# Patient Record
Sex: Male | Born: 1955
Health system: Southern US, Community
[De-identification: ages and names within clinical notes are randomized; demographics above are authoritative.]

## PROBLEM LIST (undated history)

## (undated) DIAGNOSIS — E079 Disorder of thyroid, unspecified: Secondary | ICD-10-CM

## (undated) DIAGNOSIS — I10 Essential (primary) hypertension: Secondary | ICD-10-CM

## (undated) DIAGNOSIS — E119 Type 2 diabetes mellitus without complications: Secondary | ICD-10-CM

## (undated) HISTORY — DX: Type 2 diabetes mellitus without complications: E11.9

## (undated) HISTORY — PX: HERNIA REPAIR: SHX51

## (undated) HISTORY — PX: BACK SURGERY: SHX140

## (undated) HISTORY — DX: Disorder of thyroid, unspecified: E07.9

---

## 2002-09-22 ENCOUNTER — Emergency Department (HOSPITAL_COMMUNITY): Admission: EM | Admit: 2002-09-22 | Discharge: 2002-09-22 | Payer: Self-pay | Admitting: Emergency Medicine

## 2002-09-22 ENCOUNTER — Encounter: Payer: Self-pay | Admitting: Emergency Medicine

## 2005-05-05 ENCOUNTER — Emergency Department (HOSPITAL_COMMUNITY): Admission: EM | Admit: 2005-05-05 | Discharge: 2005-05-05 | Payer: Self-pay | Admitting: Emergency Medicine

## 2005-07-04 ENCOUNTER — Emergency Department (HOSPITAL_COMMUNITY): Admission: EM | Admit: 2005-07-04 | Discharge: 2005-07-04 | Payer: Self-pay | Admitting: Emergency Medicine

## 2005-12-27 ENCOUNTER — Ambulatory Visit (HOSPITAL_COMMUNITY): Admission: RE | Admit: 2005-12-27 | Discharge: 2005-12-27 | Payer: Self-pay | Admitting: General Surgery

## 2005-12-27 ENCOUNTER — Encounter (INDEPENDENT_AMBULATORY_CARE_PROVIDER_SITE_OTHER): Payer: Self-pay | Admitting: Specialist

## 2006-04-02 ENCOUNTER — Emergency Department (HOSPITAL_COMMUNITY): Admission: EM | Admit: 2006-04-02 | Discharge: 2006-04-02 | Payer: Self-pay | Admitting: Emergency Medicine

## 2007-09-20 ENCOUNTER — Emergency Department (HOSPITAL_COMMUNITY): Admission: EM | Admit: 2007-09-20 | Discharge: 2007-09-20 | Payer: Self-pay | Admitting: Emergency Medicine

## 2008-01-11 ENCOUNTER — Emergency Department (HOSPITAL_COMMUNITY): Admission: EM | Admit: 2008-01-11 | Discharge: 2008-01-12 | Payer: Self-pay | Admitting: Emergency Medicine

## 2008-07-20 ENCOUNTER — Emergency Department (HOSPITAL_COMMUNITY): Admission: EM | Admit: 2008-07-20 | Discharge: 2008-07-20 | Payer: Self-pay | Admitting: Emergency Medicine

## 2009-09-24 ENCOUNTER — Emergency Department (HOSPITAL_COMMUNITY): Admission: EM | Admit: 2009-09-24 | Discharge: 2009-09-24 | Payer: Self-pay | Admitting: Emergency Medicine

## 2010-12-06 ENCOUNTER — Emergency Department (HOSPITAL_COMMUNITY)
Admission: EM | Admit: 2010-12-06 | Discharge: 2010-12-06 | Disposition: A | Discharge status: Home / Self Care | Payer: Self-pay | Attending: Emergency Medicine | Admitting: Emergency Medicine

## 2010-12-06 ENCOUNTER — Emergency Department (HOSPITAL_COMMUNITY): Payer: Self-pay

## 2010-12-06 DIAGNOSIS — I10 Essential (primary) hypertension: Secondary | ICD-10-CM | POA: Insufficient documentation

## 2010-12-06 DIAGNOSIS — X503XXA Overexertion from repetitive movements, initial encounter: Secondary | ICD-10-CM | POA: Insufficient documentation

## 2010-12-06 DIAGNOSIS — IMO0002 Reserved for concepts with insufficient information to code with codable children: Secondary | ICD-10-CM | POA: Insufficient documentation

## 2010-12-07 NOTE — H&P (Signed)
[name], Evans                 ACCOUNT NO.:  [id number]   MEDICAL RECORD NO.:  [id number]          PATIENT TYPE:  AMB   LOCATION:  DAY                           FACILITY:  [facility]   PHYSICIAN:  Charles Evans, M.D.   DATE OF BIRTH:  [birth year]   DATE OF ADMISSION:  DATE OF DISCHARGE:  LH                                HISTORY & PHYSICAL   HISTORY OF PRESENT ILLNESS:  A 55 year old male with history of right  inguinal swelling for at least two months.  The swelling started while the  patient was doing some lifting on his job.  He was seen by the nurse and was  told that he had a groin strain.  He has had continued swelling.  He was  seen by Dr. Ouida Sills on Dec 17, 2005 and it was noted that he had a right  inguinal hernia.  He is referred for hernia repair.   PAST MEDICAL HISTORY:  1.  History of hypertension.  2.  Osteoarthritis.   MEDICATIONS:  Hydrochlorothiazide 12.5 mg daily, but the patient does not  take it regularly.   PAST SURGICAL HISTORY:  None.   ALLERGIES:  PENICILLIN.   SOCIAL HISTORY:  The patient is married.  He is employed.  He smokes a pack  of cigarettes per day.  He denies drug and alcohol use.   FAMILY HISTORY:  Positive for hypertension and cirrhosis of the liver.   PHYSICAL EXAMINATION:  VITAL SIGNS:  Blood pressure 136/90, pulse 66,  respirations 20, weight 156 pounds.  HEENT:  Unremarkable.  NECK:  Supple.  No JVD, bruits, adenopathy, or thyromegaly.  CHEST:  Clear to auscultation.  HEART:  Regular rate and rhythm without murmur, gallop or rub.  ABDOMEN:  Soft, nontender.  No masses apparent.  Inguinal area reveals a  large right inguinal hernia.  EXTREMITIES:  No cyanosis, clubbing or edema.  NEUROLOGIC:  No focal motor, sensory or cerebellar deficits.   IMPRESSION:  1.  Right inguinal hernia.  2.  Hypertension.   PLAN:  Right inguinal hernia repair.      Charles Evans, M.D.  Electronically Signed     LCS/MEDQ  D:  12/27/2005  T:   12/27/2005  Job:  [id number]

## 2010-12-07 NOTE — Op Note (Signed)
NAMELADAVION, SAVITZ                 ACCOUNT NO.:  [id number]   MEDICAL RECORD NO.:  [id number]          PATIENT TYPE:  AMB   LOCATION:  DAY                           FACILITY:  [facility]   PHYSICIAN:  Jerolyn Shin C. Katrinka Blazing, M.D.   DATE OF BIRTH:  15-Aug-1955   DATE OF PROCEDURE:  12/27/2005  DATE OF DISCHARGE:                                 OPERATIVE REPORT   PREOPERATIVE DIAGNOSIS:  Right inguinal hernia .   POSTOPERATIVE DIAGNOSIS:  Right indirect inguinal hernia.   PROCEDURE:  Right inguinal hernia repair.   SURGEON:  Elpidio Anis, M.D.   DESCRIPTION:  Under general anesthesia, the right inguinal area was prepped  and draped in a sterile field.  A curvilinear incision was made and extended  down through the aponeurosis.  The cord was mobilized.  Inspection of the  cord was carried out, and a large sac, which communicated with the testicle,  was encountered.  The sac was very thin.  It took very meticulous dissection  to separate the sac from the cord.  The sac was dissected back to the  internal ring.  Part of it was excised.  The remaining part was doubly tied  with double 2-0 Vicryl and excised.  The sac remnant was invaginated, and a  medium plug was placed.  The medium plug was sutured with 4-0 sutures of  interrupted 0 Prolene.  An onlay patch was placed and was sutured  circumferentially to the inguinal floor with running 0 Prolene.  The cord  was placed in anatomic position.  Aponeurosis was closed with 3-0 Monocryl.  The subcutaneous tissue closed with 3-0 Monocryl.  The skin was closed with  subcuticular 4-0 Vicryl.  Local infiltration with 0.5% Marcaine with  epinephrine was carried out.  Dermabond was placed over the wound.  The  patient was awakened from anesthesia, transferred to a bed, and taken to the  post anesthesia care unit for monitoring.      Dirk Dress. Katrinka Blazing, M.D.  Electronically Signed     LCS/MEDQ  D:  12/27/2005  T:  12/28/2005  Job:  [id number]

## 2010-12-07 NOTE — Procedures (Signed)
NAMEDEMITRIOUS, MCCANNON                 ACCOUNT NO.:  [id number]   MEDICAL RECORD NO.:  [id number]          PATIENT TYPE:  AMB   LOCATION:  DAY                           FACILITY:  [facility]   PHYSICIAN:  Edward L. Juanetta Gosling, M.D.DATE OF BIRTH:  09/25/1955   DATE OF PROCEDURE:  12/27/2005  DATE OF DISCHARGE:                                EKG INTERPRETATION   The rhythm is sinus rhythm with a rate in the 60s.  There is significant  left ventricular hypertrophy by voltage criteria.  Anteriorly, there are ST  elevations which may be related to the LVH but could be due to injury, and  clinical correlation is suggested.  There may be left and right atrial  enlargement.  Abnormal electrocardiogram.      Oneal Deputy. Juanetta Gosling, M.D.  Electronically Signed     ELH/MEDQ  D:  12/27/2005  T:  12/28/2005  Job:  [id number]

## 2011-03-19 ENCOUNTER — Encounter: Payer: Self-pay | Admitting: Emergency Medicine

## 2011-03-19 DIAGNOSIS — I10 Essential (primary) hypertension: Secondary | ICD-10-CM | POA: Insufficient documentation

## 2011-03-19 DIAGNOSIS — R21 Rash and other nonspecific skin eruption: Secondary | ICD-10-CM | POA: Insufficient documentation

## 2011-03-19 DIAGNOSIS — D1739 Benign lipomatous neoplasm of skin and subcutaneous tissue of other sites: Secondary | ICD-10-CM | POA: Insufficient documentation

## 2011-03-19 DIAGNOSIS — T622X1A Toxic effect of other ingested (parts of) plant(s), accidental (unintentional), initial encounter: Secondary | ICD-10-CM | POA: Insufficient documentation

## 2011-03-19 DIAGNOSIS — F17200 Nicotine dependence, unspecified, uncomplicated: Secondary | ICD-10-CM | POA: Insufficient documentation

## 2011-03-19 NOTE — ED Notes (Signed)
Patient c/o swelling to the left side of his nose that started around noon today.  Patient denies pain. Patient also c/o "knot" on his back.  Soft lump noted to upper-mid back.

## 2011-03-20 ENCOUNTER — Emergency Department (HOSPITAL_COMMUNITY)
Admission: EM | Admit: 2011-03-20 | Discharge: 2011-03-20 | Disposition: A | Discharge status: Home / Self Care | Payer: Self-pay | Attending: Emergency Medicine | Admitting: Emergency Medicine

## 2011-03-20 DIAGNOSIS — L237 Allergic contact dermatitis due to plants, except food: Secondary | ICD-10-CM

## 2011-03-20 HISTORY — DX: Essential (primary) hypertension: I10

## 2011-03-20 MED ORDER — DIPHENHYDRAMINE HCL 25 MG PO CAPS
25.0000 mg | ORAL_CAPSULE | Freq: Once | ORAL | Status: AC
  Administered 2011-03-20: 25 mg via ORAL
  Filled 2011-03-20: qty 1

## 2011-03-20 MED ORDER — PREDNISONE 20 MG PO TABS
60.0000 mg | ORAL_TABLET | Freq: Once | ORAL | Status: AC
  Administered 2011-03-20: 60 mg via ORAL
  Filled 2011-03-20: qty 3

## 2011-03-20 MED ORDER — PREDNISONE 10 MG PO TABS: 10.0000 mg | ORAL_TABLET | Freq: Every day | ORAL | Status: AC

## 2011-03-20 NOTE — ED Provider Notes (Signed)
History     CSN: [id number] Arrival date & time: 03/20/2011 12:38 AM  Chief Complaint  Patient presents with  . Facial Swelling   HPI Comments: Seen 0130.  Patient is a 55 y.o. male presenting with allergic reaction. History provided by: Patient was working outdoors yesterday. Noticed tonight, swelling to left cheek with mild itching.. Did have exposure to poison ivy.  Allergic Reaction The primary symptoms are  rash. The current episode started 3 to 5 hours ago. The problem has not changed since onset. The rash is associated with itching.  The onset of the reaction was associated with exposure to plants. Significant symptoms also include itching.    Past Medical History  Diagnosis Date  . Hypertension     Past Surgical History  Procedure Date  . Hernia repair     No family history on file.  History  Substance Use Topics  . Smoking status: Current Everyday Smoker -- 1.0 packs/day    Types: Cigarettes  . Smokeless tobacco: Not on file  . Alcohol Use: No      Review of Systems  Skin: Positive for itching and rash.       Itching to left face.  All other systems reviewed and are negative.    Physical Exam  BP 152/104  Pulse 74  Temp(Src) 98.5 F (36.9 C) (Oral)  Resp 18  Ht 6\' 4"  (1.93 m)  Wt 193 lb (87.544 kg)  BMI 23.49 kg/m2  SpO2 97%  Physical Exam  Nursing note and vitals reviewed. Constitutional: He is oriented to person, place, and time. He appears well-developed and well-nourished.  HENT:  Head: Normocephalic and atraumatic.       Patchy rash to left cheek and left side of nose, raised, small papules, no weeping or exudate.  Eyes: EOM are normal. Pupils are equal, round, and reactive to light.  Neck: Normal range of motion. Neck supple.  Cardiovascular: Normal rate, normal heart sounds and intact distal pulses.   Pulmonary/Chest: Effort normal and breath sounds normal.  Musculoskeletal: Normal range of motion.  Neurological: He is alert and  oriented to person, place, and time.  Skin: Skin is warm and dry.       See rash description under HENT. Patient also has a large lipoma to his right back. No tenderness, erythema.    ED Course  Procedures  Patient with recent exposure to poison ivy now with itchy red rash to face c/w poison ivy reaction. Given prednisone and benedryl. Rx for prednisone. MDM Reviewed: nursing note and vitals        Nicoletta Dress. Colon Branch, MD 03/20/11 [phone]

## 2011-03-20 NOTE — ED Notes (Signed)
Pt self ambulated out of the er stating no needs

## 2011-04-29 ENCOUNTER — Emergency Department (HOSPITAL_COMMUNITY): Payer: Self-pay

## 2011-04-29 ENCOUNTER — Emergency Department (HOSPITAL_COMMUNITY)
Admission: EM | Admit: 2011-04-29 | Discharge: 2011-04-29 | Disposition: A | Discharge status: Home / Self Care | Payer: Self-pay | Attending: Emergency Medicine | Admitting: Emergency Medicine

## 2011-04-29 ENCOUNTER — Encounter (HOSPITAL_COMMUNITY): Payer: Self-pay | Admitting: Emergency Medicine

## 2011-04-29 DIAGNOSIS — J029 Acute pharyngitis, unspecified: Secondary | ICD-10-CM | POA: Insufficient documentation

## 2011-04-29 DIAGNOSIS — R059 Cough, unspecified: Secondary | ICD-10-CM | POA: Insufficient documentation

## 2011-04-29 DIAGNOSIS — R062 Wheezing: Secondary | ICD-10-CM | POA: Insufficient documentation

## 2011-04-29 DIAGNOSIS — R599 Enlarged lymph nodes, unspecified: Secondary | ICD-10-CM | POA: Insufficient documentation

## 2011-04-29 DIAGNOSIS — R05 Cough: Secondary | ICD-10-CM | POA: Insufficient documentation

## 2011-04-29 DIAGNOSIS — F17200 Nicotine dependence, unspecified, uncomplicated: Secondary | ICD-10-CM | POA: Insufficient documentation

## 2011-04-29 DIAGNOSIS — J40 Bronchitis, not specified as acute or chronic: Secondary | ICD-10-CM | POA: Insufficient documentation

## 2011-04-29 LAB — RAPID STREP SCREEN (MED CTR MEBANE ONLY): Streptococcus, Group A Screen (Direct): NEGATIVE

## 2011-04-29 MED ORDER — TRAMADOL HCL 50 MG PO TABS: 50.0000 mg | ORAL_TABLET | Freq: Four times a day (QID) | ORAL | Status: AC | PRN

## 2011-04-29 MED ORDER — DOXYCYCLINE HYCLATE 100 MG PO CAPS: 100.0000 mg | ORAL_CAPSULE | Freq: Two times a day (BID) | ORAL | Status: AC

## 2011-04-29 NOTE — ED Notes (Signed)
Patient c/o sore throat and cough since Saturday.  Patient states only brings up a little when he coughs.

## 2011-04-29 NOTE — ED Provider Notes (Addendum)
Scribed for Benny Lennert, MD, the patient was seen in room [facility] . This chart was scribed by Ellie Lunch. This patient's care was started at 10:41 PM.   CSN: [id number] Arrival date & time: 04/29/2011  9:42 PM  Chief Complaint  Patient presents with  . Sore Throat  . Cough    (Consider location/radiation/quality/duration/timing/severity/associated sxs/prior treatment) HPI Charles Evans is a 55 y.o. male who presents to the Emergency Department complaining of sore throat with assocaited cough starting 2 days ago. Pt reports cough is slightly productive. Also c/o pain to the left side of his neck described as a soreness.    Past Medical History  Diagnosis Date  . Hypertension     Past Surgical History  Procedure Date  . Hernia repair     No family history on file.  History  Substance Use Topics  . Smoking status: Current Everyday Smoker -- 1.0 packs/day    Types: Cigarettes  . Smokeless tobacco: Not on file  . Alcohol Use: No    Review of Systems  Constitutional: Negative for fatigue.  HENT: Positive for sore throat. Negative for congestion, sinus pressure and ear discharge.   Eyes: Negative for discharge.  Respiratory: Positive for cough.   Cardiovascular: Negative for chest pain.  Gastrointestinal: Negative for abdominal pain and diarrhea.  Genitourinary: Negative for frequency and hematuria.  Musculoskeletal: Negative for back pain.  Skin: Negative for rash.  Neurological: Negative for seizures and headaches.  Hematological: Negative.   Psychiatric/Behavioral: Negative for hallucinations.    Allergies  Penicillins  Home Medications   Current Outpatient Rx  Name Route Sig Dispense Refill  . ASPIRIN 81 MG PO TABS Oral Take 81 mg by mouth daily.      Marland Kitchen LISINOPRIL PO Oral Take 1 tablet by mouth daily.      Marland Kitchen PSEUDOEPHEDRINE-ACETAMINOPHEN 30-500 MG PO TABS Oral Take 1 tablet by mouth every 4 (four) hours as needed.        BP 155/95  Pulse 87   Temp(Src) 100.3 F (37.9 C) (Oral)  Resp 16  Ht 6\' 4"  (1.93 m)  Wt 195 lb (88.451 kg)  BMI 23.74 kg/m2  SpO2 99%  Physical Exam  Constitutional: He is oriented to person, place, and time. He appears well-developed.  HENT:  Head: Normocephalic and atraumatic.       Pharynx inflamed, no exudate   Eyes: Conjunctivae and EOM are normal. No scleral icterus.  Neck: Neck supple. No thyromegaly present.       Tender lymphnode left lateral neck.   Cardiovascular: Normal rate and regular rhythm.  Exam reveals no gallop and no friction rub.   No murmur heard. Pulmonary/Chest: No stridor. He has wheezes (bilateral wheezes). He has no rales. He exhibits no tenderness.  Abdominal: He exhibits no distension. There is no tenderness. There is no rebound.  Musculoskeletal: Normal range of motion. He exhibits no edema.  Lymphadenopathy:    He has cervical adenopathy.  Neurological: He is oriented to person, place, and time. Coordination normal.  Skin: No rash noted. No erythema.  Psychiatric: He has a normal mood and affect. His behavior is normal.   Pharynx inflamed no exudate tender lymphnode left lateral neck. Wheezing in lungs.  Procedures (including critical care time)  OTHER DATA REVIEWED: Nursing notes, vital signs, and past medical records reviewed.  DIAGNOSTIC STUDIES: Oxygen Saturation is 99% on room air, normal by my interpretation.    LABS / RADIOLOGY:  Results for orders placed during the  hospital encounter of 04/29/11  RAPID STREP SCREEN      Component Value Range   Streptococcus, Group A Screen (Direct) NEGATIVE  NEGATIVE    ED COURSE / COORDINATION OF CARE: 23:27 Chest xray reviewed. Pt recheck. Plan to discharge.  ZOX:[id number] and pharyngitis  DISCHARGE MEDICATIONS: New Prescriptions   DOXYCYCLINE (VIBRAMYCIN) 100 MG CAPSULE    Take 1 capsule (100 mg total) by mouth 2 (two) times daily.   TRAMADOL (ULTRAM) 50 MG TABLET    Take 1 tablet (50 mg total) by mouth every 6  (six) hours as needed for pain.   Results for orders placed during the hospital encounter of 04/29/11  RAPID STREP SCREEN      Component Value Range   Streptococcus, Group A Screen (Direct) NEGATIVE  NEGATIVE    Dg Chest 2 View  04/29/2011  *RADIOLOGY REPORT*  Clinical Data: Cough, chills.  CHEST - 2 VIEW  Comparison: None.  Findings: Mild linear left lung base opacity is favored to represent scarring or atelectasis.  No other focal areas of consolidation.  Mild hyperinflation.  Cardiomediastinal contours are within normal limits.  No pleural effusion or pneumothorax.  No acute osseous abnormality.  IMPRESSION: Mild linear left lung base opacity is favored to represent scarring or atelectasis.  No other focal areas of consolidation.  Original Report Authenticated By: Waneta Martins, M.D.      SCRIBE ATTESTATION: The chart was scribed for me under my direct supervision.  I personally performed the history, physical, and medical decision making and all procedures in the evaluation of this patient.Benny Lennert, MD 04/29/11 1191  Benny Lennert, MD 04/29/11 [phone]

## 2011-04-29 NOTE — ED Notes (Signed)
Pt reports sore throat began on Sat.  Also reports some pain in left ear.  No distress noted.

## 2011-05-05 ENCOUNTER — Emergency Department (HOSPITAL_COMMUNITY)
Admission: EM | Admit: 2011-05-05 | Discharge: 2011-05-05 | Disposition: A | Discharge status: Home / Self Care | Payer: Self-pay | Attending: Emergency Medicine | Admitting: Emergency Medicine

## 2011-05-05 ENCOUNTER — Encounter (HOSPITAL_COMMUNITY): Payer: Self-pay | Admitting: *Deleted

## 2011-05-05 DIAGNOSIS — R51 Headache: Secondary | ICD-10-CM | POA: Insufficient documentation

## 2011-05-05 DIAGNOSIS — F17200 Nicotine dependence, unspecified, uncomplicated: Secondary | ICD-10-CM | POA: Insufficient documentation

## 2011-05-05 DIAGNOSIS — I10 Essential (primary) hypertension: Secondary | ICD-10-CM | POA: Insufficient documentation

## 2011-05-05 MED ORDER — HYDROMORPHONE HCL 1 MG/ML IJ SOLN
1.0000 mg | Freq: Once | INTRAMUSCULAR | Status: AC
  Administered 2011-05-05: 1 mg via INTRAMUSCULAR
  Filled 2011-05-05: qty 1

## 2011-05-05 MED ORDER — ONDANSETRON 4 MG PO TBDP
4.0000 mg | ORAL_TABLET | Freq: Once | ORAL | Status: AC
  Administered 2011-05-05: 4 mg via ORAL
  Filled 2011-05-05: qty 1

## 2011-05-05 MED ORDER — DIPHENHYDRAMINE HCL 50 MG/ML IJ SOLN
50.0000 mg | Freq: Once | INTRAMUSCULAR | Status: AC
  Administered 2011-05-05: 50 mg via INTRAMUSCULAR
  Filled 2011-05-05: qty 1

## 2011-05-05 MED ORDER — METHYLPREDNISOLONE SODIUM SUCC 125 MG IJ SOLR
125.0000 mg | Freq: Once | INTRAMUSCULAR | Status: AC
  Administered 2011-05-05: 125 mg via INTRAMUSCULAR
  Filled 2011-05-05: qty 2

## 2011-05-05 NOTE — ED Provider Notes (Signed)
History     CSN: [id number] Arrival date & time: 05/05/2011 11:41 AM  Chief Complaint  Patient presents with  . Headache    (Consider location/radiation/quality/duration/timing/severity/associated sxs/prior treatment) HPI Comments: States the headache began 5 days ago; one day after the onset of sore throat.Marland Kitchen  He is currently on abx for sore throat which has improved.  Denies h/o headache.    Patient is a 55 y.o. male presenting with headaches. The history is provided by the patient. No language interpreter was used.  Headache  This is a new problem. The problem occurs constantly. The problem has not changed since onset.The pain is located in the right unilateral and temporal region. The quality of the pain is described as dull. The pain is at a severity of 7/10. The pain does not radiate. Pertinent negatives include no fever, no nausea and no vomiting. Treatments tried: taking tramadol with no relief.    Past Medical History  Diagnosis Date  . Hypertension     Past Surgical History  Procedure Date  . Hernia repair     No family history on file.  History  Substance Use Topics  . Smoking status: Current Everyday Smoker -- 1.0 packs/day    Types: Cigarettes  . Smokeless tobacco: Not on file  . Alcohol Use: No      Review of Systems  Constitutional: Negative for fever and chills.  Gastrointestinal: Negative for nausea and vomiting.  Neurological: Positive for headaches.  All other systems reviewed and are negative.    Allergies  Penicillins  Home Medications   Current Outpatient Rx  Name Route Sig Dispense Refill  . ASPIRIN 81 MG PO TABS Oral Take 81 mg by mouth daily.      Marland Kitchen DOXYCYCLINE HYCLATE 100 MG PO CAPS Oral Take 1 capsule (100 mg total) by mouth 2 (two) times daily. 20 capsule 0  . LISINOPRIL 10 MG PO TABS Oral Take 10 mg by mouth daily.      Marland Kitchen PSEUDOEPHEDRINE-ACETAMINOPHEN 30-500 MG PO TABS Oral Take 1 tablet by mouth every 4 (four) hours as needed.  For congestion    . TRAMADOL HCL 50 MG PO TABS Oral Take 1 tablet (50 mg total) by mouth every 6 (six) hours as needed for pain. 15 tablet 0  . LISINOPRIL PO Oral Take 1 tablet by mouth daily.        BP 141/85  Pulse 70  Temp(Src) 98.1 F (36.7 C) (Oral)  Resp 16  Ht 6\' 4"  (1.93 m)  Wt 195 lb (88.451 kg)  BMI 23.74 kg/m2  SpO2 99%  Physical Exam  Nursing note and vitals reviewed. Constitutional: He is oriented to person, place, and time. He appears well-developed and well-nourished.  HENT:  Head: Normocephalic and atraumatic.  Eyes: EOM are normal.  Neck: Normal range of motion.  Cardiovascular: Normal rate, regular rhythm, normal heart sounds and intact distal pulses.   Pulmonary/Chest: Effort normal and breath sounds normal. No respiratory distress.  Abdominal: Soft. He exhibits no distension. There is no tenderness.  Musculoskeletal: Normal range of motion.  Neurological: He is alert and oriented to person, place, and time. He displays normal reflexes. No cranial nerve deficit or sensory deficit. Coordination normal. GCS eye subscore is 4. GCS verbal subscore is 5. GCS motor subscore is 6.  Reflex Scores:      Tricep reflexes are 2+ on the right side and 2+ on the left side.      Bicep reflexes are 2+ on the right  side and 2+ on the left side.      Brachioradialis reflexes are 2+ on the right side and 2+ on the left side.      Patellar reflexes are 2+ on the right side and 2+ on the left side.      Achilles reflexes are 2+ on the right side and 2+ on the left side. Skin: Skin is warm and dry.  Psychiatric: He has a normal mood and affect. Judgment normal.    ED Course  Procedures (including critical care time)  Labs Reviewed - No data to display No results found.   No diagnosis found.    MDM  Pt prefers not having head CT today.  Will return if sxs worsen.        Worthy Rancher, PA 05/05/11 1310

## 2011-05-05 NOTE — ED Notes (Signed)
Pt states he was seen for same a week ago. Pt states he was dx with a virus but continues to have headaches, nausea, and neck pain. Denies hx of migraines. Rx of Tramadol not relieving the pain, per pt. Headache ongoing x 1 week.

## 2011-05-05 NOTE — ED Provider Notes (Signed)
Medical screening examination/treatment/procedure(s) were performed by non-physician practitioner and as supervising physician I was immediately available for consultation/collaboration.  Analaya Hoey, MD 05/05/11 1829 

## 2011-05-05 NOTE — ED Notes (Signed)
Patient with HA x 1 week, denies anything that relieves his HA including the pain med given last visit for same complaint, grips equal, denies any vision changes, does admit to some dizziness and not able to think clearly

## 2012-05-28 ENCOUNTER — Encounter (HOSPITAL_COMMUNITY): Payer: Self-pay | Admitting: *Deleted

## 2012-05-28 ENCOUNTER — Observation Stay (HOSPITAL_COMMUNITY)
Admission: EM | Admit: 2012-05-28 | Discharge: 2012-05-29 | Discharge status: Against Medical Advice | Payer: Self-pay | Attending: Emergency Medicine | Admitting: Emergency Medicine

## 2012-05-28 ENCOUNTER — Emergency Department (HOSPITAL_COMMUNITY): Payer: Self-pay

## 2012-05-28 DIAGNOSIS — I10 Essential (primary) hypertension: Secondary | ICD-10-CM | POA: Diagnosis present

## 2012-05-28 DIAGNOSIS — E876 Hypokalemia: Secondary | ICD-10-CM | POA: Diagnosis present

## 2012-05-28 DIAGNOSIS — R739 Hyperglycemia, unspecified: Secondary | ICD-10-CM | POA: Diagnosis present

## 2012-05-28 DIAGNOSIS — R7309 Other abnormal glucose: Secondary | ICD-10-CM | POA: Insufficient documentation

## 2012-05-28 DIAGNOSIS — I4891 Unspecified atrial fibrillation: Secondary | ICD-10-CM | POA: Diagnosis present

## 2012-05-28 DIAGNOSIS — R079 Chest pain, unspecified: Principal | ICD-10-CM | POA: Diagnosis present

## 2012-05-28 DIAGNOSIS — F17200 Nicotine dependence, unspecified, uncomplicated: Secondary | ICD-10-CM | POA: Insufficient documentation

## 2012-05-28 NOTE — ED Notes (Signed)
Pt reports sudden on set of chest pain that went across his chest. States pain has eased up some now.

## 2012-05-29 ENCOUNTER — Encounter (HOSPITAL_COMMUNITY): Payer: Self-pay | Admitting: Internal Medicine

## 2012-05-29 DIAGNOSIS — R079 Chest pain, unspecified: Secondary | ICD-10-CM | POA: Diagnosis present

## 2012-05-29 DIAGNOSIS — R739 Hyperglycemia, unspecified: Secondary | ICD-10-CM | POA: Diagnosis present

## 2012-05-29 DIAGNOSIS — I10 Essential (primary) hypertension: Secondary | ICD-10-CM | POA: Diagnosis present

## 2012-05-29 DIAGNOSIS — R7309 Other abnormal glucose: Secondary | ICD-10-CM

## 2012-05-29 DIAGNOSIS — E876 Hypokalemia: Secondary | ICD-10-CM | POA: Diagnosis present

## 2012-05-29 DIAGNOSIS — I4891 Unspecified atrial fibrillation: Secondary | ICD-10-CM | POA: Diagnosis present

## 2012-05-29 LAB — BASIC METABOLIC PANEL
BUN: 19 mg/dL (ref 6–23)
Calcium: 9.8 mg/dL (ref 8.4–10.5)
GFR calc non Af Amer: 71 mL/min — ABNORMAL LOW (ref >90)
Glucose, Bld: 194 mg/dL — ABNORMAL HIGH (ref 70–99)
Sodium: 140 mEq/L (ref 135–145)

## 2012-05-29 LAB — CBC WITH DIFFERENTIAL/PLATELET
Eosinophils Absolute: 0 10*3/uL (ref 0.0–0.7)
Eosinophils Relative: 0 % (ref 0–5)
Lymphs Abs: 1.7 10*3/uL (ref 0.7–4.0)
MCH: 30.4 pg (ref 26.0–34.0)
MCV: 88.4 fL (ref 78.0–100.0)
Platelets: 203 10*3/uL (ref 150–400)
RBC: 5.42 MIL/uL (ref 4.22–5.81)

## 2012-05-29 LAB — POCT I-STAT TROPONIN I

## 2012-05-29 LAB — MAGNESIUM: Magnesium: 1.7 mg/dL (ref 1.5–2.5)

## 2012-05-29 MED ORDER — SODIUM CHLORIDE 0.9 % IV BOLUS (SEPSIS)
500.0000 mL | Freq: Once | INTRAVENOUS | Status: AC
  Administered 2012-05-29: 500 mL via INTRAVENOUS

## 2012-05-29 MED ORDER — DILTIAZEM HCL 100 MG IV SOLR
5.0000 mg/h | Freq: Once | INTRAVENOUS | Status: AC
  Administered 2012-05-29: 5 mg/h via INTRAVENOUS
  Filled 2012-05-29: qty 100

## 2012-05-29 MED ORDER — DILTIAZEM HCL 50 MG/10ML IV SOLN
10.0000 mg | Freq: Once | INTRAVENOUS | Status: AC
  Administered 2012-05-29: 10 mg via INTRAVENOUS
  Filled 2012-05-29: qty 2

## 2012-05-29 MED ORDER — DILTIAZEM HCL 25 MG/5ML IV SOLN
INTRAVENOUS | Status: AC
  Administered 2012-05-29: 10 mg via INTRAVENOUS
  Filled 2012-05-29: qty 5

## 2012-05-29 MED ORDER — POTASSIUM CHLORIDE CRYS ER 20 MEQ PO TBCR: 40.0000 meq | EXTENDED_RELEASE_TABLET | Freq: Once | ORAL | Status: DC

## 2012-05-29 NOTE — ED Notes (Signed)
Advised by Dr Rito Ehrlich by was not staying & signing out AMA.

## 2012-05-29 NOTE — ED Notes (Signed)
Pt left AMA. Pt & family informed not to hesitate to return if he began feeling worse or started having any other symptoms.

## 2012-05-29 NOTE — ED Notes (Signed)
Spoke w/ pt about admission. Pt states that he was not going to stay. This nurse spoke w/ admitting Dr about pt not want to stay. Spoke to pt about the risk in leaving & pt insist he is going. Dr Rito Ehrlich again advised & stated he would speak to the pt again.

## 2012-05-29 NOTE — H&P (Addendum)
[facility] History and Physical  Charles Evans ZOX:[id number] DOB: 15-Mar-1956 DOA: 05/28/2012   PCP: Health Department Specialists: None  Chief Complaint: Chest pain  HPI: Charles Evans is a 56 y.o. male with a past medical history of hypertension, who was in his usual state of health about 2 hours prior to arrival to the ED, when he started having pain in the retrosternal area. The pain spread to both sides of his chest. He felt like his heart was racing. The pain was 9/10 in intensity and was described as a pressure-like sensation. He was at his home and this happened. He had just finished his dinner and was walking inside his house. The pain was associated with shortness of breath and dizziness, but he did not have any syncopal episodes. He denied nausea or vomiting. He's never had these symptoms before. The chest pain lasted about 25 minutes and currently he is without any chest pain. Denies any recent illness, although he did go to the health department on Monday for shoulder pain and was prescribed prednisone. He denies any known history of heart disease. He's never had any cardiac testing in the past. There was no radiation of the pain. No precipitating, aggravating or relieving factors are identified. He does admit to some leg swelling once in a while.  Home Medications: Prior to Admission medications   Medication Sig Start Date End Date Taking? Authorizing Provider  aspirin 81 MG tablet Take 81 mg by mouth daily.     Yes Historical Provider, MD  lisinopril (PRINIVIL,ZESTRIL) 10 MG tablet Take 10 mg by mouth daily.     Yes Historical Provider, MD  pseudoephedrine-acetaminophen (TYLENOL SINUS) 30-500 MG TABS Take 1 tablet by mouth every 4 (four) hours as needed. For congestion   Yes Historical Provider, MD  UNABLE TO FIND Med Name: Taking Prednisone at this time unknown dose.   Yes Historical Provider, MD  LISINOPRIL PO Take 1 tablet by mouth daily.      Historical Provider, MD     Allergies:  Allergies  Allergen Reactions  . Penicillins Swelling and Rash    Past Medical History: Past Medical History  Diagnosis Date  . Hypertension     Past Surgical History  Procedure Date  . Hernia repair     Social History:  reports that he has been smoking Cigarettes.  He has been smoking about 1 pack per day. He does not have any smokeless tobacco history on file. He reports that he does not drink alcohol or use illicit drugs.  Living Situation: Lives in [place] with his daughter Activity Level: Independent with his daily activities   Family History:  Family History  Problem Relation Age of Onset  . Heart disease Father      Review of Systems - History obtained from the patient General ROS: negative Psychological ROS: negative Ophthalmic ROS: negative ENT ROS: negative Allergy and Immunology ROS: negative Hematological and Lymphatic ROS: negative Endocrine ROS: negative Respiratory ROS: as in hpi Cardiovascular ROS: as in hpi Gastrointestinal ROS: no abdominal pain, change in bowel habits, or black or bloody stools Genito-Urinary ROS: no dysuria, trouble voiding, or hematuria Musculoskeletal ROS: negative Neurological ROS: no TIA or stroke symptoms Dermatological ROS: negative  Physical Examination  Filed Vitals:   05/29/12 0020 05/29/12 0030 05/29/12 0100 05/29/12 0200  BP: 134/79 128/87 130/77 124/83  Pulse:   95 86  Temp:      TempSrc:      Resp: 16  Height:      Weight:      SpO2: 95% 94% 93% 94%    General appearance: alert, cooperative, appears stated age and no distress Head: Normocephalic, without obvious abnormality, atraumatic Eyes: conjunctivae/corneas clear. PERRL, EOM's intact.  Throat: lips, mucosa, and tongue normal; teeth and gums normal Neck: no adenopathy, no carotid bruit, no JVD, supple, symmetrical, trachea midline and thyroid not enlarged, symmetric, no tenderness/mass/nodules Resp: clear to auscultation  bilaterally Cardio: irregularly irregular. no murmur, click, rub or gallop GI: soft, non-tender; bowel sounds normal; no masses,  no organomegaly Extremities: extremities normal, atraumatic, no cyanosis or edema Pulses: 2+ and symmetric Skin: Skin color, texture, turgor normal. No rashes or lesions Lymph nodes: Cervical, supraclavicular, and axillary nodes normal. Neurologic: He is alert and oriented x3. No focal neurological deficits are present  Laboratory Data: Results for orders placed during the hospital encounter of 05/28/12 (from the past 48 hour(s))  CBC WITH DIFFERENTIAL     Status: Abnormal   Collection Time   05/28/12 11:45 PM      Component Value Range Comment   WBC 12.6 (*) 4.0 - 10.5 K/uL    RBC 5.42  4.22 - 5.81 MIL/uL    Hemoglobin 16.5  13.0 - 17.0 g/dL    HCT 40.9  81.1 - 91.4 %    MCV 88.4  78.0 - 100.0 fL    MCH 30.4  26.0 - 34.0 pg    MCHC 34.4  30.0 - 36.0 g/dL    RDW 78.2  95.6 - 21.3 %    Platelets 203  150 - 400 K/uL    Neutrophils Relative 82 (*) 43 - 77 %    Neutro Abs 10.4 (*) 1.7 - 7.7 K/uL    Lymphocytes Relative 14  12 - 46 %    Lymphs Abs 1.7  0.7 - 4.0 K/uL    Monocytes Relative 4  3 - 12 %    Monocytes Absolute 0.4  0.1 - 1.0 K/uL    Eosinophils Relative 0  0 - 5 %    Eosinophils Absolute 0.0  0.0 - 0.7 K/uL    Basophils Relative 0  0 - 1 %    Basophils Absolute 0.0  0.0 - 0.1 K/uL   BASIC METABOLIC PANEL     Status: Abnormal   Collection Time   05/28/12 11:45 PM      Component Value Range Comment   Sodium 140  135 - 145 mEq/L    Potassium 3.4 (*) 3.5 - 5.1 mEq/L    Chloride 101  96 - 112 mEq/L    CO2 27  19 - 32 mEq/L    Glucose, Bld 194 (*) 70 - 99 mg/dL    BUN 19  6 - 23 mg/dL    Creatinine, Ser 0.86  0.50 - 1.35 mg/dL    Calcium 9.8  8.4 - 57.8 mg/dL    GFR calc non Af Amer 71 (*) >90 mL/min    GFR calc Af Amer 83 (*) >90 mL/min   POCT I-STAT TROPONIN I     Status: Normal   Collection Time   05/28/12 11:52 PM      Component Value  Range Comment   Troponin i, poc 0.01  0.00 - 0.08 ng/mL    Comment 3              Radiology Reports: Dg Chest Portable 1 View  05/29/2012  *RADIOLOGY REPORT*  Clinical Data: Smoker with chest pain and shortness  of breath.  PORTABLE CHEST - 1 VIEW 05/28/2012 2355 hours:  Comparison: Two-view chest x-ray 04/24/2011, 09/24/2009.  Findings: Cardiac silhouette upper normal in size for the AP portable technique, unchanged.  Lungs clear.  Bronchovascular markings normal.  Pulmonary vascularity normal.  No pneumothorax. No pleural effusions.  IMPRESSION: No acute cardiopulmonary disease.   Original Report Authenticated By: Hulan Saas, M.D.     Electrocardiogram: Independently reviewed: Initial EKG showed atrial fibrillation at a rate of 115 beats per minute. Nonspecific T-wave changes are present. Subsequent EKG shows again, A. Fib, this time at 77 beats per minute. T inversions have resolved. No acute ischemic changes are noted.  Assessment/Plan  Principal Problem:  *Atrial fibrillation Active Problems:  Chest pain  HTN (hypertension)  Hyperglycemia  Hypokalemia   #1 new onset atrial fibrillation: Etiology remains unclear. Chads2 score is 1 or 2 depending on whether he has diabetes or not. Echocardiogram will be ordered. TSH level will be checked. We will go ahead and consult cardiology. He'll be put on aspirin for now. Depending on further evaluation Chads2 score  will be updated and the patient may need to be on anticoagulation. He's currently on a Cardizem infusion, which will be continued. I suspect he will be able to be transitioned to oral medications soon.  #2 chest pain: Most likely secondary to the above. Troponins will be cycled. He will require some form of ischemic workup in the future.Lipid panel will be checked in the morning.  #3 hyperglycemia: HbA1c will be checked. A fasting glucose level will be checked.   #4 history of hypertension: Hold his ACE inhibitor for now. While  he is on the Cardizem infusion monitor blood pressures closely.  #5 hypokalemia: This will be repleted.  DVT, prophylaxis with enoxaparin  Code Status: Full code Family Communication: His daughter is at the bedside  Disposition Plan: We'll likely go home when he stable for discharge    [facility]  [facility] Pager [phone]  If 7PM-7AM, please contact night-coverage [email or web address] Password [facility]  05/29/2012, 2:25 AM   Patient informed me that he didn't want to get admitted. I advised him that it was unwise to leave without further workup and treatment as outlined. He was adamant about not wanting to stay. He wouldn't allow further discussions. Risks of leaving against medical advise were conveyed to patient including worsening of his condition and death. He understands. His daughter was at bedside ans she was unable to convince him either. He will leave against medical advise.  Adryana Mogensen 2:41 AM

## 2012-05-29 NOTE — ED Provider Notes (Signed)
History     CSN: [id number]  Arrival date & time 05/28/12  2331   First MD Initiated Contact with Patient 05/28/12 2342      Chief Complaint  Patient presents with  . Chest Pain    (Consider location/radiation/quality/duration/timing/severity/associated sxs/prior treatment) HPI....chest pain, shortness of breath since approximately 6:00 this evening. Smokes one half pack per day. Has history of hypertension. Works as a Curator. Nothing makes symptoms better or worse. Severity is moderate. No associated symptoms no radiation of chest pain  Past Medical History  Diagnosis Date  . Hypertension     Past Surgical History  Procedure Date  . Hernia repair     No family history on file.  History  Substance Use Topics  . Smoking status: Current Every Day Smoker -- 1.0 packs/day    Types: Cigarettes  . Smokeless tobacco: Not on file  . Alcohol Use: No      Review of Systems  All other systems reviewed and are negative.    Allergies  Penicillins  Home Medications   Current Outpatient Rx  Name  Route  Sig  Dispense  Refill  . ASPIRIN 81 MG PO TABS   Oral   Take 81 mg by mouth daily.           Marland Kitchen LISINOPRIL 10 MG PO TABS   Oral   Take 10 mg by mouth daily.           Marland Kitchen PSEUDOEPHEDRINE-ACETAMINOPHEN 30-500 MG PO TABS   Oral   Take 1 tablet by mouth every 4 (four) hours as needed. For congestion         . UNABLE TO FIND      Med Name: Taking Prednisone at this time unknown dose.         Marland Kitchen LISINOPRIL PO   Oral   Take 1 tablet by mouth daily.             BP 130/77  Pulse 95  Temp 97.8 F (36.6 C) (Oral)  Resp 16  Ht 6\' 4"  (1.93 m)  Wt 204 lb (92.534 kg)  BMI 24.83 kg/m2  SpO2 93%  Physical Exam  Nursing note and vitals reviewed. Constitutional: He is oriented to person, place, and time. He appears well-developed and well-nourished.  HENT:  Head: Normocephalic and atraumatic.  Eyes: Conjunctivae normal and EOM are normal. Pupils are  equal, round, and reactive to light.  Neck: Normal range of motion. Neck supple.  Cardiovascular: Normal heart sounds.        Tachycardic, irregularly irregular  Pulmonary/Chest: Effort normal and breath sounds normal.  Abdominal: Soft. Bowel sounds are normal.  Musculoskeletal: Normal range of motion.  Neurological: He is alert and oriented to person, place, and time.  Skin: Skin is warm and dry.  Psychiatric: He has a normal mood and affect.    ED Course  Procedures (including critical care time)  Labs Reviewed  CBC WITH DIFFERENTIAL - Abnormal; Notable for the following:    WBC 12.6 (*)     Neutrophils Relative 82 (*)     Neutro Abs 10.4 (*)     All other components within normal limits  BASIC METABOLIC PANEL - Abnormal; Notable for the following:    Potassium 3.4 (*)     Glucose, Bld 194 (*)     GFR calc non Af Amer 71 (*)     GFR calc Af Amer 83 (*)     All other components within normal limits  POCT  I-STAT TROPONIN I   Dg Chest Portable 1 View  05/29/2012  *RADIOLOGY REPORT*  Clinical Data: Smoker with chest pain and shortness of breath.  PORTABLE CHEST - 1 VIEW 05/28/2012 2355 hours:  Comparison: Two-view chest x-ray 04/24/2011, 09/24/2009.  Findings: Cardiac silhouette upper normal in size for the AP portable technique, unchanged.  Lungs clear.  Bronchovascular markings normal.  Pulmonary vascularity normal.  No pneumothorax. No pleural effusions.  IMPRESSION: No acute cardiopulmonary disease.   Original Report Authenticated By: Hulan Saas, M.D.      No diagnosis found.   Date: 05/29/2012  Rate: 115  Rhythm: atrial fibrillation  QRS Axis: normal  Intervals: normal  ST/T Wave abnormalities: inverted T waves inferiorly and laterally  Conduction Disutrbances:none  Narrative Interpretation:   Old EKG Reviewed: none available   MDM  Patient is in new-onset atrial fibrillation. He is hemodynamically stable. Rx IV Cardizem 10 mg bolus and 5 mg per hour drip.  Troponin negative. Admit to hospitalist        Donnetta Hutching, MD 05/29/12 [phone]

## 2012-12-05 ENCOUNTER — Emergency Department (HOSPITAL_COMMUNITY): Payer: Self-pay

## 2012-12-05 ENCOUNTER — Encounter (HOSPITAL_COMMUNITY): Payer: Self-pay

## 2012-12-05 ENCOUNTER — Emergency Department (HOSPITAL_COMMUNITY)
Admission: EM | Admit: 2012-12-05 | Discharge: 2012-12-05 | Disposition: A | Discharge status: Home / Self Care | Payer: Self-pay | Attending: Emergency Medicine | Admitting: Emergency Medicine

## 2012-12-05 DIAGNOSIS — I10 Essential (primary) hypertension: Secondary | ICD-10-CM | POA: Insufficient documentation

## 2012-12-05 DIAGNOSIS — R52 Pain, unspecified: Secondary | ICD-10-CM | POA: Insufficient documentation

## 2012-12-05 DIAGNOSIS — F17200 Nicotine dependence, unspecified, uncomplicated: Secondary | ICD-10-CM | POA: Insufficient documentation

## 2012-12-05 DIAGNOSIS — Z79899 Other long term (current) drug therapy: Secondary | ICD-10-CM | POA: Insufficient documentation

## 2012-12-05 DIAGNOSIS — Z880 Allergy status to penicillin: Secondary | ICD-10-CM | POA: Insufficient documentation

## 2012-12-05 DIAGNOSIS — M542 Cervicalgia: Secondary | ICD-10-CM | POA: Insufficient documentation

## 2012-12-05 DIAGNOSIS — M25511 Pain in right shoulder: Secondary | ICD-10-CM

## 2012-12-05 DIAGNOSIS — Z7982 Long term (current) use of aspirin: Secondary | ICD-10-CM | POA: Insufficient documentation

## 2012-12-05 DIAGNOSIS — M25519 Pain in unspecified shoulder: Secondary | ICD-10-CM | POA: Insufficient documentation

## 2012-12-05 MED ORDER — CYCLOBENZAPRINE HCL 10 MG PO TABS: 10.0000 mg | ORAL_TABLET | Freq: Two times a day (BID) | ORAL | Status: DC | PRN

## 2012-12-05 MED ORDER — KETOROLAC TROMETHAMINE 60 MG/2ML IM SOLN
60.0000 mg | Freq: Once | INTRAMUSCULAR | Status: AC
  Administered 2012-12-05: 60 mg via INTRAMUSCULAR
  Filled 2012-12-05: qty 2

## 2012-12-05 MED ORDER — HYDROCODONE-ACETAMINOPHEN 5-325 MG PO TABS: 1.0000 | ORAL_TABLET | ORAL | Status: DC | PRN

## 2012-12-05 MED ORDER — PREDNISONE 10 MG PO TABS: 20.0000 mg | ORAL_TABLET | Freq: Every day | ORAL | Status: DC
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
## 2012-12-05 NOTE — ED Provider Notes (Signed)
Medical screening examination/treatment/procedure(s) were performed by non-physician practitioner and as supervising physician I was immediately available for consultation/collaboration.  Geoffery Lyons, MD 12/05/12 [phone]

## 2012-12-05 NOTE — ED Notes (Signed)
Pt c/o right shoulder pain x3-4 weeks. 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

## 2012-12-05 NOTE — ED Notes (Signed)
Pt reports right shoulder pain x 4 weeks, thinks he may have torn his rotator cuff.

## 2013-06-24 ENCOUNTER — Encounter (HOSPITAL_COMMUNITY): Payer: Self-pay | Admitting: Emergency Medicine

## 2013-06-24 ENCOUNTER — Emergency Department (HOSPITAL_COMMUNITY)
Admission: EM | Admit: 2013-06-24 | Discharge: 2013-06-25 | Disposition: A | Discharge status: Home / Self Care | Payer: Self-pay | Attending: Emergency Medicine | Admitting: Emergency Medicine

## 2013-06-24 DIAGNOSIS — F17200 Nicotine dependence, unspecified, uncomplicated: Secondary | ICD-10-CM | POA: Insufficient documentation

## 2013-06-24 DIAGNOSIS — Z880 Allergy status to penicillin: Secondary | ICD-10-CM | POA: Insufficient documentation

## 2013-06-24 DIAGNOSIS — I10 Essential (primary) hypertension: Secondary | ICD-10-CM | POA: Insufficient documentation

## 2013-06-24 DIAGNOSIS — Z79899 Other long term (current) drug therapy: Secondary | ICD-10-CM | POA: Insufficient documentation

## 2013-06-24 DIAGNOSIS — Z7982 Long term (current) use of aspirin: Secondary | ICD-10-CM | POA: Insufficient documentation

## 2013-06-24 DIAGNOSIS — IMO0002 Reserved for concepts with insufficient information to code with codable children: Secondary | ICD-10-CM | POA: Insufficient documentation

## 2013-06-24 DIAGNOSIS — J029 Acute pharyngitis, unspecified: Secondary | ICD-10-CM | POA: Insufficient documentation

## 2013-06-24 NOTE — ED Notes (Signed)
Sore throat, began "spitting up blood".  Fever.

## 2013-06-25 ENCOUNTER — Emergency Department (HOSPITAL_COMMUNITY): Payer: Self-pay

## 2013-06-25 MED ORDER — MAGIC MOUTHWASH W/LIDOCAINE: 10.0000 mL | Freq: Four times a day (QID) | ORAL | Status: DC | PRN

## 2013-06-25 NOTE — ED Provider Notes (Signed)
Patient seen/examined in the Emergency Department in conjunction with Midlevel Provider Idol Patient reports sore throat Exam : awake/alert, no stridor, no drooling, neck supple, no distress Plan: advised to quit smoking, close f/u with PCP and likely ENT as may need further evaluation including laryngoscopy.  Advised of possibility of cancer.     Joya Gaskins, MD 06/25/13 0130

## 2013-06-25 NOTE — ED Provider Notes (Signed)
CSN: [id number]     Arrival date & time 06/24/13  2230 History   First MD Initiated Contact with Patient 06/24/13 2353     Chief Complaint  Patient presents with  . Sore Throat   (Consider location/radiation/quality/duration/timing/severity/associated sxs/prior Treatment) HPI Comments: Charles Evans is a 57 y.o. Male presenting with a 1 week history of sore throat which started as a mild irritation but has progresses to right sided pain and foreign body sensation although denies swallowing any known objects, no recent consumption of meats with bones, and no recent fish ingestion.  He was at home this evening when he cleared his throat and brought up thick bloody sputum,  Several episodes over the course of 1-2 minutes and none since.  He denies nose bleeds, hematemesis, dizziness, headache, nasal congestion or discharge.  He has also had no coughing, shortness of breath, nausea, chest pain or fever. He has found no alleviators for his symptoms.  He is not on blood thinners, other than baby aspirin.  He is a 1 ppd smoker.   The history is provided by the patient.    Past Medical History  Diagnosis Date  . Hypertension    Past Surgical History  Procedure Laterality Date  . Hernia repair     Family History  Problem Relation Age of Onset  . Heart disease Father    History  Substance Use Topics  . Smoking status: Current Every Day Smoker -- 1.00 packs/day    Types: Cigarettes  . Smokeless tobacco: Not on file  . Alcohol Use: No    Review of Systems  Constitutional: Negative for fever and chills.  HENT: Positive for sore throat. Negative for congestion, mouth sores, nosebleeds, postnasal drip, rhinorrhea, sinus pressure, trouble swallowing and voice change.   Eyes: Negative.   Respiratory: Negative for chest tightness and shortness of breath.   Cardiovascular: Negative for chest pain.  Gastrointestinal: Negative for nausea and abdominal pain.  Genitourinary: Negative.    Musculoskeletal: Negative for arthralgias, joint swelling and neck pain.  Skin: Negative.  Negative for rash and wound.  Neurological: Negative for dizziness, weakness, light-headedness, numbness and headaches.  Hematological: Does not bruise/bleed easily.  Psychiatric/Behavioral: Negative.     Allergies  Penicillins  Home Medications   Current Outpatient Rx  Name  Route  Sig  Dispense  Refill  . Alum & Mag Hydroxide-Simeth (MAGIC MOUTHWASH W/LIDOCAINE) SOLN   Oral   Take 10 mLs by mouth 4 (four) times daily as needed (throat pain).   120 mL   0     Equal parts lidocaine and other ingredients.   Marland Kitchen aspirin 81 MG tablet   Oral   Take 81 mg by mouth daily.           . cyclobenzaprine (FLEXERIL) 10 MG tablet   Oral   Take 1 tablet (10 mg total) by mouth 2 (two) times daily as needed for muscle spasms.   20 tablet   0   . HYDROcodone-acetaminophen (NORCO/VICODIN) 5-325 MG per tablet   Oral   Take 1 tablet by mouth every 4 (four) hours as needed.   15 tablet   0   . lisinopril (PRINIVIL,ZESTRIL) 10 MG tablet   Oral   Take 10 mg by mouth daily.           . predniSONE (DELTASONE) 10 MG tablet   Oral   Take 2 tablets (20 mg total) by mouth daily.   20 tablet   0  BP 134/84  Pulse 74  Temp(Src) 98.8 F (37.1 C) (Oral)  Resp 20  Ht 6\' 4"  (1.93 m)  Wt 198 lb (89.812 kg)  BMI 24.11 kg/m2  SpO2 100% Physical Exam  Nursing note and vitals reviewed. Constitutional: He appears well-developed and well-nourished.  HENT:  Head: Normocephalic and atraumatic.  Right Ear: Tympanic membrane and external ear normal.  Left Ear: Tympanic membrane and external ear normal.  Nose: Mucosal edema present. No rhinorrhea or nasal septal hematoma. No epistaxis.  Mouth/Throat: Uvula is midline, oropharynx is clear and moist and mucous membranes are normal. No trismus in the jaw. No oropharyngeal exudate.  Tonsillar hypertrophy, symmetric,  Uvula midline.  No post nasal drip, no  blood seen.  Eyes: Conjunctivae are normal.  Neck: Normal range of motion. No muscular tenderness present. No rigidity. No edema and normal range of motion present. No mass present.  Cardiovascular: Normal rate, regular rhythm, normal heart sounds and intact distal pulses.   Pulmonary/Chest: Effort normal and breath sounds normal. No stridor. No respiratory distress. He has no wheezes. He has no rhonchi. He has no rales.  Abdominal: Soft. Bowel sounds are normal. There is no tenderness.  Musculoskeletal: Normal range of motion.  Neurological: He is alert.  Skin: Skin is warm and dry.  Psychiatric: He has a normal mood and affect.    ED Course  Procedures (including critical care time) Labs Review Labs Reviewed - No data to display Imaging Review Dg Neck Soft Tissue  06/25/2013   CLINICAL DATA:  Sore throat, cough  EXAM: NECK SOFT TISSUES - 1+ VIEW  COMPARISON:  Prior radiograph from 09/20/2007.  FINDINGS: There is no evidence of retropharyngeal soft tissue swelling or epiglottic enlargement. The cervical airway is unremarkable and no radio-opaque foreign body identified. Mild multilevel degenerative disc disease is noted within the visualized cervical spine, most evident at C5-6, unchanged.  IMPRESSION: No radiographic evidence of acute abnormality within the neck.   Electronically Signed   By: Rise Mu M.D.   On: 06/25/2013 01:21    EKG Interpretation   None       MDM   1. Sore throat    Pt discussed with Dr Bebe Shaggy who also saw patient.   Patients labs and/or radiological studies were viewed and considered during the medical decision making and disposition process.  Pt was encouraged in smoking cessation,  Advised need close f/u with pcp, also referred to ENT Dr Suszanne Conners for further evaluation, given smoking hx, blood of unclear original, need for laryngoscopy discussed.  In the interim,  Prescribed magic mouthwash,  enocouraged warm salt gargles, throat lozenges.   Return here for worsened sx.  The patient appears reasonably screened and/or stabilized for discharge and I doubt any other medical condition or other [facility] requiring further screening, evaluation, or treatment in the ED at this time prior to discharge.     Burgess Amor, PA-C 06/25/13 [phone]

## 2013-06-25 NOTE — ED Provider Notes (Signed)
Medical screening examination/treatment/procedure(s) were conducted as a shared visit with non-physician practitioner(s) and myself.  I personally evaluated the patient during the encounter.  EKG Interpretation   None         Joya Gaskins, MD 06/25/13 [phone]

## 2015-07-09 ENCOUNTER — Encounter (HOSPITAL_COMMUNITY): Payer: Self-pay | Admitting: Emergency Medicine

## 2015-07-09 ENCOUNTER — Emergency Department (HOSPITAL_COMMUNITY)
Admission: EM | Admit: 2015-07-09 | Discharge: 2015-07-09 | Disposition: A | Discharge status: Home / Self Care | Payer: BLUE CROSS/BLUE SHIELD | Attending: Emergency Medicine | Admitting: Emergency Medicine

## 2015-07-09 ENCOUNTER — Emergency Department (HOSPITAL_COMMUNITY): Payer: BLUE CROSS/BLUE SHIELD

## 2015-07-09 DIAGNOSIS — S20211A Contusion of right front wall of thorax, initial encounter: Secondary | ICD-10-CM | POA: Diagnosis not present

## 2015-07-09 DIAGNOSIS — Z880 Allergy status to penicillin: Secondary | ICD-10-CM | POA: Diagnosis not present

## 2015-07-09 DIAGNOSIS — Y9389 Activity, other specified: Secondary | ICD-10-CM | POA: Diagnosis not present

## 2015-07-09 DIAGNOSIS — Z7982 Long term (current) use of aspirin: Secondary | ICD-10-CM | POA: Insufficient documentation

## 2015-07-09 DIAGNOSIS — Y92410 Unspecified street and highway as the place of occurrence of the external cause: Secondary | ICD-10-CM | POA: Insufficient documentation

## 2015-07-09 DIAGNOSIS — F17210 Nicotine dependence, cigarettes, uncomplicated: Secondary | ICD-10-CM | POA: Insufficient documentation

## 2015-07-09 DIAGNOSIS — S134XXA Sprain of ligaments of cervical spine, initial encounter: Secondary | ICD-10-CM | POA: Insufficient documentation

## 2015-07-09 DIAGNOSIS — S338XXA Sprain of other parts of lumbar spine and pelvis, initial encounter: Secondary | ICD-10-CM | POA: Diagnosis not present

## 2015-07-09 DIAGNOSIS — Z79899 Other long term (current) drug therapy: Secondary | ICD-10-CM | POA: Insufficient documentation

## 2015-07-09 DIAGNOSIS — I10 Essential (primary) hypertension: Secondary | ICD-10-CM | POA: Diagnosis not present

## 2015-07-09 DIAGNOSIS — Y998 Other external cause status: Secondary | ICD-10-CM | POA: Diagnosis not present

## 2015-07-09 DIAGNOSIS — S3992XA Unspecified injury of lower back, initial encounter: Secondary | ICD-10-CM | POA: Diagnosis present

## 2015-07-09 DIAGNOSIS — Z7952 Long term (current) use of systemic steroids: Secondary | ICD-10-CM | POA: Insufficient documentation

## 2015-07-09 DIAGNOSIS — S335XXA Sprain of ligaments of lumbar spine, initial encounter: Secondary | ICD-10-CM

## 2015-07-09 DIAGNOSIS — S139XXA Sprain of joints and ligaments of unspecified parts of neck, initial encounter: Secondary | ICD-10-CM

## 2015-07-09 MED ORDER — HYDROCODONE-ACETAMINOPHEN 5-325 MG PO TABS
ORAL_TABLET | ORAL | Status: DC
Start: 2015-07-09 — End: 2015-07-27

## 2015-07-09 MED ORDER — CYCLOBENZAPRINE HCL 10 MG PO TABS: 10.0000 mg | ORAL_TABLET | Freq: Three times a day (TID) | ORAL | Status: DC | PRN

## 2015-07-09 MED ORDER — HYDROCODONE-ACETAMINOPHEN 5-325 MG PO TABS: ORAL_TABLET | ORAL | Status: DC

## 2015-07-09 NOTE — ED Notes (Signed)
Pt verbalized understanding of no driving and to use caution within 4 hours of taking pain meds due to meds cause drowsiness 
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
## 2015-07-11 MED FILL — Hydrocodone-Acetaminophen Tab 5-325 MG: ORAL | Qty: 6 | Status: AC

## 2015-07-27 ENCOUNTER — Emergency Department (HOSPITAL_COMMUNITY)
Admission: EM | Admit: 2015-07-27 | Discharge: 2015-07-27 | Disposition: A | Discharge status: Home / Self Care | Payer: BLUE CROSS/BLUE SHIELD | Attending: Emergency Medicine | Admitting: Emergency Medicine

## 2015-07-27 ENCOUNTER — Encounter (HOSPITAL_COMMUNITY): Payer: Self-pay

## 2015-07-27 DIAGNOSIS — M5430 Sciatica, unspecified side: Secondary | ICD-10-CM | POA: Insufficient documentation

## 2015-07-27 DIAGNOSIS — M545 Low back pain: Secondary | ICD-10-CM | POA: Diagnosis present

## 2015-07-27 DIAGNOSIS — Z79899 Other long term (current) drug therapy: Secondary | ICD-10-CM | POA: Insufficient documentation

## 2015-07-27 DIAGNOSIS — F17210 Nicotine dependence, cigarettes, uncomplicated: Secondary | ICD-10-CM | POA: Insufficient documentation

## 2015-07-27 DIAGNOSIS — Z7982 Long term (current) use of aspirin: Secondary | ICD-10-CM | POA: Insufficient documentation

## 2015-07-27 DIAGNOSIS — I10 Essential (primary) hypertension: Secondary | ICD-10-CM | POA: Insufficient documentation

## 2015-07-27 DIAGNOSIS — Z880 Allergy status to penicillin: Secondary | ICD-10-CM | POA: Insufficient documentation

## 2015-07-27 DIAGNOSIS — Z7952 Long term (current) use of systemic steroids: Secondary | ICD-10-CM | POA: Diagnosis not present

## 2015-07-27 MED ORDER — CYCLOBENZAPRINE HCL 10 MG PO TABS: 10.0000 mg | ORAL_TABLET | Freq: Three times a day (TID) | ORAL | Status: DC

## 2015-07-27 MED ORDER — IBUPROFEN 600 MG PO TABS: 600.0000 mg | ORAL_TABLET | Freq: Four times a day (QID) | ORAL | Status: DC

## 2015-07-27 MED ORDER — KETOROLAC TROMETHAMINE 60 MG/2ML IM SOLN
60.0000 mg | Freq: Once | INTRAMUSCULAR | Status: AC
  Administered 2015-07-27: 60 mg via INTRAMUSCULAR
  Filled 2015-07-27: qty 2

## 2015-07-27 MED ORDER — DEXAMETHASONE SODIUM PHOSPHATE 4 MG/ML IJ SOLN
8.0000 mg | Freq: Once | INTRAMUSCULAR | Status: AC
  Administered 2015-07-27: 8 mg via INTRAMUSCULAR
  Filled 2015-07-27: qty 2

## 2015-07-27 MED ORDER — HYDROCODONE-ACETAMINOPHEN 5-325 MG PO TABS: 1.0000 | ORAL_TABLET | ORAL | Status: DC | PRN

## 2015-07-27 MED ORDER — DEXAMETHASONE 4 MG PO TABS: 4.0000 mg | ORAL_TABLET | Freq: Two times a day (BID) | ORAL | Status: DC
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
## 2015-07-27 NOTE — Discharge Instructions (Signed)
Please see Dr Lorin Mercy, or a member of his team as soon as possible. Flexeril and norco may cause drowsiness, use with caution. Sciatica Sciatica is pain, weakness, numbness, or tingling along your sciatic nerve. The nerve starts in the lower back and runs down the back of each leg. Nerve damage or certain conditions pinch or put pressure on the sciatic nerve. This causes the pain, weakness, and other discomforts of sciatica. HOME CARE   Only take medicine as told by your doctor.  Apply ice to the affected area for 20 minutes. Do this 3-4 times a day for the first 48-72 hours. Then try heat in the same way.  Exercise, stretch, or do your usual activities if these do not make your pain worse.  Go to physical therapy as told by your doctor.  Keep all doctor visits as told.  Do not wear high heels or shoes that are not supportive.  Get a firm mattress if your mattress is too soft to lessen pain and discomfort. GET HELP RIGHT AWAY IF:   You cannot control when you poop (bowel movement) or pee (urinate).  You have more weakness in your lower back, lower belly (pelvis), butt (buttocks), or legs.  You have redness or puffiness (swelling) of your back.  You have a burning feeling when you pee.  You have pain that gets worse when you lie down.  You have pain that wakes you from your sleep.  Your pain is worse than past pain.  Your pain lasts longer than 4 weeks.  You are suddenly losing weight without reason. MAKE SURE YOU:   Understand these instructions.  Will watch this condition.  Will get help right away if you are not doing well or get worse.   This information is not intended to replace advice given to you by your health care provider. Make sure you discuss any questions you have with your health care provider.   Document Released: 04/16/2008 Document Revised: 03/29/2015 Document Reviewed: 11/17/2011 Elsevier Interactive Patient Education [employer].
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
## 2015-08-02 ENCOUNTER — Encounter (HOSPITAL_COMMUNITY): Payer: Self-pay

## 2015-08-02 ENCOUNTER — Emergency Department (HOSPITAL_COMMUNITY)
Admission: EM | Admit: 2015-08-02 | Discharge: 2015-08-02 | Disposition: A | Discharge status: Home / Self Care | Payer: BLUE CROSS/BLUE SHIELD | Attending: Emergency Medicine | Admitting: Emergency Medicine

## 2015-08-02 DIAGNOSIS — I10 Essential (primary) hypertension: Secondary | ICD-10-CM | POA: Diagnosis not present

## 2015-08-02 DIAGNOSIS — Z79899 Other long term (current) drug therapy: Secondary | ICD-10-CM | POA: Insufficient documentation

## 2015-08-02 DIAGNOSIS — Z880 Allergy status to penicillin: Secondary | ICD-10-CM | POA: Diagnosis not present

## 2015-08-02 DIAGNOSIS — Z7982 Long term (current) use of aspirin: Secondary | ICD-10-CM | POA: Diagnosis not present

## 2015-08-02 DIAGNOSIS — R1013 Epigastric pain: Secondary | ICD-10-CM | POA: Insufficient documentation

## 2015-08-02 DIAGNOSIS — R110 Nausea: Secondary | ICD-10-CM | POA: Insufficient documentation

## 2015-08-02 DIAGNOSIS — R066 Hiccough: Secondary | ICD-10-CM | POA: Diagnosis present

## 2015-08-02 DIAGNOSIS — F17210 Nicotine dependence, cigarettes, uncomplicated: Secondary | ICD-10-CM | POA: Insufficient documentation

## 2015-08-02 LAB — CBC WITH DIFFERENTIAL/PLATELET
BASOS ABS: 0 10*3/uL (ref 0.0–0.1)
BASOS PCT: 0 %
Eosinophils Absolute: 0.3 10*3/uL (ref 0.0–0.7)
Eosinophils Relative: 3 %
HCT: 45.8 % (ref 39.0–52.0)
Hemoglobin: 15.8 g/dL (ref 13.0–17.0)
LYMPHS PCT: 42 %
Lymphs Abs: 4.5 10*3/uL — ABNORMAL HIGH (ref 0.7–4.0)
MCH: 30.2 pg (ref 26.0–34.0)
MCHC: 34.5 g/dL (ref 30.0–36.0)
MCV: 87.6 fL (ref 78.0–100.0)
MONO ABS: 0.8 10*3/uL (ref 0.1–1.0)
MONOS PCT: 7 %
NEUTROS ABS: 5.3 10*3/uL (ref 1.7–7.7)
NEUTROS PCT: 48 %
Platelets: 170 10*3/uL (ref 150–400)
RBC: 5.23 MIL/uL (ref 4.22–5.81)
RDW: 15 % (ref 11.5–15.5)
WBC: 10.9 10*3/uL — ABNORMAL HIGH (ref 4.0–10.5)

## 2015-08-02 LAB — COMPREHENSIVE METABOLIC PANEL
ALBUMIN: 3.3 g/dL — AB (ref 3.5–5.0)
ALT: 24 U/L (ref 17–63)
ANION GAP: 8 (ref 5–15)
AST: 18 U/L (ref 15–41)
Alkaline Phosphatase: 58 U/L (ref 38–126)
BUN: 17 mg/dL (ref 6–20)
CHLORIDE: 103 mmol/L (ref 101–111)
CO2: 30 mmol/L (ref 22–32)
Calcium: 9 mg/dL (ref 8.9–10.3)
Creatinine, Ser: 1.06 mg/dL (ref 0.61–1.24)
GFR calc non Af Amer: >60 mL/min (ref >60)
GLUCOSE: 92 mg/dL (ref 65–99)
POTASSIUM: 3.5 mmol/L (ref 3.5–5.1)
SODIUM: 141 mmol/L (ref 135–145)
Total Bilirubin: 0.7 mg/dL (ref 0.3–1.2)
Total Protein: 6.4 g/dL — ABNORMAL LOW (ref 6.5–8.1)

## 2015-08-02 LAB — LIPASE, BLOOD: LIPASE: 35 U/L (ref 11–51)

## 2015-08-02 MED ORDER — GI COCKTAIL ~~LOC~~
30.0000 mL | Freq: Once | ORAL | Status: AC
  Administered 2015-08-02: 30 mL via ORAL
  Filled 2015-08-02: qty 30

## 2015-08-02 MED ORDER — PROMETHAZINE HCL 25 MG/ML IJ SOLN
25.0000 mg | Freq: Once | INTRAMUSCULAR | Status: AC
  Administered 2015-08-02: 25 mg via INTRAMUSCULAR
  Filled 2015-08-02: qty 1

## 2015-08-02 MED ORDER — CHLORPROMAZINE HCL 25 MG PO TABS: 25.0000 mg | ORAL_TABLET | Freq: Three times a day (TID) | ORAL | Status: DC

## 2015-08-02 MED ORDER — LORAZEPAM 2 MG/ML IJ SOLN
1.0000 mg | Freq: Once | INTRAMUSCULAR | Status: AC
  Administered 2015-08-02: 1 mg via INTRAVENOUS
  Filled 2015-08-02: qty 1

## 2015-08-02 NOTE — Discharge Instructions (Signed)
You have been prescribed a new medication for your.  Please be sure to take this 3 times daily.  After one day without symptoms, you may stop taking this medication.  Return here for concerning changes in your condition.    Hiccups A hiccup is the result of a sudden shortening of the muscle below your lungs (diaphragm). This movement of your diaphragm causes a sudden inhalation followed by the closing of your vocal cords, which causes the hiccup sound. Most people get the hiccups. Typically, hiccups last only a short amount of time.  There are three types of hiccups:   Benign. These hiccups last less than 48 hours.   Persistent. These hiccups last more than 48 hours, but less than 1 month.   Intractable. These hiccups last more than 1 month.  A hiccup is a reflex. You cannot control reflexes.  HOME CARE INSTRUCTIONS  Watch your hiccups for any changes. The following actions may help to lessen any discomfort that you are feeling:  Eat small meals.   Limit alcohol intake to no more than 1 drink per day for nonpregnant women and 2 drinks per day for men. One drink equals 12 oz of beer, 5 oz of wine, or 1 oz of hard liquor.  Limit drinking carbonated or fizzy drinks, such as soda.  Eat and chew your food slowly.   Avoid eating or drinking hot or spicy foods and drinks.  Take medicines only as directed by your health care provider.  SEEK MEDICAL CARE IF:   Your hiccups last for more than 48 hours.   Your hiccups do not improve with treatment.  You cannot sleep or eat due to the hiccups.   You have unexpected weight loss due to the hiccups.   You have a fever.   You have trouble breathing or swallowing.   You develop severe pain in your abdomen.  You develop numbness, tingling, or weakness.   This information is not intended to replace advice given to you by your health care provider. Make sure you discuss any questions you have with your health care  provider.   Document Released: 09/16/2001 Document Revised: 11/22/2014 Document Reviewed: 07/04/2014 Elsevier Interactive Patient Education [employer].
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
## 2015-08-04 ENCOUNTER — Emergency Department (HOSPITAL_COMMUNITY)
Admission: EM | Admit: 2015-08-04 | Discharge: 2015-08-04 | Disposition: A | Discharge status: Home / Self Care | Payer: BLUE CROSS/BLUE SHIELD | Attending: Emergency Medicine | Admitting: Emergency Medicine

## 2015-08-04 ENCOUNTER — Encounter (HOSPITAL_COMMUNITY): Payer: Self-pay | Admitting: *Deleted

## 2015-08-04 DIAGNOSIS — F17210 Nicotine dependence, cigarettes, uncomplicated: Secondary | ICD-10-CM | POA: Diagnosis not present

## 2015-08-04 DIAGNOSIS — Z880 Allergy status to penicillin: Secondary | ICD-10-CM | POA: Diagnosis not present

## 2015-08-04 DIAGNOSIS — Z7982 Long term (current) use of aspirin: Secondary | ICD-10-CM | POA: Insufficient documentation

## 2015-08-04 DIAGNOSIS — Z9889 Other specified postprocedural states: Secondary | ICD-10-CM | POA: Diagnosis not present

## 2015-08-04 DIAGNOSIS — Z79899 Other long term (current) drug therapy: Secondary | ICD-10-CM | POA: Diagnosis not present

## 2015-08-04 DIAGNOSIS — R066 Hiccough: Secondary | ICD-10-CM | POA: Diagnosis present

## 2015-08-04 DIAGNOSIS — I10 Essential (primary) hypertension: Secondary | ICD-10-CM | POA: Insufficient documentation

## 2015-08-04 MED ORDER — CHLORPROMAZINE HCL 50 MG/2ML IJ SOLN
INTRAMUSCULAR | Status: AC
  Filled 2015-08-04: qty 2

## 2015-08-04 MED ORDER — CHLORPROMAZINE HCL 25 MG/ML IJ SOLN
25.0000 mg | Freq: Once | INTRAMUSCULAR | Status: DC
  Filled 2015-08-04: qty 1

## 2015-08-04 MED ORDER — CHLORPROMAZINE HCL 25 MG/ML IJ SOLN
25.0000 mg | Freq: Once | INTRAMUSCULAR | Status: AC
  Administered 2015-08-04: 25 mg via INTRAVENOUS
  Filled 2015-08-04: qty 1

## 2015-08-04 MED ORDER — SODIUM CHLORIDE 0.9 % IV BOLUS (SEPSIS): 1000.0000 mL | Freq: Once | INTRAVENOUS | Status: DC

## 2015-08-04 NOTE — ED Notes (Signed)
MD at bedside. 

## 2015-08-04 NOTE — ED Provider Notes (Signed)
CSN: [id number]     Arrival date & time 08/04/15  1616 History   First MD Initiated Contact with Patient 08/04/15 1633     Chief Complaint  Patient presents with  . Hiccups     (Consider location/radiation/quality/duration/timing/severity/associated sxs/prior Treatment) HPI Patient presents for second time in 3 days. Patient is concern for persistent hiccups. Symptoms have been present for about 1 week, and there was transient improvement while he was here 2 days ago. Since discharge no other new complaints, including no fever, chills, dyspnea, cough, syncope. Patient was prescribed medication, but has been unable to obtain secondary to cost.  Past Medical History  Diagnosis Date  . Hypertension    Past Surgical History  Procedure Laterality Date  . Hernia repair     Family History  Problem Relation Age of Onset  . Heart disease Father    Social History  Substance Use Topics  . Smoking status: Current Every Day Smoker -- 1.00 packs/day for 20 years    Types: Cigarettes  . Smokeless tobacco: Never Used  . Alcohol Use: No    Review of Systems  Constitutional:       Per HPI, otherwise negative  HENT:       Per HPI, otherwise negative  Respiratory:       Per HPI, otherwise negative  Cardiovascular:       Per HPI, otherwise negative  Gastrointestinal: Negative for vomiting.  Endocrine:       Negative aside from HPI  Genitourinary:       Neg aside from HPI   Musculoskeletal:       Per HPI, otherwise negative  Skin: Negative.   Neurological: Negative for syncope.      Allergies  Penicillins  Home Medications   Prior to Admission medications   Medication Sig Start Date End Date Taking? Authorizing Provider  aspirin EC 81 MG tablet Take 81 mg by mouth daily.    Historical Provider, MD  chlorproMAZINE (THORAZINE) 25 MG tablet Take 1 tablet (25 mg total) by mouth 3 (three) times daily. 08/02/15   Carmin Muskrat, MD  HYDROcodone-acetaminophen (NORCO/VICODIN)  5-325 MG tablet Take 1 tablet by mouth every 4 (four) hours as needed. Patient taking differently: Take 1 tablet by mouth every 4 (four) hours as needed for moderate pain.  07/27/15   Lily Kocher, PA-C  lisinopril (PRINIVIL,ZESTRIL) 10 MG tablet Take 10 mg by mouth daily.      Historical Provider, MD   BP 150/98 mmHg  Pulse 81  Temp(Src) 98.1 F (36.7 C) (Oral)  Resp 18  Ht 6\' 2"  (1.88 m)  Wt 202 lb (91.627 kg)  BMI 25.92 kg/m2  SpO2 99% Physical Exam  Constitutional: He is oriented to person, place, and time. He appears well-developed. No distress.  HENT:  Head: Normocephalic and atraumatic.  Eyes: Conjunctivae and EOM are normal.  Cardiovascular: Normal rate and regular rhythm.   Pulmonary/Chest: Effort normal. No stridor. No respiratory distress.  Abdominal: He exhibits no distension.  Active hiccups  Musculoskeletal: He exhibits no edema.  Neurological: He is alert and oriented to person, place, and time.  Skin: Skin is warm and dry.  Psychiatric: He has a normal mood and affect.  Nursing note and vitals reviewed.   ED Course  Procedures (including critical care time) 99%ra, nml   8:39 PM Patient has received almost the entirety of his Thorazine dose. I Have resolved. Patient states that this is the best he has felt in 9 days.  We discussed the need to obtain his prescription, if his hiccups return. MDM  Patient resents with intractable hiccups. Notably, the patient was here several days ago, had brief resolution, but was unable to obtain his medication on discharge. Patient received IV therapy, for this episode, had complete resolution of symptoms, no new complaints, felt substantially better, was discharged in stable condition.  Carmin Muskrat, MD 08/04/15 2040

## 2015-08-04 NOTE — ED Notes (Signed)
Pt was here on the 11th for the same. Pt states he continues to have hiccups; 9 days total. Pt states hiccups started after taking steroids for sciatica. Has stopped the steroids and hiccups continue.

## 2015-08-04 NOTE — Discharge Instructions (Signed)
As discussed, if you're hiccups return, you need to obtain your prescribed medication from your last visit.  Otherwise, please be sure to follow-up with your primary care physician, and do not hesitate to return here if you develop new, or concerning changes in your condition.   Hiccups A hiccup is the result of a sudden shortening of the muscle below your lungs (diaphragm). This movement of your diaphragm causes a sudden inhalation followed by the closing of your vocal cords, which causes the hiccup sound. Most people get the hiccups. Typically, hiccups last only a short amount of time.  There are three types of hiccups:   Benign. These hiccups last less than 48 hours.   Persistent. These hiccups last more than 48 hours, but less than 1 month.   Intractable. These hiccups last more than 1 month.  A hiccup is a reflex. You cannot control reflexes.  HOME CARE INSTRUCTIONS  Watch your hiccups for any changes. The following actions may help to lessen any discomfort that you are feeling:  Eat small meals.   Limit alcohol intake to no more than 1 drink per day for nonpregnant women and 2 drinks per day for men. One drink equals 12 oz of beer, 5 oz of wine, or 1 oz of hard liquor.  Limit drinking carbonated or fizzy drinks, such as soda.  Eat and chew your food slowly.   Avoid eating or drinking hot or spicy foods and drinks.  Take medicines only as directed by your health care provider.  SEEK MEDICAL CARE IF:   Your hiccups last for more than 48 hours.   Your hiccups do not improve with treatment.  You cannot sleep or eat due to the hiccups.   You have unexpected weight loss due to the hiccups.   You have a fever.   You have trouble breathing or swallowing.   You develop severe pain in your abdomen.  You develop numbness, tingling, or weakness.   This information is not intended to replace advice given to you by your health care provider. Make sure you discuss  any questions you have with your health care provider.   Document Released: 09/16/2001 Document Revised: 11/22/2014 Document Reviewed: 07/04/2014 Elsevier Interactive Patient Education [employer].

## 2015-08-04 NOTE — ED Notes (Signed)
Pt reports continued hiccups x 9 days. Here 2 days ago for same symptoms. Pt was given prescription for medication upon discharge on 08/02/15 but was unable to get it filled due the cost, $87. Pt's family member at bedside reports pt was having "difficulty breathing" this morning because the hiccups were so intense.

## 2015-08-07 ENCOUNTER — Telehealth: Payer: Self-pay | Admitting: Gastroenterology

## 2015-08-07 NOTE — Telephone Encounter (Signed)
Pt's wife called to make OV with [place]. Pt was seen in the ED on Friday for hiccups and was advised to follow up with [place]. [phone]

## 2015-08-08 NOTE — Telephone Encounter (Signed)
Reviewed ER records. Nonurgent OV ok.

## 2015-08-08 NOTE — Telephone Encounter (Signed)
Wife is aware of OV

## 2015-08-08 NOTE — Telephone Encounter (Signed)
Pt's wife called this morning to follow up on ED referral. I told her provider was aware and seeing patients this morning and I would let her know something as soon as I hear back from provider.

## 2015-08-15 ENCOUNTER — Ambulatory Visit (INDEPENDENT_AMBULATORY_CARE_PROVIDER_SITE_OTHER): Payer: BLUE CROSS/BLUE SHIELD | Admitting: Gastroenterology

## 2015-08-15 ENCOUNTER — Other Ambulatory Visit: Payer: Self-pay

## 2015-08-15 ENCOUNTER — Encounter: Payer: Self-pay | Admitting: Gastroenterology

## 2015-08-15 VITALS — BP 162/101 | HR 83 | Temp 98.3°F | Ht 76.0 in | Wt 199.2 lb

## 2015-08-15 DIAGNOSIS — R1013 Epigastric pain: Secondary | ICD-10-CM | POA: Diagnosis not present

## 2015-08-15 DIAGNOSIS — Z1211 Encounter for screening for malignant neoplasm of colon: Secondary | ICD-10-CM | POA: Insufficient documentation

## 2015-08-15 DIAGNOSIS — R059 Cough, unspecified: Secondary | ICD-10-CM | POA: Insufficient documentation

## 2015-08-15 DIAGNOSIS — R066 Hiccough: Secondary | ICD-10-CM

## 2015-08-15 DIAGNOSIS — R05 Cough: Secondary | ICD-10-CM | POA: Diagnosis not present

## 2015-08-15 MED ORDER — PEG 3350-KCL-NA BICARB-NACL 420 G PO SOLR: 4000.0000 mL | Freq: Once | ORAL | Status: DC

## 2015-08-15 NOTE — Assessment & Plan Note (Signed)
60 year old gentleman but no prior colonoscopy presents for colon cancer screening. Cousin had colon cancer prior to age of 30.  I have discussed the risks, alternatives, benefits with regards to but not limited to the risk of reaction to medication, bleeding, infection, perforation and the patient is agreeable to proceed. Written consent to be obtained.

## 2015-08-15 NOTE — Patient Instructions (Signed)
1. Please have your chest x-ray done. 2. Colonoscopy and upper endoscopy with Dr. Oneida Alar. See separate instructions. 3. Out a work note provided for you to cover through your procedures. Release to work on 08/22/2015.
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
## 2015-08-15 NOTE — Progress Notes (Signed)
cc'ed to pcp °

## 2015-08-21 ENCOUNTER — Ambulatory Visit (HOSPITAL_COMMUNITY)
Admission: RE | Admit: 2015-08-21 | Discharge: 2015-08-21 | Disposition: A | Discharge status: Home / Self Care | Payer: BLUE CROSS/BLUE SHIELD | Source: Ambulatory Visit | Attending: Gastroenterology | Admitting: Gastroenterology

## 2015-08-21 ENCOUNTER — Encounter (HOSPITAL_COMMUNITY)
Admission: RE | Disposition: A | Discharge status: Home / Self Care | Payer: Self-pay | Source: Ambulatory Visit | Attending: Gastroenterology

## 2015-08-21 ENCOUNTER — Encounter (HOSPITAL_COMMUNITY): Payer: Self-pay | Admitting: *Deleted

## 2015-08-21 DIAGNOSIS — K2970 Gastritis, unspecified, without bleeding: Secondary | ICD-10-CM | POA: Diagnosis not present

## 2015-08-21 DIAGNOSIS — I10 Essential (primary) hypertension: Secondary | ICD-10-CM | POA: Insufficient documentation

## 2015-08-21 DIAGNOSIS — R066 Hiccough: Secondary | ICD-10-CM

## 2015-08-21 DIAGNOSIS — K648 Other hemorrhoids: Secondary | ICD-10-CM | POA: Diagnosis not present

## 2015-08-21 DIAGNOSIS — K2950 Unspecified chronic gastritis without bleeding: Secondary | ICD-10-CM | POA: Insufficient documentation

## 2015-08-21 DIAGNOSIS — R1013 Epigastric pain: Secondary | ICD-10-CM | POA: Insufficient documentation

## 2015-08-21 DIAGNOSIS — R059 Cough, unspecified: Secondary | ICD-10-CM

## 2015-08-21 DIAGNOSIS — Z79899 Other long term (current) drug therapy: Secondary | ICD-10-CM | POA: Insufficient documentation

## 2015-08-21 DIAGNOSIS — F17210 Nicotine dependence, cigarettes, uncomplicated: Secondary | ICD-10-CM | POA: Insufficient documentation

## 2015-08-21 DIAGNOSIS — Z1211 Encounter for screening for malignant neoplasm of colon: Secondary | ICD-10-CM | POA: Diagnosis present

## 2015-08-21 DIAGNOSIS — Z7982 Long term (current) use of aspirin: Secondary | ICD-10-CM | POA: Insufficient documentation

## 2015-08-21 DIAGNOSIS — R05 Cough: Secondary | ICD-10-CM

## 2015-08-21 HISTORY — PX: COLONOSCOPY: SHX5424

## 2015-08-21 HISTORY — PX: ESOPHAGOGASTRODUODENOSCOPY: SHX5428

## 2015-08-21 SURGERY — COLONOSCOPY
Anesthesia: Moderate Sedation

## 2015-08-21 MED ORDER — SODIUM CHLORIDE 0.9 % IV SOLN
INTRAVENOUS | Status: DC
  Administered 2015-08-21: 1000 mL via INTRAVENOUS

## 2015-08-21 MED ORDER — STERILE WATER FOR IRRIGATION IR SOLN
Status: DC | PRN
  Administered 2015-08-21: 15:00:00

## 2015-08-21 MED ORDER — MIDAZOLAM HCL 5 MG/5ML IJ SOLN
INTRAMUSCULAR | Status: AC
  Filled 2015-08-21: qty 10

## 2015-08-21 MED ORDER — LIDOCAINE VISCOUS 2 % MT SOLN
OROMUCOSAL | Status: DC | PRN
  Administered 2015-08-21: 1 via OROMUCOSAL

## 2015-08-21 MED ORDER — MIDAZOLAM HCL 5 MG/5ML IJ SOLN
INTRAMUSCULAR | Status: DC | PRN
  Administered 2015-08-21 (×2): 1 mg via INTRAVENOUS
  Administered 2015-08-21 (×2): 2 mg via INTRAVENOUS

## 2015-08-21 MED ORDER — LIDOCAINE VISCOUS 2 % MT SOLN
OROMUCOSAL | Status: AC
  Filled 2015-08-21: qty 15

## 2015-08-21 MED ORDER — MEPERIDINE HCL 100 MG/ML IJ SOLN
INTRAMUSCULAR | Status: AC
  Filled 2015-08-21: qty 2

## 2015-08-21 MED ORDER — MEPERIDINE HCL 100 MG/ML IJ SOLN
INTRAMUSCULAR | Status: DC | PRN
  Administered 2015-08-21: 25 mg via INTRAVENOUS
  Administered 2015-08-21: 50 mg via INTRAVENOUS
  Administered 2015-08-21: 25 mg via INTRAVENOUS

## 2015-08-21 NOTE — Progress Notes (Signed)
Patient discharged at 1615. VSS. Patient alert and talking. No complications noted.

## 2015-08-21 NOTE — Discharge Instructions (Signed)
Your have internal hemorrhoids. YOU DID NOT HAVE POLYPS. You have gastritis MOST LIKELY DUE TO steroids.  I biopsied your stomach.   Ok to return to work without RESTRICTION Aug 23, 2015.  FOLLOW A HIGH FIBER/LOW FAT DIET. AVOID ITEMS THAT CAUSE BLOATING. SEE INFO BELOW.  AVOID ITEMS THAT TRIGGER GASTRITIS. SEE INFO BELOW.  YOUR BIOPSY RESULTS WILL BE AVAILABLE IN MY CHART FEB 2 AND MY OFFICE WILL CONTACT YOU IN 10-14 DAYS WITH YOUR RESULTS.   Next colonoscopy in 10 years.   ENDOSCOPY Care After Read the instructions outlined below and refer to this sheet in the next week. These discharge instructions provide you with general information on caring for yourself after you leave the hospital. While your treatment has been planned according to the most current medical practices available, unavoidable complications occasionally occur. If you have any problems or questions after discharge, call DR. Meghna Hagmann, [phone].  ACTIVITY  You may resume your regular activity, but move at a slower pace for the next 24 hours.   Take frequent rest periods for the next 24 hours.   Walking will help get rid of the air and reduce the bloated feeling in your belly (abdomen).   No driving for 24 hours (because of the medicine (anesthesia) used during the test).   You may shower.   Do not sign any important legal documents or operate any machinery for 24 hours (because of the anesthesia used during the test).    NUTRITION  Drink plenty of fluids.   You may resume your normal diet as instructed by your doctor.   Begin with a light meal and progress to your normal diet. Heavy or fried foods are harder to digest and may make you feel sick to your stomach (nauseated).   Avoid alcoholic beverages for 24 hours or as instructed.    MEDICATIONS  You may resume your normal medications.   WHAT YOU CAN EXPECT TODAY  Some feelings of bloating in the abdomen.   Passage of more gas than usual.    Spotting of blood in your stool or on the toilet paper  .  IF YOU HAD POLYPS REMOVED DURING THE ENDOSCOPY:  Eat a soft diet IF YOU HAVE NAUSEA, BLOATING, ABDOMINAL PAIN, OR VOMITING.    FINDING OUT THE RESULTS OF YOUR TEST Not all test results are available during your visit. DR. Oneida Alar WILL CALL YOU WITHIN 14 DAYS OF YOUR PROCEDUE WITH YOUR RESULTS. Do not assume everything is normal if you have not heard from DR. Gable Odonohue, CALL HER OFFICE AT [phone].  SEEK IMMEDIATE MEDICAL ATTENTION AND CALL THE OFFICE: [phone] IF:  You have more than a spotting of blood in your stool.   Your belly is swollen (abdominal distention).   You are nauseated or vomiting.   You have a temperature over 101F.   You have abdominal pain or discomfort that is severe or gets worse throughout the day.   Gastritis  Gastritis is an inflammation (the body's way of reacting to injury and/or infection) of the stomach. It is often caused by viral or bacterial (germ) infections. It can also be caused BY ALCOHOL, ASPIRIN, BC/GOODY POWDER'S, (IBUPROFEN) MOTRIN, OR ALEVE (NAPROXEN), chemicals (including alcohol), SPICY FOODS, and medications. This illness may be associated with generalized malaise (feeling tired, not well), UPPER ABDOMINAL STOMACH cramps, and fever. One common bacterial cause of gastritis is an organism known as H. Pylori. This can be treated with antibiotics.    High-Fiber Diet A high-fiber diet  changes your normal diet to include more whole grains, legumes, fruits, and vegetables. Changes in the diet involve replacing refined carbohydrates with unrefined foods. The calorie level of the diet is essentially unchanged. The Dietary Reference Intake (recommended amount) for adult males is 38 grams per day. For adult females, it is 25 grams per day. Pregnant and lactating women should consume 28 grams of fiber per day. Fiber is the intact part of a plant that is not broken down during digestion.  Functional fiber is fiber that has been isolated from the plant to provide a beneficial effect in the body. PURPOSE  Increase stool bulk.   Ease and regulate bowel movements.   Lower cholesterol.  REDUCE RISK OF COLON CANCER  INDICATIONS THAT YOU NEED MORE FIBER  Constipation and hemorrhoids.   Uncomplicated diverticulosis (intestine condition) and irritable bowel syndrome.   Weight management.   As a protective measure against hardening of the arteries (atherosclerosis), diabetes, and cancer.   GUIDELINES FOR INCREASING FIBER IN THE DIET  Start adding fiber to the diet slowly. A gradual increase of about 5 more grams (2 slices of whole-wheat bread, 2 servings of most fruits or vegetables, or 1 bowl of high-fiber cereal) per day is best. Too rapid an increase in fiber may result in constipation, flatulence, and bloating.   Drink enough water and fluids to keep your urine clear or pale yellow. Water, juice, or caffeine-free drinks are recommended. Not drinking enough fluid may cause constipation.   Eat a variety of high-fiber foods rather than one type of fiber.   Try to increase your intake of fiber through using high-fiber foods rather than fiber pills or supplements that contain small amounts of fiber.   The goal is to change the types of food eaten. Do not supplement your present diet with high-fiber foods, but replace foods in your present diet.   INCLUDE A VARIETY OF FIBER SOURCES  Replace refined and processed grains with whole grains, canned fruits with fresh fruits, and incorporate other fiber sources. White rice, white breads, and most bakery goods contain little or no fiber.   [place] whole-grain rice, buckwheat oats, and many fruits and vegetables are all good sources of fiber. These include: broccoli, Brussels sprouts, cabbage, cauliflower, beets, sweet potatoes, white potatoes (skin on), carrots, tomatoes, eggplant, squash, berries, fresh fruits, and dried fruits.    Cereals appear to be the richest source of fiber. Cereal fiber is found in whole grains and bran. Bran is the fiber-rich outer coat of cereal grain, which is largely removed in refining. In whole-grain cereals, the bran remains. In breakfast cereals, the largest amount of fiber is found in those with "bran" in their names. The fiber content is sometimes indicated on the label.   You may need to include additional fruits and vegetables each day.   In baking, for 1 cup white flour, you may use the following substitutions:   1 cup whole-wheat flour minus 2 tablespoons.   1/2 cup white flour plus 1/2 cup whole-wheat flour.   Hemorrhoids Hemorrhoids are dilated (enlarged) veins around the rectum. Sometimes clots will form in the veins. This makes them swollen and painful. These are called thrombosed hemorrhoids. Causes of hemorrhoids include:  Constipation.   Straining to have a bowel movement.   HEAVY LIFTING  HOME CARE INSTRUCTIONS  Eat a well balanced diet and drink 6 to 8 glasses of water every day to avoid constipation. You may also use a bulk laxative.   Avoid straining  to have bowel movements.   Keep anal area dry and clean.   Do not use a donut shaped pillow or sit on the toilet for long periods. This increases blood pooling and pain.   Move your bowels when your body has the urge; this will require less straining and will decrease pain and pressure.

## 2015-08-21 NOTE — Op Note (Signed)
[facility] [address] [place], [address]   ENDOSCOPY PROCEDURE REPORT  PATIENT: Charles Evans, Charles Evans  MR#: [id number] BIRTHDATE: 19-May-1956 , 74  yrs. old GENDER: male  ENDOSCOPIST: Danie Binder, MD REFERRED Twanna Hy, M.D.  PROCEDURE DATE: Aug 24, 2015 PROCEDURE:   EGD w/ biopsy  INDICATIONS:dyspepsia. MEDICATIONS: TCS+ Versed 1 mg IV and Demerol 25 mg IV  MD INITIATED SEDATION: 1455 PROCEDURE COMPLETE: 1541 TOPICAL ANESTHETIC:   Viscous Xylocaine ASA CLASS:  DESCRIPTION OF PROCEDURE:     Physical exam was performed.  Informed consent was obtained from the patient after explaining the benefits, risks, and alternatives to the procedure.  The patient was connected to the monitor and placed in the left lateral position.  Continuous oxygen was provided by nasal cannula and IV medicine administered through an indwelling cannula.  After administration of sedation, the patients esophagus was intubated and the [id number])  endoscope was advanced under direct visualization to the second portion of the duodenum.  The scope was removed slowly by carefully examining the color, texture, anatomy, and integrity of the mucosa on the way out.  The patient was recovered in endoscopy and discharged home in satisfactory condition.  Estimated blood loss is zero unless otherwise noted in this procedure report.    ESOPHAGUS: The mucosa of the esophagus appeared normal.   STOMACH: Moderate non-erosive gastritis (inflammation) was found in the entire examined stomach.  Multiple biopsies were performed using cold forceps.   DUODENUM: The duodenal mucosa showed no abnormalities in the bulb and 2nd part of the duodenum. COMPLICATIONS: There were no immediate complications.  ENDOSCOPIC IMPRESSION: 1.   DYSPEPSIA DUE TO STEROIDS 2.   MODERATE Non-erosive gastritis  RECOMMENDATIONS: FOLLOW A HIGH FIBER DIET. AVOID ITEMS THAT TRIGGER GASTRITIS. AWAIT BIOPSY  RESULTS. Next colonoscopy in 10 years.  REPEAT EXAM:  eSigned:  Danie Binder, MD 24-Aug-2015 3:58 PM   CPT CODES: ICD CODES:  The ICD and CPT codes recommended by this software are interpretations from the data that the clinical staff has captured with the software.  The verification of the translation of this report to the ICD and CPT codes and modifiers is the sole responsibility of the health care institution and practicing physician where this report was generated.  [employer]. will not be held responsible for the validity of the ICD and CPT codes included on this report.  AMA assumes no liability for data contained or not contained herein. CPT is a Designer, television/film set of the [employer].

## 2015-08-21 NOTE — H&P (Signed)
  Primary Care Physician:  Asencion Noble, MD Primary Gastroenterologist:  Dr. Oneida Alar  Pre-Procedure History & Physical: HPI:  Charles Evans is a 60 y.o. male here for dyspepsia & screening.  Past Medical History  Diagnosis Date  . Hypertension     Past Surgical History  Procedure Laterality Date  . Hernia repair      left groin    Prior to Admission medications   Medication Sig Start Date End Date Taking? Authorizing Provider  aspirin EC 81 MG tablet Take 81 mg by mouth daily.   Yes Historical Provider, MD  chlorproMAZINE (THORAZINE) 25 MG tablet Take 1 tablet (25 mg total) by mouth 3 (three) times daily. 08/02/15  Yes Carmin Muskrat, MD  lisinopril (PRINIVIL,ZESTRIL) 10 MG tablet Take 10 mg by mouth daily.     Yes Historical Provider, MD  polyethylene glycol-electrolytes (NULYTELY/GOLYTELY) 420 g solution Take 4,000 mLs by mouth once. 08/15/15  Yes Mahala Menghini, PA-C    Allergies as of 08/15/2015 - Review Complete 08/15/2015  Allergen Reaction Noted  . Penicillins Swelling and Rash 03/19/2011    Family History  Problem Relation Age of Onset  . Heart disease Father   . Colon cancer Cousin     less than 60    Social History   Social History  . Marital Status: Married    Spouse Name: N/A  . Number of Children: 3  . Years of Education: N/A   Occupational History  . Fixer    Social History Main Topics  . Smoking status: Current Every Day Smoker -- 1.00 packs/day for 20 years    Types: Cigarettes  . Smokeless tobacco: Never Used  . Alcohol Use: No  . Drug Use: No  . Sexual Activity: Yes    Birth Control/ Protection: None   Other Topics Concern  . Not on file   Social History Narrative    Review of Systems: See HPI, otherwise negative ROS   Physical Exam: BP 135/90 mmHg  Pulse 85  Temp(Src) 98.8 F (37.1 C) (Oral)  SpO2 98% General:   Alert,  pleasant and cooperative in NAD Head:  Normocephalic and atraumatic. Neck:  Supple; Lungs:  Clear throughout  to auscultation.    Heart:  Regular rate and rhythm. Abdomen:  Soft, nontender and nondistended. Normal bowel sounds, without guarding, and without rebound.   Neurologic:  Alert and  oriented x4;  grossly normal neurologically.  Impression/Plan:    SCREENING/dyspepsia  Plan:  1. TCS/EGD TODAY

## 2015-08-21 NOTE — Op Note (Signed)
[facility] [address] [place], [address]   COLONOSCOPY PROCEDURE REPORT  PATIENT: Charles Evans, Charles Evans  MR#: [id number] BIRTHDATE: [birth year] , 36  yrs. old GENDER: male ENDOSCOPIST: Danie Binder, MD REFERRED Twanna Hy, M.D. PROCEDURE DATE:  Aug 24, 2015 PROCEDURE:   Colonoscopy, screening INDICATIONS:average risk patient for colon cancer. MEDICATIONS: Demerol 75 mg IV and Versed 5 mg IV  MD INITATED SEDATION: 1455 PROCEDURE COMPLETE 1550  DESCRIPTION OF PROCEDURE:    Physical exam was performed.  Informed consent was obtained from the patient after explaining the benefits, risks, and alternatives to procedure.  The patient was connected to monitor and placed in left lateral position. Continuous oxygen was provided by nasal cannula and IV medicine administered through an indwelling cannula.  After administration of sedation and rectal exam, the patients rectum was intubated and the [id number])  colonoscope was advanced under direct visualization to the cecum.  The scope was removed slowly by carefully examining the color, texture, anatomy, and integrity mucosa on the way out.  The patient was recovered in endoscopy and discharged home in satisfactory condition. Estimated blood loss is zero unless otherwise noted in this procedure report.    COLON FINDINGS: The colonic mucosa appeared normal throughout the entire examined colon and Large internal hemorrhoids were found.   PREP QUALITY: good.  CECAL W/D TIME: 10       minutes  COMPLICATIONS: None  ENDOSCOPIC IMPRESSION: 1.   The colonic mucosa appeared normal 2.   Large internal hemorrhoids  RECOMMENDATIONS: FOLLOW A HIGH FIBER/LOW FAT DIET. AVOID ITEMS THAT TRIGGER GASTRITIS. AWAIT BIOPSY RESULTS. Next colonoscopy in 10 years.      _______________________________ eSignedDanie Binder, MD 08/24/15 3:53 PM    CPT CODES: ICD CODES:  The ICD and CPT codes recommended by this  software are interpretations from the data that the clinical staff has captured with the software.  The verification of the translation of this report to the ICD and CPT codes and modifiers is the sole responsibility of the health care institution and practicing physician where this report was generated.  [employer] [facility] will not be held responsible for the validity of the ICD and CPT codes included on this report.  AMA assumes no liability for data contained or not contained herein. CPT is a Designer, television/film set of the [employer].

## 2015-08-22 NOTE — Progress Notes (Signed)
Quick Note:  Please let patient know his cxr was ok.  I would recommend completed current RX of thorazine and then discontinue.  Call if recurrent hiccups. ______

## 2015-08-23 NOTE — Progress Notes (Signed)
Quick Note:  LMOM to call. ______ 

## 2015-08-24 NOTE — Progress Notes (Signed)
Quick Note:  LMOM to call. ______ 

## 2015-08-25 ENCOUNTER — Encounter (HOSPITAL_COMMUNITY): Payer: Self-pay | Admitting: Gastroenterology

## 2015-08-25 NOTE — Progress Notes (Signed)
Quick Note:  Pt is aware of results. ______ 

## 2015-09-07 ENCOUNTER — Telehealth: Payer: Self-pay | Admitting: Gastroenterology

## 2015-09-07 NOTE — Telephone Encounter (Signed)
Reminder in epic °

## 2015-09-07 NOTE — Telephone Encounter (Signed)
Pt is aware of results. 

## 2015-09-07 NOTE — Telephone Encounter (Signed)
Please call pt. His stomach Bx shows gastritis DUE TO STEROID USE.    FOLLOW A HIGH FIBER/LOW FAT DIET. AVOID ITEMS THAT CAUSE BLOATING.   AVOID ITEMS THAT TRIGGER GASTRITIS.   Next colonoscopy in 10 years.

## 2017-01-05 ENCOUNTER — Encounter (HOSPITAL_COMMUNITY): Payer: Self-pay | Admitting: Emergency Medicine

## 2017-01-05 ENCOUNTER — Emergency Department (HOSPITAL_COMMUNITY)
Admission: EM | Admit: 2017-01-05 | Discharge: 2017-01-05 | Disposition: A | Discharge status: Home / Self Care | Payer: BLUE CROSS/BLUE SHIELD | Attending: Emergency Medicine | Admitting: Emergency Medicine

## 2017-01-05 DIAGNOSIS — Y999 Unspecified external cause status: Secondary | ICD-10-CM | POA: Insufficient documentation

## 2017-01-05 DIAGNOSIS — Y9389 Activity, other specified: Secondary | ICD-10-CM | POA: Insufficient documentation

## 2017-01-05 DIAGNOSIS — Z7982 Long term (current) use of aspirin: Secondary | ICD-10-CM | POA: Insufficient documentation

## 2017-01-05 DIAGNOSIS — F17210 Nicotine dependence, cigarettes, uncomplicated: Secondary | ICD-10-CM | POA: Insufficient documentation

## 2017-01-05 DIAGNOSIS — Y929 Unspecified place or not applicable: Secondary | ICD-10-CM | POA: Insufficient documentation

## 2017-01-05 DIAGNOSIS — I10 Essential (primary) hypertension: Secondary | ICD-10-CM | POA: Insufficient documentation

## 2017-01-05 DIAGNOSIS — Z23 Encounter for immunization: Secondary | ICD-10-CM | POA: Insufficient documentation

## 2017-01-05 DIAGNOSIS — S80812A Abrasion, left lower leg, initial encounter: Secondary | ICD-10-CM

## 2017-01-05 DIAGNOSIS — W28XXXA Contact with powered lawn mower, initial encounter: Secondary | ICD-10-CM | POA: Insufficient documentation

## 2017-01-05 DIAGNOSIS — Z79899 Other long term (current) drug therapy: Secondary | ICD-10-CM | POA: Insufficient documentation

## 2017-01-05 MED ORDER — TRAMADOL HCL 50 MG PO TABS: 50.0000 mg | ORAL_TABLET | Freq: Four times a day (QID) | ORAL | 0 refills | Status: DC | PRN

## 2017-01-05 MED ORDER — MUPIROCIN 2 % EX OINT: 1.0000 "application " | TOPICAL_OINTMENT | Freq: Two times a day (BID) | CUTANEOUS | 0 refills | Status: DC

## 2017-01-05 MED ORDER — MUPIROCIN 2 % EX OINT
TOPICAL_OINTMENT | Freq: Once | CUTANEOUS | Status: AC
  Administered 2017-01-05: 1 via TOPICAL
  Filled 2017-01-05: qty 22

## 2017-01-05 MED ORDER — TETANUS-DIPHTH-ACELL PERTUSSIS 5-2.5-18.5 LF-MCG/0.5 IM SUSP
0.5000 mL | Freq: Once | INTRAMUSCULAR | Status: AC
  Administered 2017-01-05: 0.5 mL via INTRAMUSCULAR
  Filled 2017-01-05: qty 0.5

## 2017-01-05 NOTE — Discharge Instructions (Signed)
Use mild soap and water twice daily, then pat dry and apply a thin layer of the antibiotic ointment prescribed for the next 7 days (or until a good scab has formed on the wound).  Keep clean (using "no stick telfa" pads) until scabbing has occurred, especially when working outdoors.  You may leave it open when at home.  Elevation can help with pain relief.  You may take ibuprofen for pain (motrin or advil).  I have also prescribed a pain reliever if needed for additional pain relief.  This medicine can make you drowsy - do not drive or operate dangerous equipment within 4 hours of taking this medicine.

## 2017-01-05 NOTE — ED Notes (Signed)
Pharmacy recalled for medication.

## 2017-01-05 NOTE — ED Triage Notes (Signed)
Patient states that a tree limb hit him in the left leg yesterday while mowing the lawn. States limb tore him off the mower and scraped up his left leg. Sates stiffness and and pain when putting weight on leg. Patient ambulating in triage. NAD.

## 2017-01-05 NOTE — ED Notes (Signed)
Telfa, kerlix, and ace bandage used to dress abrasion-patient tolerated well.

## 2017-01-05 NOTE — ED Notes (Signed)
Waiting med from pharmacy. Pharmacy aware.

## 2017-01-09 NOTE — ED Provider Notes (Signed)
[place] DEPT Provider Note   CSN: [id number] Arrival date & time: 01/05/17  1004     History   Chief Complaint Chief Complaint  Patient presents with  . Leg Injury    HPI Charles Evans is a 61 y.o. male presenting for evaluation of a large abrasion on his left anterior lower leg which occurred yesterday.  He was on a riding lawn mower when he scraped the leg along a broken tree branch which was hidden by foliage. He has cleaned the wound with hydrogen peroxide and has bandaged with no stick dressings.  He woke today with yellow drainage from the wound and is concerned about possible infection in the wound. Additionally he is unsure of his tetanus status.  HPI  Past Medical History:  Diagnosis Date  . Hypertension     Patient Active Problem List   Diagnosis Date Noted  . Encounter for screening colonoscopy   . Intractable hiccups 08/15/2015  . Cough 08/15/2015  . Colon cancer screening 08/15/2015  . Dyspepsia 08/15/2015  . Chest pain 05/29/2012  . Atrial fibrillation ([place]) 05/29/2012  . HTN (hypertension) 05/29/2012  . Hyperglycemia 05/29/2012  . Hypokalemia 05/29/2012    Past Surgical History:  Procedure Laterality Date  . COLONOSCOPY N/A 08/21/2015   Procedure: COLONOSCOPY;  Surgeon: Danie Binder, MD;  [facility];  Service: Endoscopy;  Laterality: N/A;  1430  . ESOPHAGOGASTRODUODENOSCOPY N/A 08/21/2015   Procedure: ESOPHAGOGASTRODUODENOSCOPY (EGD);  Surgeon: Danie Binder, MD;  Location: [facility];  Service: Endoscopy;  Laterality: N/A;  . HERNIA REPAIR     left groin       Home Medications    Prior to Admission medications   Medication Sig Start Date End Date Taking? Authorizing Provider  aspirin EC 81 MG tablet Take 81 mg by mouth daily.   Yes [provider]  lisinopril (PRINIVIL,ZESTRIL) 10 MG tablet Take 10 mg by mouth daily.     Yes [provider]  mupirocin ointment (BACTROBAN) 2 % Apply 1 application  topically 2 (two) times daily. 01/05/17   Evalee Jefferson, PA-C  traMADol (ULTRAM) 50 MG tablet Take 1 tablet (50 mg total) by mouth every 6 (six) hours as needed. 01/05/17   Evalee Jefferson, PA-C    Family History Family History  Problem Relation Age of Onset  . Heart disease Father   . Colon cancer Cousin        less than 60    Social History Social History  Substance Use Topics  . Smoking status: Current Every Day Smoker    Packs/day: 1.00    Years: 20.00    Types: Cigarettes  . Smokeless tobacco: Never Used  . Alcohol use No     Allergies   Decadron [dexamethasone] and Penicillins   Review of Systems Review of Systems  Constitutional: Negative for chills and fever.  HENT: Negative.   Respiratory: Negative.   Cardiovascular: Negative.   Gastrointestinal: Negative.   Musculoskeletal: Negative for arthralgias.  Skin: Positive for wound.  Neurological: Negative for numbness.     Physical Exam Updated Vital Signs BP 116/75 (BP Location: Left Arm)   Pulse 68   Temp 98.3 F (36.8 C) (Oral)   Resp 18   Ht 6\' 4"  (1.93 m)   Wt 86.6 kg (191 lb)   SpO2 96%   BMI 23.25 kg/m   Physical Exam  Constitutional: He is oriented to person, place, and time. He appears well-developed and well-nourished.  HENT:  Head: Normocephalic.  Cardiovascular: Normal rate.   Pulmonary/Chest: Effort normal.  Musculoskeletal: He exhibits no edema, tenderness or deformity.  Neurological: He is alert and oriented to person, place, and time. No sensory deficit.  Skin: Abrasion noted.  Long, linear superficial abrasion left anterior shin. Healthy appearing granulation tissue.  Abrasion approx 10 cm in length, 2 cm in width, clear yellow dc dried at inferior border. No surrounding erythema.     ED Treatments / Results  Labs (all labs ordered are listed, but only abnormal results are displayed) Labs Reviewed - No data to display  EKG  EKG Interpretation None       Radiology No results  found.  Procedures Procedures (including critical care time)  Medications Ordered in ED Medications  Tdap (BOOSTRIX) injection 0.5 mL (0.5 mLs Intramuscular Given 01/05/17 1115)  mupirocin ointment (BACTROBAN) 2 % (1 application Topical Given 01/05/17 1228)     Initial Impression / Assessment and Plan / ED Course  I have reviewed the triage vital signs and the nursing notes.  Pertinent labs & imaging results that were available during my care of the patient were reviewed by me and considered in my medical decision making (see chart for details).     Pt with superficial abrasion, possible early staph infection. bactroban topical bid after soap and water wash, dressings. Tetanus updated.  Recheck here or by pcp for any worsened sx.   Final Clinical Impressions(s) / ED Diagnoses   Final diagnoses:  Abrasion of left lower extremity, initial encounter    New Prescriptions Discharge Medication List as of 01/05/2017 11:07 AM    START taking these medications   Details  mupirocin ointment (BACTROBAN) 2 % Apply 1 application topically 2 (two) times daily., Starting Sun 01/05/2017, Print    traMADol (ULTRAM) 50 MG tablet Take 1 tablet (50 mg total) by mouth every 6 (six) hours as needed., Starting Sun 01/05/2017, Print         Evalee Jefferson, PA-C 01/09/17 1035    Julianne Rice, MD 01/12/17 2348

## 2017-02-26 ENCOUNTER — Emergency Department (HOSPITAL_COMMUNITY)
Admission: EM | Admit: 2017-02-26 | Discharge: 2017-02-26 | Disposition: A | Discharge status: Home / Self Care | Payer: BLUE CROSS/BLUE SHIELD | Attending: Emergency Medicine | Admitting: Emergency Medicine

## 2017-02-26 ENCOUNTER — Encounter (HOSPITAL_COMMUNITY): Payer: Self-pay | Admitting: Cardiology

## 2017-02-26 DIAGNOSIS — Z7982 Long term (current) use of aspirin: Secondary | ICD-10-CM | POA: Insufficient documentation

## 2017-02-26 DIAGNOSIS — M5442 Lumbago with sciatica, left side: Secondary | ICD-10-CM | POA: Insufficient documentation

## 2017-02-26 DIAGNOSIS — Z79899 Other long term (current) drug therapy: Secondary | ICD-10-CM | POA: Insufficient documentation

## 2017-02-26 DIAGNOSIS — M5432 Sciatica, left side: Secondary | ICD-10-CM

## 2017-02-26 DIAGNOSIS — F17210 Nicotine dependence, cigarettes, uncomplicated: Secondary | ICD-10-CM | POA: Insufficient documentation

## 2017-02-26 DIAGNOSIS — I10 Essential (primary) hypertension: Secondary | ICD-10-CM | POA: Insufficient documentation

## 2017-02-26 MED ORDER — METHOCARBAMOL 500 MG PO TABS
1000.0000 mg | ORAL_TABLET | Freq: Once | ORAL | Status: AC
  Administered 2017-02-26: 1000 mg via ORAL
  Filled 2017-02-26: qty 2

## 2017-02-26 MED ORDER — KETOROLAC TROMETHAMINE 60 MG/2ML IM SOLN
60.0000 mg | Freq: Once | INTRAMUSCULAR | Status: AC
  Administered 2017-02-26: 60 mg via INTRAMUSCULAR
  Filled 2017-02-26: qty 2

## 2017-02-26 MED ORDER — IBUPROFEN 600 MG PO TABS
600.0000 mg | ORAL_TABLET | Freq: Four times a day (QID) | ORAL | 0 refills | Status: DC | PRN
Start: 2017-02-26 — End: 2017-11-24

## 2017-02-26 MED ORDER — METHOCARBAMOL 500 MG PO TABS: 1000.0000 mg | ORAL_TABLET | Freq: Four times a day (QID) | ORAL | 0 refills | Status: DC | PRN

## 2017-02-26 NOTE — ED Triage Notes (Signed)
Lower back pain that radiates down left leg times 2 days.

## 2017-03-02 NOTE — ED Provider Notes (Signed)
[facility] DEPT Provider Note   CSN: [id number] Arrival date & time: 02/26/17  7341     History   Chief Complaint Chief Complaint  Patient presents with  . Back Pain    HPI Charles Evans is a 61 y.o. male.  HPI Patient presents with worsening low back pain radiating to the left thigh for the past few days. Denies any numbness or weakness. States he does a lot of heavy lifting at work. He's had no urinary bowel incontinence. Denies any fever or chills. Denies any IV drug use. Denies lower extremity swelling.  Past Medical History:  Diagnosis Date  . Hypertension     Patient Active Problem List   Diagnosis Date Noted  . Encounter for screening colonoscopy   . Intractable hiccups 08/15/2015  . Cough 08/15/2015  . Colon cancer screening 08/15/2015  . Dyspepsia 08/15/2015  . Chest pain 05/29/2012  . Atrial fibrillation ([place]) 05/29/2012  . HTN (hypertension) 05/29/2012  . Hyperglycemia 05/29/2012  . Hypokalemia 05/29/2012    Past Surgical History:  Procedure Laterality Date  . COLONOSCOPY N/A 08/21/2015   Procedure: COLONOSCOPY;  Surgeon: Danie Binder, MD;  [facility];  Service: Endoscopy;  Laterality: N/A;  1430  . ESOPHAGOGASTRODUODENOSCOPY N/A 08/21/2015   Procedure: ESOPHAGOGASTRODUODENOSCOPY (EGD);  Surgeon: Danie Binder, MD;  Location: [facility];  Service: Endoscopy;  Laterality: N/A;  . HERNIA REPAIR     left groin       Home Medications    Prior to Admission medications   Medication Sig Start Date End Date Taking? Authorizing Provider  amLODipine (NORVASC) 10 MG tablet Take 1 tablet by mouth daily. 01/30/17  Yes [provider]  aspirin EC 81 MG tablet Take 81 mg by mouth daily.   Yes [provider]  lisinopril-hydrochlorothiazide (PRINZIDE,ZESTORETIC) 20-25 MG tablet Take 1 tablet by mouth daily. 01/30/17  Yes [provider]  ibuprofen (ADVIL,MOTRIN) 600 MG tablet Take 1 tablet (600 mg total) by mouth every  6 (six) hours as needed. 02/26/17   Julianne Rice, MD  methocarbamol (ROBAXIN) 500 MG tablet Take 2 tablets (1,000 mg total) by mouth every 6 (six) hours as needed for muscle spasms. 02/26/17   Julianne Rice, MD  mupirocin ointment (BACTROBAN) 2 % Apply 1 application topically 2 (two) times daily. Patient not taking: Reported on 02/26/2017 01/05/17   Evalee Jefferson, PA-C  traMADol (ULTRAM) 50 MG tablet Take 1 tablet (50 mg total) by mouth every 6 (six) hours as needed. Patient not taking: Reported on 02/26/2017 01/05/17   Evalee Jefferson, PA-C    Family History Family History  Problem Relation Age of Onset  . Heart disease Father   . Colon cancer Cousin        less than 60    Social History Social History  Substance Use Topics  . Smoking status: Current Every Day Smoker    Packs/day: 1.00    Years: 20.00    Types: Cigarettes  . Smokeless tobacco: Never Used  . Alcohol use No     Allergies   Decadron [dexamethasone] and Penicillins   Review of Systems Review of Systems  Constitutional: Negative for chills and fever.  Respiratory: Negative for cough and shortness of breath.   Cardiovascular: Negative for chest pain.  Gastrointestinal: Negative for abdominal pain, constipation, nausea and vomiting.  Genitourinary: Negative for difficulty urinating.  Musculoskeletal: Positive for back pain and myalgias. Negative for joint swelling, neck pain and neck stiffness.  Skin: Negative for  rash and wound.  Neurological: Negative for weakness and numbness.  All other systems reviewed and are negative.    Physical Exam Updated Vital Signs BP (!) 142/89   Pulse 74   Temp 98 F (36.7 C) (Oral)   Resp 16   Ht 6\' 4"  (1.93 m)   Wt 88.5 kg (195 lb)   SpO2 96%   BMI 23.74 kg/m   Physical Exam  Constitutional: He is oriented to person, place, and time. He appears well-developed and well-nourished.  HENT:  Head: Normocephalic and atraumatic.  Mouth/Throat: Oropharynx is clear and moist.    Eyes: Pupils are equal, round, and reactive to light. EOM are normal.  Neck: Normal range of motion. Neck supple.  Cardiovascular: Normal rate and regular rhythm.   Pulmonary/Chest: Effort normal and breath sounds normal.  Abdominal: Soft. Bowel sounds are normal. There is no tenderness. There is no rebound and no guarding.  Musculoskeletal: Normal range of motion. He exhibits tenderness. He exhibits no edema.  Patient has mild left-sided lumbar paraspinal tenderness to palpation. There is no midline thoracic or lumbar tenderness, no step-offs or deformities. Negative straight leg raise bilaterally. Lower extremities are symmetric without calf swelling or tenderness. Distal pulses are 2+ in bilateral lower extremities. No CVA tenderness bilaterally.  Neurological: He is alert and oriented to person, place, and time.  Skin: Skin is warm and dry. Capillary refill takes less than 2 seconds. No rash noted. No erythema.  Psychiatric: He has a normal mood and affect. His behavior is normal.  Nursing note and vitals reviewed.    ED Treatments / Results  Labs (all labs ordered are listed, but only abnormal results are displayed) Labs Reviewed - No data to display  EKG  EKG Interpretation None       Radiology No results found.  Procedures Procedures (including critical care time)  Medications Ordered in ED Medications  ketorolac (TORADOL) injection 60 mg (60 mg Intramuscular Given 02/26/17 1130)  methocarbamol (ROBAXIN) tablet 1,000 mg (1,000 mg Oral Given 02/26/17 1130)     Initial Impression / Assessment and Plan / ED Course  I have reviewed the triage vital signs and the nursing notes.  Pertinent labs & imaging results that were available during my care of the patient were reviewed by me and considered in my medical decision making (see chart for details).     Patient with low back pain radiating to the left posterior thigh. No red flag signs or symptoms. Have offered MRI but  patient states he wants to wait to get MRI until he has insurance. He will follow-up as an outpatient with his primary provider. He's been given return precautions and has voiced understanding.  Final Clinical Impressions(s) / ED Diagnoses   Final diagnoses:  Sciatica of left side    New Prescriptions Discharge Medication List as of 02/26/2017 12:46 PM    START taking these medications   Details  ibuprofen (ADVIL,MOTRIN) 600 MG tablet Take 1 tablet (600 mg total) by mouth every 6 (six) hours as needed., Starting Wed 02/26/2017, Print    methocarbamol (ROBAXIN) 500 MG tablet Take 2 tablets (1,000 mg total) by mouth every 6 (six) hours as needed for muscle spasms., Starting Wed 02/26/2017, Print         Julianne Rice, MD 03/02/17 1432

## 2017-11-24 ENCOUNTER — Emergency Department (HOSPITAL_COMMUNITY)
Admission: EM | Admit: 2017-11-24 | Discharge: 2017-11-24 | Disposition: A | Discharge status: Home / Self Care | Payer: BLUE CROSS/BLUE SHIELD | Attending: Emergency Medicine | Admitting: Emergency Medicine

## 2017-11-24 ENCOUNTER — Other Ambulatory Visit: Payer: Self-pay

## 2017-11-24 ENCOUNTER — Encounter (HOSPITAL_COMMUNITY): Payer: Self-pay

## 2017-11-24 ENCOUNTER — Emergency Department (HOSPITAL_COMMUNITY): Payer: BLUE CROSS/BLUE SHIELD

## 2017-11-24 DIAGNOSIS — S3992XA Unspecified injury of lower back, initial encounter: Secondary | ICD-10-CM | POA: Diagnosis not present

## 2017-11-24 DIAGNOSIS — X500XXA Overexertion from strenuous movement or load, initial encounter: Secondary | ICD-10-CM | POA: Insufficient documentation

## 2017-11-24 DIAGNOSIS — Y990 Civilian activity done for income or pay: Secondary | ICD-10-CM | POA: Diagnosis not present

## 2017-11-24 DIAGNOSIS — I10 Essential (primary) hypertension: Secondary | ICD-10-CM | POA: Diagnosis not present

## 2017-11-24 DIAGNOSIS — Y9289 Other specified places as the place of occurrence of the external cause: Secondary | ICD-10-CM | POA: Insufficient documentation

## 2017-11-24 DIAGNOSIS — Z79899 Other long term (current) drug therapy: Secondary | ICD-10-CM | POA: Diagnosis not present

## 2017-11-24 DIAGNOSIS — F17210 Nicotine dependence, cigarettes, uncomplicated: Secondary | ICD-10-CM | POA: Insufficient documentation

## 2017-11-24 DIAGNOSIS — Z7982 Long term (current) use of aspirin: Secondary | ICD-10-CM | POA: Diagnosis not present

## 2017-11-24 DIAGNOSIS — S39012A Strain of muscle, fascia and tendon of lower back, initial encounter: Secondary | ICD-10-CM | POA: Insufficient documentation

## 2017-11-24 DIAGNOSIS — Y9389 Activity, other specified: Secondary | ICD-10-CM | POA: Diagnosis not present

## 2017-11-24 MED ORDER — METHOCARBAMOL 500 MG PO TABS: 500.0000 mg | ORAL_TABLET | Freq: Two times a day (BID) | ORAL | 0 refills | Status: DC

## 2017-11-24 MED ORDER — DICLOFENAC SODIUM 50 MG PO TBEC: 50.0000 mg | DELAYED_RELEASE_TABLET | Freq: Two times a day (BID) | ORAL | 0 refills | Status: DC

## 2017-11-24 NOTE — Discharge Instructions (Signed)
Return if any problems.  See Dr. Aline Brochure if pain

## 2017-11-24 NOTE — ED Triage Notes (Signed)
Pt is having left sided back pain that started Thursday. Pt was pulling a pale at work when this happened. Describes pain as excruciating with movement. Rates pain as a 9/10. Has taken OTC Tylenol. Took last at 0830.

## 2017-11-25 NOTE — ED Provider Notes (Signed)
[facility] EMERGENCY DEPARTMENT Provider Note   CSN: [id number] Arrival date & time: 11/24/17  1440     History   Chief Complaint Chief Complaint  Patient presents with  . Back Pain    HPI Charles Evans is a 62 y.o. male.  The history is provided by the patient. No language interpreter was used.  Back Pain   This is a new problem. The current episode started yesterday. The problem occurs constantly. The problem has been gradually worsening. The pain is present in the lumbar spine. The quality of the pain is described as aching. The pain does not radiate. The pain is at a severity of 6/10. The pain is worse during the day. Associated symptoms include abdominal swelling. He has tried nothing for the symptoms. The treatment provided no relief.  Pt reports pulling a heavy object at work.  Pt report pain in low back.   Past Medical History:  Diagnosis Date  . Hypertension     Patient Active Problem List   Diagnosis Date Noted  . Encounter for screening colonoscopy   . Intractable hiccups 08/15/2015  . Cough 08/15/2015  . Colon cancer screening 08/15/2015  . Dyspepsia 08/15/2015  . Chest pain 05/29/2012  . Atrial fibrillation ([place]) 05/29/2012  . HTN (hypertension) 05/29/2012  . Hyperglycemia 05/29/2012  . Hypokalemia 05/29/2012    Past Surgical History:  Procedure Laterality Date  . COLONOSCOPY N/A 08/21/2015   Procedure: COLONOSCOPY;  Surgeon: Danie Binder, MD;  [facility];  Service: Endoscopy;  Laterality: N/A;  1430  . ESOPHAGOGASTRODUODENOSCOPY N/A 08/21/2015   Procedure: ESOPHAGOGASTRODUODENOSCOPY (EGD);  Surgeon: Danie Binder, MD;  [facility];  Service: Endoscopy;  Laterality: N/A;  . HERNIA REPAIR     left groin        Home Medications    Prior to Admission medications   Medication Sig Start Date End Date Taking? Authorizing Provider  amLODipine (NORVASC) 10 MG tablet Take 1 tablet by mouth daily. 01/30/17  Yes [provider]  aspirin EC 81 MG tablet Take 81 mg by mouth daily.   Yes [provider]  lisinopril-hydrochlorothiazide (PRINZIDE,ZESTORETIC) 20-25 MG tablet Take 1 tablet by mouth daily. 01/30/17  Yes [provider]  diclofenac (VOLTAREN) 50 MG EC tablet Take 1 tablet (50 mg total) by mouth 2 (two) times daily. 11/24/17   Fransico Meadow, PA-C  methocarbamol (ROBAXIN) 500 MG tablet Take 1 tablet (500 mg total) by mouth 2 (two) times daily. 11/24/17   Fransico Meadow, PA-C    Family History Family History  Problem Relation Age of Onset  . Heart disease Father   . Colon cancer Cousin        less than 60    Social History Social History   Tobacco Use  . Smoking status: Current Every Day Smoker    Packs/day: 1.00    Years: 20.00    Pack years: 20.00    Types: Cigarettes  . Smokeless tobacco: Never Used  Substance Use Topics  . Alcohol use: No  . Drug use: No     Allergies   Decadron [dexamethasone] and Penicillins   Review of Systems Review of Systems  Musculoskeletal: Positive for back pain.  All other systems reviewed and are negative.    Physical Exam Updated Vital Signs BP (!) 149/107 (BP Location: Right Arm)   Pulse (!) 56   Temp 97.9 F (36.6 C) (Oral)   Resp 14   Ht  6\' 4"  (1.93 m)   Wt 92.1 kg (203 lb)   SpO2 99%   BMI 24.71 kg/m   Physical Exam  Constitutional: He appears well-developed and well-nourished.  HENT:  Head: Normocephalic and atraumatic.  Eyes: Conjunctivae are normal.  Neck: Neck supple.  Cardiovascular: Normal rate and regular rhythm.  No murmur heard. Pulmonary/Chest: Effort normal and breath sounds normal. No respiratory distress.  Abdominal: Soft. There is no tenderness.  Musculoskeletal: Normal range of motion. He exhibits tenderness. He exhibits no edema.  Diffusely tender low back and left buttock,  Pain with movement, nv and ns intact   Neurological: He is alert.  Skin: Skin is warm and dry.  Psychiatric: He  has a normal mood and affect.  Nursing note and vitals reviewed.    ED Treatments / Results  Labs (all labs ordered are listed, but only abnormal results are displayed) Labs Reviewed - No data to display  EKG None  Radiology Dg Lumbar Spine Complete  Result Date: 11/24/2017 CLINICAL DATA:  Left-sided back pain for several days EXAM: LUMBAR SPINE - COMPLETE 4+ VIEW COMPARISON:  Lumbar spine films of 07/09/2015 FINDINGS: The lumbar vertebrae remain somewhat straightened in alignment. There is mild degenerative disc disease at L2-3 and L3-4. No compression deformity is seen. On oblique views only mild degenerative change of the facet joints of L4-5 and L5-S1 is noted. The SI joints are corticated. IMPRESSION: 1. No change in straightened alignment of the lumbar vertebrae with no acute abnormality. 2. Mild degenerative disc disease at L2-3 and L3. Electronically Signed   By: Ivar Drape M.D.   On: 11/24/2017 16:17    Procedures Procedures (including critical care time)  Medications Ordered in ED Medications - No data to display   Initial Impression / Assessment and Plan / ED Course  I have reviewed the triage vital signs and the nursing notes.  Pertinent labs & imaging results that were available during my care of the patient were reviewed by me and considered in my medical decision making (see chart for details).     MDM  Pt counseled on muscle strain.  Pt advised to follow up with Dr. Aline Brochure if pain persit  Final Clinical Impressions(s) / ED Diagnoses   Final diagnoses:  Strain of lumbar region, initial encounter    ED Discharge Orders        Ordered    methocarbamol (ROBAXIN) 500 MG tablet  2 times daily     11/24/17 1648    diclofenac (VOLTAREN) 50 MG EC tablet  2 times daily     11/24/17 1648    An After Visit Summary was printed and given to the patient.    Fransico Meadow, PA-C 11/25/17 0940    Nat Christen, MD 12/02/17 [id number]

## 2018-02-22 ENCOUNTER — Other Ambulatory Visit: Payer: Self-pay

## 2018-02-22 ENCOUNTER — Encounter (HOSPITAL_COMMUNITY): Payer: Self-pay | Admitting: Emergency Medicine

## 2018-02-22 ENCOUNTER — Emergency Department (HOSPITAL_COMMUNITY)
Admission: EM | Admit: 2018-02-22 | Discharge: 2018-02-22 | Disposition: A | Discharge status: Home / Self Care | Payer: BLUE CROSS/BLUE SHIELD | Attending: Emergency Medicine | Admitting: Emergency Medicine

## 2018-02-22 DIAGNOSIS — I10 Essential (primary) hypertension: Secondary | ICD-10-CM | POA: Diagnosis not present

## 2018-02-22 DIAGNOSIS — F17210 Nicotine dependence, cigarettes, uncomplicated: Secondary | ICD-10-CM | POA: Diagnosis not present

## 2018-02-22 DIAGNOSIS — Z79899 Other long term (current) drug therapy: Secondary | ICD-10-CM | POA: Diagnosis not present

## 2018-02-22 DIAGNOSIS — R202 Paresthesia of skin: Secondary | ICD-10-CM | POA: Diagnosis not present

## 2018-02-22 DIAGNOSIS — Z7982 Long term (current) use of aspirin: Secondary | ICD-10-CM | POA: Insufficient documentation

## 2018-02-22 DIAGNOSIS — M5442 Lumbago with sciatica, left side: Secondary | ICD-10-CM | POA: Diagnosis not present

## 2018-02-22 DIAGNOSIS — M5432 Sciatica, left side: Secondary | ICD-10-CM | POA: Diagnosis not present

## 2018-02-22 DIAGNOSIS — M79605 Pain in left leg: Secondary | ICD-10-CM | POA: Diagnosis not present

## 2018-02-22 MED ORDER — CYCLOBENZAPRINE HCL 10 MG PO TABS: 10.0000 mg | ORAL_TABLET | Freq: Three times a day (TID) | ORAL | 0 refills | Status: DC | PRN

## 2018-02-22 MED ORDER — PREDNISONE 20 MG PO TABS: 60.0000 mg | ORAL_TABLET | Freq: Every day | ORAL | 0 refills | Status: DC

## 2018-02-22 MED ORDER — PREDNISONE 50 MG PO TABS
60.0000 mg | ORAL_TABLET | Freq: Once | ORAL | Status: AC
  Administered 2018-02-22: 60 mg via ORAL
  Filled 2018-02-22: qty 1

## 2018-02-22 MED ORDER — IBUPROFEN 800 MG PO TABS
800.0000 mg | ORAL_TABLET | Freq: Once | ORAL | Status: AC
  Administered 2018-02-22: 800 mg via ORAL
  Filled 2018-02-22: qty 1

## 2018-02-22 MED ORDER — CYCLOBENZAPRINE HCL 10 MG PO TABS
10.0000 mg | ORAL_TABLET | Freq: Once | ORAL | Status: AC
  Administered 2018-02-22: 10 mg via ORAL
  Filled 2018-02-22: qty 1

## 2018-02-22 NOTE — ED Provider Notes (Signed)
[facility] EMERGENCY DEPARTMENT Provider Note   CSN: [id number] Arrival date & time: 02/22/18  1941     History   Chief Complaint Chief Complaint  Patient presents with  . Leg Pain    left    HPI Charles Evans is a 63 y.o. male.  He is complaining of injury to his low back at work about 3 months ago.  Since then he has had low back pain and is using a brace.  This acutely got worse about 2 weeks ago and much worse the last 1 week.  He is having new numbness of his anterior left lower leg wrapping into his foot and increased pain in his back and a toothache sensation through his left thigh.  He is having more difficulty ambulating and doing any lifting.  There is no bowel bladder incontinence.  No abdominal pain.  No fevers no chills.  He has been unable to work for last few days.  The history is provided by the patient.  Leg Pain   This is a new problem. The current episode started more than 1 week ago. The problem occurs constantly. The problem has not changed since onset.The pain is present in the left lower leg and left upper leg. The pain is severe. Associated symptoms include numbness, limited range of motion and tingling. The symptoms are aggravated by activity. He has tried OTC pain medications and heat for the symptoms. The treatment provided no relief. There has been a history of trauma.    Past Medical History:  Diagnosis Date  . Hypertension     Patient Active Problem List   Diagnosis Date Noted  . Encounter for screening colonoscopy   . Intractable hiccups 08/15/2015  . Cough 08/15/2015  . Colon cancer screening 08/15/2015  . Dyspepsia 08/15/2015  . Chest pain 05/29/2012  . Atrial fibrillation ([place]) 05/29/2012  . HTN (hypertension) 05/29/2012  . Hyperglycemia 05/29/2012  . Hypokalemia 05/29/2012    Past Surgical History:  Procedure Laterality Date  . COLONOSCOPY N/A 08/21/2015   Procedure: COLONOSCOPY;  Surgeon: Danie Binder, MD;  [facility];   Service: Endoscopy;  Laterality: N/A;  1430  . ESOPHAGOGASTRODUODENOSCOPY N/A 08/21/2015   Procedure: ESOPHAGOGASTRODUODENOSCOPY (EGD);  Surgeon: Danie Binder, MD;  Location: [facility];  Service: Endoscopy;  Laterality: N/A;  . HERNIA REPAIR     left groin        Home Medications    Prior to Admission medications   Medication Sig Start Date End Date Taking? Authorizing Provider  amLODipine (NORVASC) 10 MG tablet Take 1 tablet by mouth daily. 01/30/17   [provider]  aspirin EC 81 MG tablet Take 81 mg by mouth daily.    [provider]  diclofenac (VOLTAREN) 50 MG EC tablet Take 1 tablet (50 mg total) by mouth 2 (two) times daily. 11/24/17   Fransico Meadow, PA-C  lisinopril-hydrochlorothiazide (PRINZIDE,ZESTORETIC) 20-25 MG tablet Take 1 tablet by mouth daily. 01/30/17   [provider]  methocarbamol (ROBAXIN) 500 MG tablet Take 1 tablet (500 mg total) by mouth 2 (two) times daily. 11/24/17   Fransico Meadow, PA-C    Family History Family History  Problem Relation Age of Onset  . Heart disease Father   . Colon cancer Cousin        less than 60    Social History Social History   Tobacco Use  . Smoking status: Current Every Day Smoker    Packs/day: 1.00  Years: 20.00    Pack years: 20.00    Types: Cigarettes  . Smokeless tobacco: Never Used  Substance Use Topics  . Alcohol use: No  . Drug use: No     Allergies   Decadron [dexamethasone] and Penicillins   Review of Systems Review of Systems  Constitutional: Negative for fever.  HENT: Negative for sore throat.   Eyes: Negative for visual disturbance.  Respiratory: Negative for shortness of breath.   Cardiovascular: Negative for chest pain.  Gastrointestinal: Negative for abdominal pain.  Genitourinary: Negative for dysuria.  Musculoskeletal: Positive for back pain. Negative for neck pain.  Skin: Negative for rash.  Neurological: Positive for tingling and numbness. Negative for  speech difficulty.     Physical Exam Updated Vital Signs BP (!) 172/88 (BP Location: Right Arm)   Pulse (!) 58   Temp 98.1 F (36.7 C) (Oral)   Ht 6\' 4"  (1.93 m)   Wt 93 kg (205 lb)   SpO2 97%   BMI 24.95 kg/m   Physical Exam  Constitutional: He appears well-developed and well-nourished.  HENT:  Head: Normocephalic and atraumatic.  Eyes: Conjunctivae are normal.  Neck: Neck supple.  Cardiovascular: Normal rate, regular rhythm, normal heart sounds and intact distal pulses.  Pulmonary/Chest: Effort normal. No stridor. He has no wheezes.  Abdominal: Soft. There is no tenderness. There is no guarding.  Musculoskeletal:  Patient has midline lumbar tenderness and paralumbar tenderness and spasm.  He is able to extend at the hip to 90 degrees and flex at the knee to just short of fully extended.  He is got subjective paresthesias in his lower leg on the left on the anterior leg and the medial foot.  Normal gait.  He has no specific joint tenderness.  No swelling.  Right lower extremity unaffected.  Neurological: He is alert. GCS eye subscore is 4. GCS verbal subscore is 5. GCS motor subscore is 6.  Skin: Skin is warm and dry.  Psychiatric: He has a normal mood and affect.  Nursing note and vitals reviewed.    ED Treatments / Results  Labs (all labs ordered are listed, but only abnormal results are displayed) Labs Reviewed - No data to display  EKG None  Radiology No results found.  Procedures Procedures (including critical care time)  Medications Ordered in ED Medications  predniSONE (DELTASONE) tablet 60 mg (has no administration in time range)  ibuprofen (ADVIL,MOTRIN) tablet 800 mg (has no administration in time range)  cyclobenzaprine (FLEXERIL) tablet 10 mg (has no administration in time range)     Initial Impression / Assessment and Plan / ED Course  I have reviewed the triage vital signs and the nursing notes.  Pertinent labs & imaging results that were  available during my care of the patient were reviewed by me and considered in my medical decision making (see chart for details).  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  He does not have any true motor weakness currently.  Recommended and he is agreeable to starting on some steroids along with continuing his anti-inflammatories and he will contact work regarding his follow-up options.  Also given the number for orthopedics in town if that is agreeable with his work as he is going to be under [employer].   [MB]    Clinical Course User Index [MB] Hayden Rasmussen, MD  Final Clinical Impressions(s) / ED Diagnoses   Final diagnoses:  Sciatica of left side    ED Discharge Orders        Ordered    cyclobenzaprine (FLEXERIL) 10 MG tablet  3 times daily PRN     02/22/18 2041    predniSONE (DELTASONE) 20 MG tablet  Daily     02/22/18 2041       Hayden Rasmussen, MD 02/23/18 1057

## 2018-02-22 NOTE — Discharge Instructions (Addendum)
Your evaluated in the emergency room for left-sided back pain radiating down your left leg to your foot.  This is likely sciatica.  We are prescribing you some medications to see if it will relieve the inflammation on the nerve.  He will be important for you to follow-up with your doctor as you may need further testing or physical therapy.

## 2018-02-22 NOTE — ED Triage Notes (Signed)
Pt states he was hurt at work in May that started with back pain and now is c/o left leg and numbness from his knee to ankle only

## 2018-03-19 DIAGNOSIS — Z6824 Body mass index (BMI) 24.0-24.9, adult: Secondary | ICD-10-CM | POA: Diagnosis not present

## 2018-03-19 DIAGNOSIS — M5442 Lumbago with sciatica, left side: Secondary | ICD-10-CM | POA: Diagnosis not present

## 2018-03-19 DIAGNOSIS — I10 Essential (primary) hypertension: Secondary | ICD-10-CM | POA: Diagnosis not present

## 2018-03-31 DIAGNOSIS — M5442 Lumbago with sciatica, left side: Secondary | ICD-10-CM | POA: Diagnosis not present

## 2018-03-31 DIAGNOSIS — I10 Essential (primary) hypertension: Secondary | ICD-10-CM | POA: Diagnosis not present

## 2018-03-31 DIAGNOSIS — Z6824 Body mass index (BMI) 24.0-24.9, adult: Secondary | ICD-10-CM | POA: Diagnosis not present

## 2018-04-03 ENCOUNTER — Encounter (HOSPITAL_COMMUNITY): Payer: Self-pay | Admitting: Emergency Medicine

## 2018-04-03 ENCOUNTER — Other Ambulatory Visit: Payer: Self-pay

## 2018-04-03 ENCOUNTER — Emergency Department (HOSPITAL_COMMUNITY)
Admission: EM | Admit: 2018-04-03 | Discharge: 2018-04-03 | Disposition: A | Discharge status: Home / Self Care | Payer: BLUE CROSS/BLUE SHIELD | Attending: Emergency Medicine | Admitting: Emergency Medicine

## 2018-04-03 DIAGNOSIS — F17210 Nicotine dependence, cigarettes, uncomplicated: Secondary | ICD-10-CM | POA: Insufficient documentation

## 2018-04-03 DIAGNOSIS — Z79899 Other long term (current) drug therapy: Secondary | ICD-10-CM | POA: Diagnosis not present

## 2018-04-03 DIAGNOSIS — Z7982 Long term (current) use of aspirin: Secondary | ICD-10-CM | POA: Insufficient documentation

## 2018-04-03 DIAGNOSIS — K0889 Other specified disorders of teeth and supporting structures: Secondary | ICD-10-CM | POA: Diagnosis not present

## 2018-04-03 DIAGNOSIS — I10 Essential (primary) hypertension: Secondary | ICD-10-CM | POA: Diagnosis not present

## 2018-04-03 LAB — BASIC METABOLIC PANEL
Anion gap: 9 (ref 5–15)
BUN: 14 mg/dL (ref 6–20)
CO2: 25 mmol/L (ref 22–32)
Calcium: 9 mg/dL (ref 8.9–10.3)
Chloride: 107 mmol/L (ref 98–111)
Creatinine, Ser: 1.25 mg/dL — ABNORMAL HIGH (ref 0.61–1.24)
Glucose, Bld: 148 mg/dL — ABNORMAL HIGH (ref 70–99)
POTASSIUM: 3.1 mmol/L — AB (ref 3.5–5.1)
Sodium: 141 mmol/L (ref 135–145)

## 2018-04-03 LAB — I-STAT CHEM 8, ED
BUN: 18 mg/dL (ref 6–20)
Calcium, Ion: 0.96 mmol/L — ABNORMAL LOW (ref 1.15–1.40)
Chloride: 111 mmol/L (ref 98–111)
Creatinine, Ser: 1.3 mg/dL — ABNORMAL HIGH (ref 0.61–1.24)
Glucose, Bld: 161 mg/dL — ABNORMAL HIGH (ref 70–99)
HCT: 46 % (ref 39.0–52.0)
HEMOGLOBIN: 15.6 g/dL (ref 13.0–17.0)
Potassium: 5.6 mmol/L — ABNORMAL HIGH (ref 3.5–5.1)
Sodium: 139 mmol/L (ref 135–145)
TCO2: 27 mmol/L (ref 22–32)

## 2018-04-03 MED ORDER — HYDROCODONE-ACETAMINOPHEN 5-325 MG PO TABS
1.0000 | ORAL_TABLET | Freq: Once | ORAL | Status: AC
  Administered 2018-04-03: 1 via ORAL
  Filled 2018-04-03: qty 1

## 2018-04-03 MED ORDER — CLINDAMYCIN HCL 150 MG PO CAPS: 450.0000 mg | ORAL_CAPSULE | Freq: Three times a day (TID) | ORAL | 0 refills | Status: DC

## 2018-04-03 MED ORDER — HYDROCODONE-ACETAMINOPHEN 5-325 MG PO TABS: 1.0000 | ORAL_TABLET | ORAL | 0 refills | Status: DC | PRN

## 2018-04-03 NOTE — ED Notes (Addendum)
Patient verbalized understanding of pain medication side effects. Stated he is not driving home, and that his son is waiting in the car to drive patient home. Pain medication given after patient verbalized understanding.

## 2018-04-03 NOTE — ED Notes (Addendum)
Patient states he's had headache since yesterday morning. States taking 25 of 200 mg Advil tablets yesterday with no relief. Educated patient on possible organ damage related to Advil overdose, and to seek medical attention sooner to avoid taking too many Advil.

## 2018-04-03 NOTE — ED Triage Notes (Addendum)
Patient complaining of sharp pain to right side of head since yesterday. Patient states he also has pain to lower right tooth x 1 week and thinks headache is coming from tooth.

## 2018-04-03 NOTE — Discharge Instructions (Addendum)
Your labs are ok today as discussed.  I recommend avoiding advil (ibuprofen) as this is not a good medicine to take as it can elevate your blood pressure and affect your kidney function. You may take the hydrocodone prescribed for pain relief.  This will make you drowsy - do not drive within 4 hours of taking this medication. Take the antibiotic for your dental pain and infection.

## 2018-04-04 NOTE — ED Provider Notes (Signed)
[facility] EMERGENCY DEPARTMENT Provider Note   CSN: [id number] Arrival date & time: 04/03/18  1445     History   Chief Complaint Chief Complaint  Patient presents with  . Headache    HPI Charles Evans is a 62 y.o. male presenting with right lower dental pain which radiates into his right head and present since yesterday. He describes lancing pain in his right jaw.  He has known dental decay and was supposed to have dental extractions but this procedure was delayed until next week secondary to insurance reasons.  He is using dental putty, stating he reapplies when it fall away which does help with cold sensitivity but developed deep throbbing pain yesterday which has not responded to ibuprofen.  He endorses taking 800 mg strength tablets as frequently as every 2 hours, stating he took 24 tablets since yesterday morning without improvement in pain.  He denies fevers, chills, reflux, n/v, abdominal pain, vision changes or facial swelling.  The history is provided by the patient.    Past Medical History:  Diagnosis Date  . Hypertension     Patient Active Problem List   Diagnosis Date Noted  . Encounter for screening colonoscopy   . Intractable hiccups 08/15/2015  . Cough 08/15/2015  . Colon cancer screening 08/15/2015  . Dyspepsia 08/15/2015  . Chest pain 05/29/2012  . Atrial fibrillation ([place]) 05/29/2012  . HTN (hypertension) 05/29/2012  . Hyperglycemia 05/29/2012  . Hypokalemia 05/29/2012    Past Surgical History:  Procedure Laterality Date  . COLONOSCOPY N/A 08/21/2015   Procedure: COLONOSCOPY;  Surgeon: Danie Binder, MD;  [facility];  Service: Endoscopy;  Laterality: N/A;  1430  . ESOPHAGOGASTRODUODENOSCOPY N/A 08/21/2015   Procedure: ESOPHAGOGASTRODUODENOSCOPY (EGD);  Surgeon: Danie Binder, MD;  [facility];  Service: Endoscopy;  Laterality: N/A;  . HERNIA REPAIR     left groin        Home Medications    Prior to Admission  medications   Medication Sig Start Date End Date Taking? Authorizing Provider  amLODipine (NORVASC) 10 MG tablet Take 1 tablet by mouth daily. 01/30/17   [provider]  aspirin EC 81 MG tablet Take 81 mg by mouth daily.    [provider]  clindamycin (CLEOCIN) 150 MG capsule Take 3 capsules (450 mg total) by mouth 3 (three) times daily. 04/03/18   Evalee Jefferson, PA-C  cyclobenzaprine (FLEXERIL) 10 MG tablet Take 1 tablet (10 mg total) by mouth 3 (three) times daily as needed for muscle spasms. 02/22/18   Hayden Rasmussen, MD  diclofenac (VOLTAREN) 50 MG EC tablet Take 1 tablet (50 mg total) by mouth 2 (two) times daily. 11/24/17   Fransico Meadow, PA-C  HYDROcodone-acetaminophen (NORCO/VICODIN) 5-325 MG tablet Take 1 tablet by mouth every 4 (four) hours as needed. 04/03/18   Evalee Jefferson, PA-C  lisinopril-hydrochlorothiazide (PRINZIDE,ZESTORETIC) 20-25 MG tablet Take 1 tablet by mouth daily. 01/30/17   [provider]  methocarbamol (ROBAXIN) 500 MG tablet Take 1 tablet (500 mg total) by mouth 2 (two) times daily. 11/24/17   Fransico Meadow, PA-C  predniSONE (DELTASONE) 20 MG tablet Take 3 tablets (60 mg total) by mouth daily. 02/22/18   Hayden Rasmussen, MD    Family History Family History  Problem Relation Age of Onset  . Heart disease Father   . Colon cancer Cousin        less than 60    Social History Social History  Tobacco Use  . Smoking status: Current Every Day Smoker    Packs/day: 1.00    Years: 20.00    Pack years: 20.00    Types: Cigarettes  . Smokeless tobacco: Never Used  Substance Use Topics  . Alcohol use: No  . Drug use: No     Allergies   Decadron [dexamethasone] and Penicillins   Review of Systems Review of Systems  Constitutional: Negative for chills and fever.  HENT: Positive for dental problem. Negative for facial swelling and sore throat.   Respiratory: Negative for shortness of breath.   Gastrointestinal: Negative for abdominal  pain, nausea and vomiting.  Musculoskeletal: Negative.   Neurological: Positive for headaches. Negative for speech difficulty, weakness and numbness.     Physical Exam Updated Vital Signs BP (!) 145/99   Pulse 61   Temp 98.9 F (37.2 C) (Oral)   Resp 14   Ht 6\' 4"  (1.93 m)   Wt 93 kg   SpO2 98%   BMI 24.95 kg/m   Physical Exam  Constitutional: He is oriented to person, place, and time. He appears well-developed and well-nourished. No distress.  HENT:  Head: Normocephalic and atraumatic.  Right Ear: Tympanic membrane and external ear normal.  Left Ear: Tympanic membrane and external ear normal.  Mouth/Throat: Oropharynx is clear and moist and mucous membranes are normal. No oral lesions. No trismus in the jaw. Abnormal dentition. Dental caries present. No dental abscesses.    Dental putty molded around right lower 1st molar and premolar teeth. ttp at this site without gingival edema.  Sublingual space soft.   Eyes: Pupils are equal, round, and reactive to light. Conjunctivae and EOM are normal.  Neck: Normal range of motion. Neck supple.  Cardiovascular: Normal rate, regular rhythm, normal heart sounds and intact distal pulses.  Pulmonary/Chest: Effort normal and breath sounds normal. He has no wheezes.  Abdominal: Soft. Bowel sounds are normal. He exhibits no distension. There is no tenderness.  Musculoskeletal: Normal range of motion.  Lymphadenopathy:       Head (right side): Submandibular adenopathy present.    He has no cervical adenopathy.  Neurological: He is alert and oriented to person, place, and time. No cranial nerve deficit or sensory deficit.  No reproducible pain with palpation over the temporal arteries. Ambulatory, no neuro deficits. Equal grip strength, negative pronator drift.  Skin: Skin is warm and dry. No erythema.  Psychiatric: He has a normal mood and affect.  Nursing note and vitals reviewed.    ED Treatments / Results  Labs (all labs ordered  are listed, but only abnormal results are displayed) Labs Reviewed  BASIC METABOLIC PANEL - Abnormal; Notable for the following components:      Result Value   Potassium 3.1 (*)    Glucose, Bld 148 (*)    Creatinine, Ser 1.25 (*)    All other components within normal limits  I-STAT CHEM 8, ED - Abnormal; Notable for the following components:   Potassium 5.6 (*)    Creatinine, Ser 1.30 (*)    Glucose, Bld 161 (*)    Calcium, Ion 0.96 (*)    All other components within normal limits    EKG None  Radiology No results found.  Procedures Procedures (including critical care time)  Medications Ordered in ED Medications  HYDROcodone-acetaminophen (NORCO/VICODIN) 5-325 MG per tablet 1 tablet (1 tablet Oral Given 04/03/18 1530)     Initial Impression / Assessment and Plan / ED Course  I have reviewed the  triage vital signs and the nursing notes.  Pertinent labs & imaging results that were available during my care of the patient were reviewed by me and considered in my medical decision making (see chart for details).     Pt with dental infection/decay with radiating pain.  No neuro exam deficits, nontender right temple, doubt temporal arteritis.  Given high usage of nsaids,  istat run to assess renal function, elevated, but stable, K+ elevated,suspect hemolyzed sample,  repeated with bmet and normal K+.  Discussed with pt he should avoid all nsaids to protect his kidneys and his bp.  Pt understands and agrees.  He was placed on hydrocodone for pain relief, [place] database reviewed.  Clindamycin prescribed as with increased suspect apical infection, possible early abscess.  Advised recheck of bp within 1 week.  Pt agrees with and understands plan.  Final Clinical Impressions(s) / ED Diagnoses   Final diagnoses:  Pain, dental  Essential hypertension    ED Discharge Orders         Ordered    HYDROcodone-acetaminophen (NORCO/VICODIN) 5-325 MG tablet  Every 4 hours PRN     04/03/18 1640      clindamycin (CLEOCIN) 150 MG capsule  3 times daily     04/03/18 1640           Evalee Jefferson, PA-C 04/04/18 9379    Virgel Manifold, MD 04/04/18 [phone]

## 2018-04-06 DIAGNOSIS — I10 Essential (primary) hypertension: Secondary | ICD-10-CM | POA: Diagnosis not present

## 2018-04-06 DIAGNOSIS — R7301 Impaired fasting glucose: Secondary | ICD-10-CM | POA: Diagnosis not present

## 2018-04-06 DIAGNOSIS — Z0001 Encounter for general adult medical examination with abnormal findings: Secondary | ICD-10-CM | POA: Diagnosis not present

## 2018-04-06 DIAGNOSIS — M5430 Sciatica, unspecified side: Secondary | ICD-10-CM | POA: Diagnosis not present

## 2018-04-06 DIAGNOSIS — Z6824 Body mass index (BMI) 24.0-24.9, adult: Secondary | ICD-10-CM | POA: Diagnosis not present

## 2018-04-06 DIAGNOSIS — M545 Low back pain: Secondary | ICD-10-CM | POA: Diagnosis not present

## 2018-04-21 DIAGNOSIS — M545 Low back pain: Secondary | ICD-10-CM | POA: Diagnosis not present

## 2018-04-21 DIAGNOSIS — R531 Weakness: Secondary | ICD-10-CM | POA: Diagnosis not present

## 2018-04-21 DIAGNOSIS — Z6823 Body mass index (BMI) 23.0-23.9, adult: Secondary | ICD-10-CM | POA: Diagnosis not present

## 2018-05-09 DIAGNOSIS — Z6823 Body mass index (BMI) 23.0-23.9, adult: Secondary | ICD-10-CM | POA: Diagnosis not present

## 2018-05-09 DIAGNOSIS — R531 Weakness: Secondary | ICD-10-CM | POA: Diagnosis not present

## 2018-05-09 DIAGNOSIS — M25512 Pain in left shoulder: Secondary | ICD-10-CM | POA: Diagnosis not present

## 2018-05-09 DIAGNOSIS — M545 Low back pain: Secondary | ICD-10-CM | POA: Diagnosis not present

## 2018-05-11 ENCOUNTER — Other Ambulatory Visit (HOSPITAL_COMMUNITY): Payer: Self-pay | Admitting: Adult Health Nurse Practitioner

## 2018-05-11 ENCOUNTER — Ambulatory Visit (HOSPITAL_COMMUNITY)
Admission: RE | Admit: 2018-05-11 | Discharge: 2018-05-11 | Disposition: A | Discharge status: Home / Self Care | Payer: BLUE CROSS/BLUE SHIELD | Source: Ambulatory Visit | Attending: Adult Health Nurse Practitioner | Admitting: Adult Health Nurse Practitioner

## 2018-05-11 DIAGNOSIS — M19012 Primary osteoarthritis, left shoulder: Secondary | ICD-10-CM | POA: Insufficient documentation

## 2018-05-11 DIAGNOSIS — M545 Low back pain, unspecified: Secondary | ICD-10-CM

## 2018-05-11 DIAGNOSIS — M25512 Pain in left shoulder: Secondary | ICD-10-CM | POA: Diagnosis not present

## 2018-05-18 ENCOUNTER — Ambulatory Visit (HOSPITAL_COMMUNITY)
Admission: RE | Admit: 2018-05-18 | Discharge: 2018-05-18 | Disposition: A | Discharge status: Home / Self Care | Payer: BLUE CROSS/BLUE SHIELD | Source: Ambulatory Visit | Attending: Adult Health Nurse Practitioner | Admitting: Adult Health Nurse Practitioner

## 2018-05-18 DIAGNOSIS — M5136 Other intervertebral disc degeneration, lumbar region: Secondary | ICD-10-CM | POA: Insufficient documentation

## 2018-05-18 DIAGNOSIS — M545 Low back pain, unspecified: Secondary | ICD-10-CM

## 2018-05-18 DIAGNOSIS — M48061 Spinal stenosis, lumbar region without neurogenic claudication: Secondary | ICD-10-CM | POA: Diagnosis not present

## 2018-05-28 DIAGNOSIS — M5126 Other intervertebral disc displacement, lumbar region: Secondary | ICD-10-CM | POA: Diagnosis not present

## 2018-06-15 ENCOUNTER — Emergency Department (HOSPITAL_COMMUNITY)
Admission: EM | Admit: 2018-06-15 | Discharge: 2018-06-15 | Discharge status: Absent without leave | Payer: BLUE CROSS/BLUE SHIELD

## 2018-06-15 ENCOUNTER — Encounter (HOSPITAL_COMMUNITY): Payer: Self-pay | Admitting: Emergency Medicine

## 2018-06-15 ENCOUNTER — Emergency Department (HOSPITAL_COMMUNITY)
Admission: EM | Admit: 2018-06-15 | Discharge: 2018-06-15 | Disposition: A | Discharge status: Home / Self Care | Payer: BLUE CROSS/BLUE SHIELD | Attending: Emergency Medicine | Admitting: Emergency Medicine

## 2018-06-15 ENCOUNTER — Other Ambulatory Visit: Payer: Self-pay

## 2018-06-15 DIAGNOSIS — M48061 Spinal stenosis, lumbar region without neurogenic claudication: Secondary | ICD-10-CM | POA: Diagnosis not present

## 2018-06-15 DIAGNOSIS — Z79899 Other long term (current) drug therapy: Secondary | ICD-10-CM | POA: Diagnosis not present

## 2018-06-15 DIAGNOSIS — T83098A Other mechanical complication of other indwelling urethral catheter, initial encounter: Secondary | ICD-10-CM | POA: Diagnosis not present

## 2018-06-15 DIAGNOSIS — T83091A Other mechanical complication of indwelling urethral catheter, initial encounter: Secondary | ICD-10-CM | POA: Diagnosis not present

## 2018-06-15 DIAGNOSIS — F17210 Nicotine dependence, cigarettes, uncomplicated: Secondary | ICD-10-CM | POA: Insufficient documentation

## 2018-06-15 DIAGNOSIS — I4891 Unspecified atrial fibrillation: Secondary | ICD-10-CM | POA: Insufficient documentation

## 2018-06-15 DIAGNOSIS — I10 Essential (primary) hypertension: Secondary | ICD-10-CM | POA: Insufficient documentation

## 2018-06-15 DIAGNOSIS — Z7982 Long term (current) use of aspirin: Secondary | ICD-10-CM | POA: Diagnosis not present

## 2018-06-15 DIAGNOSIS — Y743 Surgical instruments, materials and general hospital and personal-use devices (including sutures) associated with adverse incidents: Secondary | ICD-10-CM | POA: Diagnosis not present

## 2018-06-15 DIAGNOSIS — M5126 Other intervertebral disc displacement, lumbar region: Secondary | ICD-10-CM | POA: Diagnosis not present

## 2018-06-15 DIAGNOSIS — T839XXA Unspecified complication of genitourinary prosthetic device, implant and graft, initial encounter: Secondary | ICD-10-CM

## 2018-06-15 MED ORDER — CYCLOBENZAPRINE HCL 10 MG PO TABS
10.0000 mg | ORAL_TABLET | Freq: Once | ORAL | Status: AC
  Administered 2018-06-15: 10 mg via ORAL
  Filled 2018-06-15: qty 1

## 2018-06-15 MED ORDER — OXYCODONE-ACETAMINOPHEN 5-325 MG PO TABS: 2.0000 | ORAL_TABLET | ORAL | 0 refills | Status: DC | PRN

## 2018-06-15 NOTE — Discharge Instructions (Addendum)
You may take the pain medicine given you until you can get your prescription filled tomorrow.  Do not drive within 4 hours of taking this medicine as it will make you drowsy.

## 2018-06-15 NOTE — ED Triage Notes (Signed)
Pt states he had back surgery this AM. Pt states he had a foley catheter placed after the surgery because he could not empty his bladder completely. Pt states the catheter has came out and he has not been able to urinate since that time.

## 2018-06-15 NOTE — ED Provider Notes (Signed)
[facility] EMERGENCY DEPARTMENT Provider Note   CSN: [id number] Arrival date & time: 06/15/18  1935     History   Chief Complaint Chief Complaint  Patient presents with  . Urinary catheter problem    HPI Charles Evans is a 62 y.o. male presenting for evaluation of a Foley catheter problem.  He underwent a lumbar surgical procedure at an outpatient center in [place] today under the care of Dr. Saintclair Halsted.  He left there with a Foley catheter in place for temporary purposes, and shortly after he arrived home it fell out, apparently the balloon had not been inflated.  He has not urinated since this occurred.  He is desirous of having a catheter replaced.  Additionally, since having to be here he is not been able to get his pain medications filled.  He was prescribed Flexeril and oxycodone and his prescriptions were dropped off at his pharmacy but he will not be able to pick these up in the morning.  He has asked for 1 of each to take now for pain relief until tomorrow.  He is not driving home, he is here with family.  He has had no fevers or chills, no other significant complaints.  The history is provided by the patient.    Past Medical History:  Diagnosis Date  . Hypertension     Patient Active Problem List   Diagnosis Date Noted  . Encounter for screening colonoscopy   . Intractable hiccups 08/15/2015  . Cough 08/15/2015  . Colon cancer screening 08/15/2015  . Dyspepsia 08/15/2015  . Chest pain 05/29/2012  . Atrial fibrillation ([place]) 05/29/2012  . HTN (hypertension) 05/29/2012  . Hyperglycemia 05/29/2012  . Hypokalemia 05/29/2012    Past Surgical History:  Procedure Laterality Date  . BACK SURGERY    . COLONOSCOPY N/A 08/21/2015   Procedure: COLONOSCOPY;  Surgeon: Danie Binder, MD;  [facility];  Service: Endoscopy;  Laterality: N/A;  1430  . ESOPHAGOGASTRODUODENOSCOPY N/A 08/21/2015   Procedure: ESOPHAGOGASTRODUODENOSCOPY (EGD);  Surgeon: Danie Binder, MD;   [facility];  Service: Endoscopy;  Laterality: N/A;  . HERNIA REPAIR     left groin        Home Medications    Prior to Admission medications   Medication Sig Start Date End Date Taking? Authorizing Provider  amLODipine (NORVASC) 10 MG tablet Take 1 tablet by mouth daily. 01/30/17   [provider]  aspirin EC 81 MG tablet Take 81 mg by mouth daily.    [provider]  clindamycin (CLEOCIN) 150 MG capsule Take 3 capsules (450 mg total) by mouth 3 (three) times daily. 04/03/18   Evalee Jefferson, PA-C  cyclobenzaprine (FLEXERIL) 10 MG tablet Take 1 tablet (10 mg total) by mouth 3 (three) times daily as needed for muscle spasms. 02/22/18   Hayden Rasmussen, MD  diclofenac (VOLTAREN) 50 MG EC tablet Take 1 tablet (50 mg total) by mouth 2 (two) times daily. 11/24/17   Fransico Meadow, PA-C  HYDROcodone-acetaminophen (NORCO/VICODIN) 5-325 MG tablet Take 1 tablet by mouth every 4 (four) hours as needed. 04/03/18   Evalee Jefferson, PA-C  lisinopril-hydrochlorothiazide (PRINZIDE,ZESTORETIC) 20-25 MG tablet Take 1 tablet by mouth daily. 01/30/17   [provider]  methocarbamol (ROBAXIN) 500 MG tablet Take 1 tablet (500 mg total) by mouth 2 (two) times daily. 11/24/17   Fransico Meadow, PA-C  oxyCODONE-acetaminophen (PERCOCET/ROXICET) 5-325 MG tablet Take 2 tablets by mouth every 4 (four) hours as needed  for severe pain. 06/15/18   Evalee Jefferson, PA-C  predniSONE (DELTASONE) 20 MG tablet Take 3 tablets (60 mg total) by mouth daily. 02/22/18   Hayden Rasmussen, MD    Family History Family History  Problem Relation Age of Onset  . Heart disease Father   . Colon cancer Cousin        less than 60    Social History Social History   Tobacco Use  . Smoking status: Current Every Day Smoker    Packs/day: 1.00    Years: 20.00    Pack years: 20.00    Types: Cigarettes  . Smokeless tobacco: Never Used  Substance Use Topics  . Alcohol use: No  . Drug use: No      Allergies   Decadron [dexamethasone] and Penicillins   Review of Systems Review of Systems  Constitutional: Negative for fever.  HENT: Negative for congestion and sore throat.   Eyes: Negative.   Respiratory: Negative for chest tightness and shortness of breath.   Cardiovascular: Negative for chest pain.  Gastrointestinal: Negative for abdominal pain and nausea.  Genitourinary: Positive for difficulty urinating.  Musculoskeletal: Negative for arthralgias, joint swelling and neck pain.  Skin: Negative.  Negative for rash and wound.  Neurological: Negative for dizziness, weakness, light-headedness, numbness and headaches.  Psychiatric/Behavioral: Negative.      Physical Exam Updated Vital Signs BP 121/78 (BP Location: Right Arm)   Pulse 61   Temp 97.9 F (36.6 C) (Oral)   Resp 18   SpO2 99%   Physical Exam  Constitutional: He is oriented to person, place, and time. He appears well-developed and well-nourished.  HENT:  Head: Normocephalic and atraumatic.  Eyes: Conjunctivae are normal.  Neck: Normal range of motion.  Cardiovascular: Normal rate and regular rhythm.  Pulmonary/Chest: Effort normal and breath sounds normal. He has no wheezes.  Abdominal: Soft. Bowel sounds are normal. There is no tenderness.  Musculoskeletal: Normal range of motion.  Neurological: He is alert and oriented to person, place, and time.  Skin: Skin is warm and dry.  Psychiatric: He has a normal mood and affect.  Nursing note and vitals reviewed.    ED Treatments / Results  Labs (all labs ordered are listed, but only abnormal results are displayed) Labs Reviewed - No data to display  EKG None  Radiology No results found.  Procedures Procedures (including critical care time)  Medications Ordered in ED Medications  cyclobenzaprine (FLEXERIL) tablet 10 mg (has no administration in time range)     Initial Impression / Assessment and Plan / ED Course  I have reviewed the  triage vital signs and the nursing notes.  Pertinent labs & imaging results that were available during my care of the patient were reviewed by me and considered in my medical decision making (see chart for details).     Foley catheter was replaced and patient had no problems with this procedure.  Initially 20 cc of urine was obtained, prior to discharge he had additional urine in the bag, there is no hematuria.  He was given a Flexeril tablet here along with an oxycodone prepack for pain relief.  He is supposed to call Dr. Windy Carina office tomorrow for further instructions regarding postop follow-up care and removal of the Foley.  PRN follow-up anticipated.  Final Clinical Impressions(s) / ED Diagnoses   Final diagnoses:  Problem with Foley catheter, initial encounter [facility])    ED Discharge Orders         Ordered  oxyCODONE-acetaminophen (PERCOCET/ROXICET) 5-325 MG tablet  Every 4 hours PRN     06/15/18 2127           Evalee Jefferson, Hershal Coria 06/15/18 2147    Francine Graven, DO 06/18/18 1845

## 2018-06-16 MED FILL — Oxycodone w/ Acetaminophen Tab 5-325 MG: ORAL | Qty: 6 | Status: AC

## 2018-06-24 ENCOUNTER — Ambulatory Visit: Payer: Self-pay | Admitting: Urology

## 2018-07-27 DIAGNOSIS — I10 Essential (primary) hypertension: Secondary | ICD-10-CM | POA: Diagnosis not present

## 2018-07-27 DIAGNOSIS — M5442 Lumbago with sciatica, left side: Secondary | ICD-10-CM | POA: Diagnosis not present

## 2018-07-31 DIAGNOSIS — M25552 Pain in left hip: Secondary | ICD-10-CM | POA: Diagnosis not present

## 2018-08-06 ENCOUNTER — Other Ambulatory Visit (HOSPITAL_COMMUNITY): Payer: Self-pay | Admitting: Neurosurgery

## 2018-08-06 DIAGNOSIS — M5126 Other intervertebral disc displacement, lumbar region: Secondary | ICD-10-CM

## 2018-08-07 ENCOUNTER — Other Ambulatory Visit (HOSPITAL_COMMUNITY): Payer: Self-pay | Admitting: Neurosurgery

## 2018-08-07 DIAGNOSIS — M5126 Other intervertebral disc displacement, lumbar region: Secondary | ICD-10-CM

## 2018-08-13 ENCOUNTER — Ambulatory Visit (HOSPITAL_COMMUNITY)
Admission: RE | Admit: 2018-08-13 | Discharge: 2018-08-13 | Disposition: A | Discharge status: Home / Self Care | Payer: BLUE CROSS/BLUE SHIELD | Source: Ambulatory Visit | Attending: Neurosurgery | Admitting: Neurosurgery

## 2018-08-13 DIAGNOSIS — M5126 Other intervertebral disc displacement, lumbar region: Secondary | ICD-10-CM | POA: Diagnosis not present

## 2018-08-13 DIAGNOSIS — S3992XA Unspecified injury of lower back, initial encounter: Secondary | ICD-10-CM | POA: Diagnosis not present

## 2018-08-13 DIAGNOSIS — M545 Low back pain: Secondary | ICD-10-CM | POA: Diagnosis not present

## 2018-08-13 LAB — POCT I-STAT CREATININE: Creatinine, Ser: 1.2 mg/dL (ref 0.61–1.24)

## 2018-08-13 MED ORDER — GADOBUTROL 1 MMOL/ML IV SOLN
7.0000 mL | Freq: Once | INTRAVENOUS | Status: AC | PRN
  Administered 2018-08-13: 7 mL via INTRAVENOUS

## 2018-08-15 ENCOUNTER — Emergency Department (HOSPITAL_COMMUNITY)
Admission: EM | Admit: 2018-08-15 | Discharge: 2018-08-15 | Disposition: A | Discharge status: Home / Self Care | Payer: BLUE CROSS/BLUE SHIELD | Attending: Emergency Medicine | Admitting: Emergency Medicine

## 2018-08-15 ENCOUNTER — Emergency Department (HOSPITAL_COMMUNITY): Payer: BLUE CROSS/BLUE SHIELD

## 2018-08-15 ENCOUNTER — Encounter (HOSPITAL_COMMUNITY): Payer: Self-pay | Admitting: Emergency Medicine

## 2018-08-15 ENCOUNTER — Other Ambulatory Visit: Payer: Self-pay

## 2018-08-15 DIAGNOSIS — F17210 Nicotine dependence, cigarettes, uncomplicated: Secondary | ICD-10-CM | POA: Diagnosis not present

## 2018-08-15 DIAGNOSIS — Z79899 Other long term (current) drug therapy: Secondary | ICD-10-CM | POA: Insufficient documentation

## 2018-08-15 DIAGNOSIS — Y939 Activity, unspecified: Secondary | ICD-10-CM | POA: Diagnosis not present

## 2018-08-15 DIAGNOSIS — Y999 Unspecified external cause status: Secondary | ICD-10-CM | POA: Insufficient documentation

## 2018-08-15 DIAGNOSIS — S46819A Strain of other muscles, fascia and tendons at shoulder and upper arm level, unspecified arm, initial encounter: Secondary | ICD-10-CM | POA: Diagnosis not present

## 2018-08-15 DIAGNOSIS — S39012A Strain of muscle, fascia and tendon of lower back, initial encounter: Secondary | ICD-10-CM | POA: Insufficient documentation

## 2018-08-15 DIAGNOSIS — Y92410 Unspecified street and highway as the place of occurrence of the external cause: Secondary | ICD-10-CM | POA: Insufficient documentation

## 2018-08-15 DIAGNOSIS — S3992XA Unspecified injury of lower back, initial encounter: Secondary | ICD-10-CM | POA: Diagnosis not present

## 2018-08-15 DIAGNOSIS — S199XXA Unspecified injury of neck, initial encounter: Secondary | ICD-10-CM | POA: Diagnosis not present

## 2018-08-15 DIAGNOSIS — I10 Essential (primary) hypertension: Secondary | ICD-10-CM | POA: Diagnosis not present

## 2018-08-15 DIAGNOSIS — Z7982 Long term (current) use of aspirin: Secondary | ICD-10-CM | POA: Insufficient documentation

## 2018-08-15 DIAGNOSIS — M542 Cervicalgia: Secondary | ICD-10-CM | POA: Diagnosis not present

## 2018-08-15 MED ORDER — TRAMADOL HCL 50 MG PO TABS
100.0000 mg | ORAL_TABLET | Freq: Once | ORAL | Status: AC
  Administered 2018-08-15: 100 mg via ORAL
  Filled 2018-08-15: qty 2

## 2018-08-15 MED ORDER — CYCLOBENZAPRINE HCL 10 MG PO TABS: 10.0000 mg | ORAL_TABLET | Freq: Three times a day (TID) | ORAL | 0 refills | Status: DC | PRN

## 2018-08-15 MED ORDER — ONDANSETRON 4 MG PO TBDP
4.0000 mg | ORAL_TABLET | Freq: Once | ORAL | Status: AC
  Administered 2018-08-15: 4 mg via ORAL
  Filled 2018-08-15: qty 1

## 2018-08-15 MED ORDER — CYCLOBENZAPRINE HCL 10 MG PO TABS
10.0000 mg | ORAL_TABLET | Freq: Once | ORAL | Status: AC
  Administered 2018-08-15: 10 mg via ORAL
  Filled 2018-08-15: qty 1

## 2018-08-15 MED ORDER — ACETAMINOPHEN 500 MG PO TABS
1000.0000 mg | ORAL_TABLET | Freq: Once | ORAL | Status: AC
  Administered 2018-08-15: 1000 mg via ORAL
  Filled 2018-08-15: qty 2
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
## 2018-08-15 NOTE — ED Notes (Signed)
To Rad 
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
## 2018-08-15 NOTE — Discharge Instructions (Addendum)
Your vital signs have been reviewed.  Your oxygen level is 94% on room air.  This is within normal limits.  The CT scan of your cervical spine is negative for fracture or dislocation.  The x-ray of your lower back shows the bony structures to be in place.  There are multiple areas of narrowing of the disc areas.  Your examination is negative for any acute neurologic deficits at this time.  Please use a heating pad to the affected areas.  Please use Flexeril for spasm pain. This medication may cause drowsiness. Please do not drink, drive, or participate in activity that requires concentration while taking this medication.  Please see Dr. Merlyn Albert for follow-up, or your surgical specialist if any changes in your condition, problems, or concerns.

## 2018-08-15 NOTE — ED Notes (Signed)
Hb in to discuss findings

## 2018-08-20 DIAGNOSIS — M5440 Lumbago with sciatica, unspecified side: Secondary | ICD-10-CM | POA: Diagnosis not present

## 2018-09-18 ENCOUNTER — Other Ambulatory Visit: Payer: Self-pay | Admitting: Neurosurgery

## 2018-09-23 NOTE — Pre-Procedure Instructions (Signed)
Charles Evans  09/23/2018      [facility], [place] - [address] [place] [address] Phone: [phone] Fax: [phone]    Your procedure is scheduled on March 9  Report to [place] at [place].M.  Call this number if you have problems the morning of surgery:  [phone]   Remember:  Do not eat or drink after midnight.    Take these medicines the morning of surgery with A SIP OF WATER  amLODipine (NORVASC  Follow your surgeon's instructions on when to stop Asprin.  If no instructions were given by your surgeon then you will need to call the office to get those instructions.    7 days prior to surgery STOP taking any Aspirin (unless otherwise instructed by your surgeon), Aleve, Naproxen, Ibuprofen, Motrin, Advil, Goody's, BC's, all herbal medications, fish oil, and all vitamins.     Do not wear jewelry  Do not wear lotions, powders, or cologne, or deodorant.  Men may shave face and neck.  Do not bring valuables to the hospital.  [facility] is not responsible for any belongings or valuables.  Contacts, dentures or bridgework may not be worn into surgery.  Leave your suitcase in the car.  After surgery it may be brought to your room.  For patients admitted to the hospital, discharge time will be determined by your treatment team.  Patients discharged the day of surgery will not be allowed to drive home.    Special instructions:   [place]- Preparing For Surgery  Before surgery, you can play an important role. Because skin is not sterile, your skin needs to be as free of germs as possible. You can reduce the number of germs on your skin by washing with CHG (chlorahexidine gluconate) Soap before surgery.  CHG is an antiseptic cleaner which kills germs and bonds with the skin to continue killing germs even after washing.    Oral Hygiene is also important to reduce your risk of infection.  Remember -  BRUSH YOUR TEETH THE MORNING OF SURGERY WITH YOUR REGULAR TOOTHPASTE  Please do not use if you have an allergy to CHG or antibacterial soaps. If your skin becomes reddened/irritated stop using the CHG.  Do not shave (including legs and underarms) for at least 48 hours prior to first CHG shower. It is OK to shave your face.  Please follow these instructions carefully.   1. Shower the NIGHT BEFORE SURGERY and the MORNING OF SURGERY with CHG.   2. If you chose to wash your hair, wash your hair first as usual with your normal shampoo.  3. After you shampoo, rinse your hair and body thoroughly to remove the shampoo.  4. Use CHG as you would any other liquid soap. You can apply CHG directly to the skin and wash gently with a scrungie or a clean washcloth.   5. Apply the CHG Soap to your body ONLY FROM THE NECK DOWN.  Do not use on open wounds or open sores. Avoid contact with your eyes, ears, mouth and genitals (private parts). Wash Face and genitals (private parts)  with your normal soap.  6. Wash thoroughly, paying special attention to the area where your surgery will be performed.  7. Thoroughly rinse your body with warm water from the neck down.  8. DO NOT shower/wash with your normal soap after using and rinsing off the CHG Soap.  9. [place]  yourself dry with a CLEAN TOWEL.  10. Wear CLEAN PAJAMAS to bed the night before surgery, wear comfortable clothes the morning of surgery  11. Place CLEAN SHEETS on your bed the night of your first shower and DO NOT SLEEP WITH PETS.    Day of Surgery:  Do not apply any deodorants/lotions.  Please wear clean clothes to the hospital/surgery center.   Remember to brush your teeth WITH YOUR REGULAR TOOTHPASTE.    Please read over the following fact sheets that you were given.

## 2018-09-24 ENCOUNTER — Other Ambulatory Visit: Payer: Self-pay

## 2018-09-24 ENCOUNTER — Encounter (HOSPITAL_COMMUNITY): Payer: Self-pay | Admitting: Emergency Medicine

## 2018-09-24 ENCOUNTER — Encounter (HOSPITAL_COMMUNITY)
Admission: RE | Admit: 2018-09-24 | Discharge: 2018-09-24 | Disposition: A | Discharge status: Home / Self Care | Payer: Medicaid Other | Source: Ambulatory Visit | Attending: Neurosurgery | Admitting: Neurosurgery

## 2018-09-24 ENCOUNTER — Emergency Department (HOSPITAL_COMMUNITY): Payer: Medicaid Other

## 2018-09-24 ENCOUNTER — Encounter (HOSPITAL_COMMUNITY): Payer: Self-pay

## 2018-09-24 ENCOUNTER — Emergency Department (HOSPITAL_COMMUNITY)
Admission: EM | Admit: 2018-09-24 | Discharge: 2018-09-24 | Disposition: A | Discharge status: Home / Self Care | Payer: Medicaid Other | Attending: Emergency Medicine | Admitting: Emergency Medicine

## 2018-09-24 DIAGNOSIS — Y9389 Activity, other specified: Secondary | ICD-10-CM | POA: Insufficient documentation

## 2018-09-24 DIAGNOSIS — F17210 Nicotine dependence, cigarettes, uncomplicated: Secondary | ICD-10-CM | POA: Insufficient documentation

## 2018-09-24 DIAGNOSIS — Z79899 Other long term (current) drug therapy: Secondary | ICD-10-CM | POA: Diagnosis not present

## 2018-09-24 DIAGNOSIS — I10 Essential (primary) hypertension: Secondary | ICD-10-CM | POA: Diagnosis not present

## 2018-09-24 DIAGNOSIS — S0990XA Unspecified injury of head, initial encounter: Secondary | ICD-10-CM

## 2018-09-24 DIAGNOSIS — Z7982 Long term (current) use of aspirin: Secondary | ICD-10-CM | POA: Diagnosis not present

## 2018-09-24 DIAGNOSIS — M545 Low back pain: Secondary | ICD-10-CM | POA: Diagnosis not present

## 2018-09-24 DIAGNOSIS — M542 Cervicalgia: Secondary | ICD-10-CM

## 2018-09-24 DIAGNOSIS — S39012A Strain of muscle, fascia and tendon of lower back, initial encounter: Secondary | ICD-10-CM | POA: Diagnosis not present

## 2018-09-24 DIAGNOSIS — Y998 Other external cause status: Secondary | ICD-10-CM | POA: Diagnosis not present

## 2018-09-24 DIAGNOSIS — Y92410 Unspecified street and highway as the place of occurrence of the external cause: Secondary | ICD-10-CM | POA: Diagnosis not present

## 2018-09-24 LAB — CBC
HCT: 48.2 % (ref 39.0–52.0)
Hemoglobin: 15.8 g/dL (ref 13.0–17.0)
MCH: 28.9 pg (ref 26.0–34.0)
MCHC: 32.8 g/dL (ref 30.0–36.0)
MCV: 88.1 fL (ref 80.0–100.0)
NRBC: 0 % (ref 0.0–0.2)
Platelets: 236 10*3/uL (ref 150–400)
RBC: 5.47 MIL/uL (ref 4.22–5.81)
RDW: 14.1 % (ref 11.5–15.5)
WBC: 7.4 10*3/uL (ref 4.0–10.5)

## 2018-09-24 LAB — BASIC METABOLIC PANEL
Anion gap: 10 (ref 5–15)
BUN: 11 mg/dL (ref 8–23)
CHLORIDE: 106 mmol/L (ref 98–111)
CO2: 22 mmol/L (ref 22–32)
Calcium: 9.1 mg/dL (ref 8.9–10.3)
Creatinine, Ser: 1.09 mg/dL (ref 0.61–1.24)
GFR calc Af Amer: >60 mL/min (ref >60)
GFR calc non Af Amer: >60 mL/min (ref >60)
Glucose, Bld: 94 mg/dL (ref 70–99)
Potassium: 3.3 mmol/L — ABNORMAL LOW (ref 3.5–5.1)
SODIUM: 138 mmol/L (ref 135–145)

## 2018-09-24 LAB — SURGICAL PCR SCREEN
MRSA, PCR: NEGATIVE
STAPHYLOCOCCUS AUREUS: NEGATIVE
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
## 2018-09-24 NOTE — ED Notes (Signed)
Have talked with wife and notified patient that he HAS to have CT scans before he is able to have surgery
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
## 2018-09-24 NOTE — ED Notes (Signed)
MD at bedside. 

## 2018-09-24 NOTE — Progress Notes (Addendum)
PCP - Dr. Edwinna Areola. Hall Cardiologist - denies  Chest x-ray - denies EKG - 09/24/2018 Stress Test - denies ECHO - denies Cardiac Cath - denies  Sleep Study - denies CPAP - N/A  Blood Thinner Instructions: N/A Aspirin Instructions: per pt. "stopped taking Aspirin more than a week ago"  Anesthesia review: Yes, EKG  Patient denies shortness of breath, fever, cough and chest pain at PAT appointment  Patient verbalized understanding of instructions that were given to them at the PAT appointment. Patient was also instructed that they will need to review over the PAT instructions again at home before surgery.
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
## 2018-09-25 NOTE — Anesthesia Preprocedure Evaluation (Addendum)
Anesthesia Evaluation  Patient identified by MRN, date of birth, ID band Patient awake    Reviewed: Allergy & Precautions, NPO status , Patient's Chart, lab work & pertinent test results  Airway Mallampati: II  TM Distance: >3 FB Neck ROM: Full    Dental  (+) Dental Advisory Given   Pulmonary Current Smoker,    breath sounds clear to auscultation       Cardiovascular hypertension, Pt. on medications  Rhythm:Regular Rate:Normal     Neuro/Psych negative neurological ROS     GI/Hepatic negative GI ROS, Neg liver ROS,   Endo/Other  negative endocrine ROS  Renal/GU negative Renal ROS     Musculoskeletal   Abdominal   Peds  Hematology negative hematology ROS (+)   Anesthesia Other Findings   Reproductive/Obstetrics                            Lab Results  Component Value Date   WBC 7.4 09/24/2018   HGB 15.8 09/24/2018   HCT 48.2 09/24/2018   MCV 88.1 09/24/2018   PLT 236 09/24/2018   Lab Results  Component Value Date   CREATININE 1.09 09/24/2018   BUN 11 09/24/2018   NA 138 09/24/2018   K 3.3 (L) 09/24/2018   CL 106 09/24/2018   CO2 22 09/24/2018    Anesthesia Physical Anesthesia Plan  ASA: II  Anesthesia Plan: General   Post-op Pain Management:    Induction: Intravenous  PONV Risk Score and Plan: 2 and Dexamethasone, Ondansetron and Treatment may vary due to age or medical condition  Airway Management Planned: Oral ETT  Additional Equipment:   Intra-op Plan:   Post-operative Plan: Extubation in OR  Informed Consent: I have reviewed the patients History and Physical, chart, labs and discussed the procedure including the risks, benefits and alternatives for the proposed anesthesia with the patient or authorized representative who has indicated his/her understanding and acceptance.     Dental advisory given  Plan Discussed with:   Anesthesia Plan Comments:        Anesthesia Quick Evaluation

## 2018-09-28 ENCOUNTER — Ambulatory Visit (HOSPITAL_COMMUNITY): Payer: Medicaid Other | Admitting: Anesthesiology

## 2018-09-28 ENCOUNTER — Encounter (HOSPITAL_COMMUNITY): Payer: Self-pay | Admitting: Surgery

## 2018-09-28 ENCOUNTER — Ambulatory Visit (HOSPITAL_COMMUNITY)
Admission: RE | Admit: 2018-09-28 | Discharge: 2018-09-28 | Disposition: A | Discharge status: Home / Self Care | Payer: Medicaid Other | Attending: Neurosurgery | Admitting: Neurosurgery

## 2018-09-28 ENCOUNTER — Ambulatory Visit (HOSPITAL_COMMUNITY): Payer: Medicaid Other

## 2018-09-28 ENCOUNTER — Encounter (HOSPITAL_COMMUNITY)
Admission: RE | Disposition: A | Discharge status: Home / Self Care | Payer: Self-pay | Source: Home / Self Care | Attending: Neurosurgery

## 2018-09-28 ENCOUNTER — Other Ambulatory Visit: Payer: Self-pay

## 2018-09-28 ENCOUNTER — Ambulatory Visit (HOSPITAL_COMMUNITY): Payer: Medicaid Other | Admitting: Physician Assistant

## 2018-09-28 DIAGNOSIS — M5116 Intervertebral disc disorders with radiculopathy, lumbar region: Secondary | ICD-10-CM | POA: Insufficient documentation

## 2018-09-28 DIAGNOSIS — F17210 Nicotine dependence, cigarettes, uncomplicated: Secondary | ICD-10-CM | POA: Diagnosis not present

## 2018-09-28 DIAGNOSIS — I10 Essential (primary) hypertension: Secondary | ICD-10-CM | POA: Insufficient documentation

## 2018-09-28 DIAGNOSIS — Z79899 Other long term (current) drug therapy: Secondary | ICD-10-CM | POA: Insufficient documentation

## 2018-09-28 DIAGNOSIS — Z7982 Long term (current) use of aspirin: Secondary | ICD-10-CM | POA: Diagnosis not present

## 2018-09-28 DIAGNOSIS — Z419 Encounter for procedure for purposes other than remedying health state, unspecified: Secondary | ICD-10-CM

## 2018-09-28 DIAGNOSIS — M5126 Other intervertebral disc displacement, lumbar region: Secondary | ICD-10-CM | POA: Diagnosis present

## 2018-09-28 HISTORY — PX: LUMBAR LAMINECTOMY/DECOMPRESSION MICRODISCECTOMY: SHX5026

## 2018-09-28 SURGERY — LUMBAR LAMINECTOMY/DECOMPRESSION MICRODISCECTOMY 1 LEVEL
Anesthesia: General | Site: Back | Laterality: Left

## 2018-09-28 MED ORDER — OXYCODONE HCL 5 MG PO TABS
10.0000 mg | ORAL_TABLET | ORAL | Status: DC | PRN
  Administered 2018-09-28 (×2): 10 mg via ORAL
  Filled 2018-09-28: qty 2

## 2018-09-28 MED ORDER — SODIUM CHLORIDE 0.9 % IV SOLN: 250.0000 mL | INTRAVENOUS | Status: DC

## 2018-09-28 MED ORDER — LIDOCAINE HCL (CARDIAC) PF 100 MG/5ML IV SOSY
PREFILLED_SYRINGE | INTRAVENOUS | Status: DC | PRN
  Administered 2018-09-28: 30 mg via INTRAVENOUS

## 2018-09-28 MED ORDER — PROPOFOL 10 MG/ML IV BOLUS
INTRAVENOUS | Status: DC | PRN
  Administered 2018-09-28: 170 mg via INTRAVENOUS

## 2018-09-28 MED ORDER — LIDOCAINE-EPINEPHRINE 1 %-1:100000 IJ SOLN
INTRAMUSCULAR | Status: AC
  Filled 2018-09-28: qty 1

## 2018-09-28 MED ORDER — VANCOMYCIN HCL 1000 MG IV SOLR
INTRAVENOUS | Status: DC | PRN
  Administered 2018-09-28: 1000 mg via INTRAVENOUS

## 2018-09-28 MED ORDER — BUPIVACAINE HCL (PF) 0.25 % IJ SOLN
INTRAMUSCULAR | Status: AC
  Filled 2018-09-28: qty 30

## 2018-09-28 MED ORDER — THROMBIN 5000 UNITS EX SOLR
CUTANEOUS | Status: DC | PRN
  Administered 2018-09-28 (×2): 5000 [IU] via TOPICAL

## 2018-09-28 MED ORDER — CYCLOBENZAPRINE HCL 10 MG PO TABS: 10.0000 mg | ORAL_TABLET | Freq: Three times a day (TID) | ORAL | Status: DC | PRN

## 2018-09-28 MED ORDER — AMLODIPINE BESYLATE 5 MG PO TABS: 10.0000 mg | ORAL_TABLET | Freq: Every day | ORAL | Status: DC

## 2018-09-28 MED ORDER — CHLORHEXIDINE GLUCONATE CLOTH 2 % EX PADS: 6.0000 | MEDICATED_PAD | Freq: Once | CUTANEOUS | Status: DC

## 2018-09-28 MED ORDER — VANCOMYCIN HCL IN DEXTROSE 1-5 GM/200ML-% IV SOLN
1000.0000 mg | INTRAVENOUS | Status: AC
  Administered 2018-09-28: 1000 mg via INTRAVENOUS

## 2018-09-28 MED ORDER — FENTANYL CITRATE (PF) 100 MCG/2ML IJ SOLN
INTRAMUSCULAR | Status: DC | PRN
  Administered 2018-09-28: 100 ug via INTRAVENOUS

## 2018-09-28 MED ORDER — PROPOFOL 10 MG/ML IV BOLUS
INTRAVENOUS | Status: AC
  Filled 2018-09-28: qty 20

## 2018-09-28 MED ORDER — LISINOPRIL 20 MG PO TABS
20.0000 mg | ORAL_TABLET | Freq: Every day | ORAL | Status: DC
  Administered 2018-09-28: 20 mg via ORAL
  Filled 2018-09-28: qty 1

## 2018-09-28 MED ORDER — ACETAMINOPHEN 10 MG/ML IV SOLN
INTRAVENOUS | Status: AC
  Filled 2018-09-28: qty 100

## 2018-09-28 MED ORDER — MENTHOL 3 MG MT LOZG: 1.0000 | LOZENGE | OROMUCOSAL | Status: DC | PRN

## 2018-09-28 MED ORDER — PHENYLEPHRINE HCL 10 MG/ML IJ SOLN
INTRAMUSCULAR | Status: DC | PRN
  Administered 2018-09-28: 80 ug via INTRAVENOUS

## 2018-09-28 MED ORDER — PANTOPRAZOLE SODIUM 40 MG IV SOLR: 40.0000 mg | Freq: Every day | INTRAVENOUS | Status: DC

## 2018-09-28 MED ORDER — SODIUM CHLORIDE 0.9 % IV SOLN
INTRAVENOUS | Status: DC | PRN
  Administered 2018-09-28: 25 ug/min via INTRAVENOUS
  Administered 2018-09-28: 50 ug/min via INTRAVENOUS

## 2018-09-28 MED ORDER — ACETAMINOPHEN 10 MG/ML IV SOLN
INTRAVENOUS | Status: DC | PRN
  Administered 2018-09-28: 1000 mg via INTRAVENOUS

## 2018-09-28 MED ORDER — ACETAMINOPHEN 650 MG RE SUPP: 650.0000 mg | RECTAL | Status: DC | PRN

## 2018-09-28 MED ORDER — CYCLOBENZAPRINE HCL 10 MG PO TABS
ORAL_TABLET | ORAL | Status: AC
  Filled 2018-09-28: qty 1

## 2018-09-28 MED ORDER — IBUPROFEN 200 MG PO TABS: 400.0000 mg | ORAL_TABLET | Freq: Four times a day (QID) | ORAL | Status: DC | PRN

## 2018-09-28 MED ORDER — ASPIRIN EC 81 MG PO TBEC
81.0000 mg | DELAYED_RELEASE_TABLET | Freq: Every day | ORAL | Status: DC
  Administered 2018-09-28: 81 mg via ORAL
  Filled 2018-09-28: qty 1

## 2018-09-28 MED ORDER — CYCLOBENZAPRINE HCL 10 MG PO TABS
10.0000 mg | ORAL_TABLET | Freq: Three times a day (TID) | ORAL | Status: DC | PRN
  Administered 2018-09-28: 10 mg via ORAL

## 2018-09-28 MED ORDER — MIDAZOLAM HCL 5 MG/5ML IJ SOLN
INTRAMUSCULAR | Status: DC | PRN
  Administered 2018-09-28: 2 mg via INTRAVENOUS

## 2018-09-28 MED ORDER — HYDROMORPHONE HCL 1 MG/ML IJ SOLN: 1.0000 mg | INTRAMUSCULAR | Status: DC | PRN

## 2018-09-28 MED ORDER — ALUM & MAG HYDROXIDE-SIMETH 200-200-20 MG/5ML PO SUSP: 30.0000 mL | Freq: Four times a day (QID) | ORAL | Status: DC | PRN

## 2018-09-28 MED ORDER — DEXAMETHASONE SODIUM PHOSPHATE 4 MG/ML IJ SOLN
INTRAMUSCULAR | Status: DC | PRN
  Administered 2018-09-28: 5 mg via INTRAVENOUS

## 2018-09-28 MED ORDER — CEFAZOLIN SODIUM-DEXTROSE 2-4 GM/100ML-% IV SOLN
2.0000 g | Freq: Three times a day (TID) | INTRAVENOUS | Status: DC
  Administered 2018-09-28: 2 g via INTRAVENOUS
  Filled 2018-09-28: qty 100

## 2018-09-28 MED ORDER — THROMBIN 5000 UNITS EX SOLR
CUTANEOUS | Status: AC
  Filled 2018-09-28: qty 5000

## 2018-09-28 MED ORDER — FENTANYL CITRATE (PF) 100 MCG/2ML IJ SOLN: 25.0000 ug | INTRAMUSCULAR | Status: DC | PRN

## 2018-09-28 MED ORDER — SODIUM CHLORIDE 0.9% FLUSH: 3.0000 mL | Freq: Two times a day (BID) | INTRAVENOUS | Status: DC

## 2018-09-28 MED ORDER — VANCOMYCIN HCL IN DEXTROSE 1-5 GM/200ML-% IV SOLN
INTRAVENOUS | Status: AC
  Administered 2018-09-28: 1000 mg via INTRAVENOUS
  Filled 2018-09-28: qty 200

## 2018-09-28 MED ORDER — ACETAMINOPHEN 325 MG PO TABS: 650.0000 mg | ORAL_TABLET | ORAL | Status: DC | PRN

## 2018-09-28 MED ORDER — 0.9 % SODIUM CHLORIDE (POUR BTL) OPTIME
TOPICAL | Status: DC | PRN
  Administered 2018-09-28: 1000 mL

## 2018-09-28 MED ORDER — ONDANSETRON HCL 4 MG PO TABS: 4.0000 mg | ORAL_TABLET | Freq: Four times a day (QID) | ORAL | Status: DC | PRN

## 2018-09-28 MED ORDER — LACTATED RINGERS IV SOLN
INTRAVENOUS | Status: DC
  Administered 2018-09-28 (×3): via INTRAVENOUS

## 2018-09-28 MED ORDER — SUGAMMADEX SODIUM 200 MG/2ML IV SOLN
INTRAVENOUS | Status: DC | PRN
  Administered 2018-09-28: 200 mg via INTRAVENOUS

## 2018-09-28 MED ORDER — ONDANSETRON HCL 4 MG/2ML IJ SOLN: 4.0000 mg | Freq: Four times a day (QID) | INTRAMUSCULAR | Status: DC | PRN

## 2018-09-28 MED ORDER — SODIUM CHLORIDE 0.9 % IV SOLN
INTRAVENOUS | Status: DC | PRN
  Administered 2018-09-28: 500 mL

## 2018-09-28 MED ORDER — MIDAZOLAM HCL 2 MG/2ML IJ SOLN
INTRAMUSCULAR | Status: AC
  Filled 2018-09-28: qty 2

## 2018-09-28 MED ORDER — FENTANYL CITRATE (PF) 250 MCG/5ML IJ SOLN
INTRAMUSCULAR | Status: AC
  Filled 2018-09-28: qty 5

## 2018-09-28 MED ORDER — PROMETHAZINE HCL 25 MG/ML IJ SOLN: 6.2500 mg | INTRAMUSCULAR | Status: DC | PRN

## 2018-09-28 MED ORDER — PHENOL 1.4 % MT LIQD: 1.0000 | OROMUCOSAL | Status: DC | PRN

## 2018-09-28 MED ORDER — HYDROCHLOROTHIAZIDE 25 MG PO TABS
25.0000 mg | ORAL_TABLET | Freq: Every day | ORAL | Status: DC
  Administered 2018-09-28: 25 mg via ORAL

## 2018-09-28 MED ORDER — OXYCODONE HCL 5 MG PO TABS
ORAL_TABLET | ORAL | Status: AC
  Filled 2018-09-28: qty 2

## 2018-09-28 MED ORDER — BUPIVACAINE HCL (PF) 0.25 % IJ SOLN
INTRAMUSCULAR | Status: DC | PRN
  Administered 2018-09-28: 9 mL

## 2018-09-28 MED ORDER — ONDANSETRON HCL 4 MG/2ML IJ SOLN
INTRAMUSCULAR | Status: DC | PRN
  Administered 2018-09-28: 4 mg via INTRAVENOUS

## 2018-09-28 MED ORDER — LISINOPRIL-HYDROCHLOROTHIAZIDE 20-25 MG PO TABS: 1.0000 | ORAL_TABLET | Freq: Every day | ORAL | Status: DC

## 2018-09-28 MED ORDER — SODIUM CHLORIDE 0.9% FLUSH: 3.0000 mL | INTRAVENOUS | Status: DC | PRN

## 2018-09-28 MED ORDER — ROCURONIUM BROMIDE 100 MG/10ML IV SOLN
INTRAVENOUS | Status: DC | PRN
  Administered 2018-09-28: 50 mg via INTRAVENOUS

## 2018-09-28 MED ORDER — LIDOCAINE-EPINEPHRINE 1 %-1:100000 IJ SOLN
INTRAMUSCULAR | Status: DC | PRN
  Administered 2018-09-28: 10 mL

## 2018-09-28 MED ORDER — HEMOSTATIC AGENTS (NO CHARGE) OPTIME
TOPICAL | Status: DC | PRN
  Administered 2018-09-28: 1

## 2018-09-28 SURGICAL SUPPLY — 57 items
ADH SKN CLS APL DERMABOND .7 (GAUZE/BANDAGES/DRESSINGS) ×1
APL SKNCLS STERI-STRIP NONHPOA (GAUZE/BANDAGES/DRESSINGS) ×1
BAG DECANTER FOR FLEXI CONT (MISCELLANEOUS) ×2 IMPLANT
BENZOIN TINCTURE PRP APPL 2/3 (GAUZE/BANDAGES/DRESSINGS) ×2 IMPLANT
BLADE CLIPPER SURG (BLADE) IMPLANT
BLADE SURG 11 STRL SS (BLADE) ×2 IMPLANT
BUR CUTTER 7.0 ROUND (BURR) ×2 IMPLANT
BUR MATCHSTICK NEURO 3.0 LAGG (BURR) ×2 IMPLANT
CANISTER SUCT 3000ML PPV (MISCELLANEOUS) ×2 IMPLANT
CARTRIDGE OIL MAESTRO DRILL (MISCELLANEOUS) ×1 IMPLANT
COVER WAND RF STERILE (DRAPES) ×1 IMPLANT
DECANTER SPIKE VIAL GLASS SM (MISCELLANEOUS) ×2 IMPLANT
DERMABOND ADVANCED (GAUZE/BANDAGES/DRESSINGS) ×1
DERMABOND ADVANCED .7 DNX12 (GAUZE/BANDAGES/DRESSINGS) ×1 IMPLANT
DIFFUSER DRILL AIR PNEUMATIC (MISCELLANEOUS) ×2 IMPLANT
DRAPE HALF SHEET 40X57 (DRAPES) ×1 IMPLANT
DRAPE LAPAROTOMY 100X72X124 (DRAPES) ×2 IMPLANT
DRAPE MICROSCOPE LEICA (MISCELLANEOUS) ×2 IMPLANT
DRAPE SURG 17X23 STRL (DRAPES) ×2 IMPLANT
DRSG OPSITE POSTOP 4X6 (GAUZE/BANDAGES/DRESSINGS) ×1 IMPLANT
DURAPREP 26ML APPLICATOR (WOUND CARE) ×2 IMPLANT
ELECT REM PT RETURN 9FT ADLT (ELECTROSURGICAL) ×2
ELECTRODE REM PT RTRN 9FT ADLT (ELECTROSURGICAL) ×1 IMPLANT
GAUZE 4X4 16PLY RFD (DISPOSABLE) IMPLANT
GAUZE SPONGE 4X4 12PLY STRL (GAUZE/BANDAGES/DRESSINGS) ×1 IMPLANT
GLOVE BIO SURGEON STRL SZ7 (GLOVE) ×1 IMPLANT
GLOVE BIO SURGEON STRL SZ8 (GLOVE) ×2 IMPLANT
GLOVE BIOGEL PI IND STRL 7.0 (GLOVE) IMPLANT
GLOVE BIOGEL PI IND STRL 7.5 (GLOVE) IMPLANT
GLOVE BIOGEL PI IND STRL 8.5 (GLOVE) IMPLANT
GLOVE BIOGEL PI INDICATOR 7.0 (GLOVE) ×5
GLOVE BIOGEL PI INDICATOR 7.5 (GLOVE) ×2
GLOVE BIOGEL PI INDICATOR 8.5 (GLOVE) ×1
GLOVE INDICATOR 8.5 STRL (GLOVE) ×2 IMPLANT
GOWN STRL REUS W/ TWL LRG LVL3 (GOWN DISPOSABLE) ×1 IMPLANT
GOWN STRL REUS W/ TWL XL LVL3 (GOWN DISPOSABLE) ×2 IMPLANT
GOWN STRL REUS W/TWL 2XL LVL3 (GOWN DISPOSABLE) IMPLANT
GOWN STRL REUS W/TWL LRG LVL3 (GOWN DISPOSABLE) ×4
GOWN STRL REUS W/TWL XL LVL3 (GOWN DISPOSABLE) ×2
KIT BASIN OR (CUSTOM PROCEDURE TRAY) ×2 IMPLANT
KIT TURNOVER KIT B (KITS) ×2 IMPLANT
NDL SPNL 22GX3.5 QUINCKE BK (NEEDLE) ×1 IMPLANT
NEEDLE HYPO 22GX1.5 SAFETY (NEEDLE) ×2 IMPLANT
NEEDLE SPNL 22GX3.5 QUINCKE BK (NEEDLE) ×2 IMPLANT
NS IRRIG 1000ML POUR BTL (IV SOLUTION) ×2 IMPLANT
OIL CARTRIDGE MAESTRO DRILL (MISCELLANEOUS) ×2
PACK LAMINECTOMY NEURO (CUSTOM PROCEDURE TRAY) ×2 IMPLANT
RUBBERBAND STERILE (MISCELLANEOUS) ×4 IMPLANT
SPONGE SURGIFOAM ABS GEL SZ50 (HEMOSTASIS) ×2 IMPLANT
STRIP CLOSURE SKIN 1/2X4 (GAUZE/BANDAGES/DRESSINGS) ×2 IMPLANT
SUT VIC AB 0 CT1 18XCR BRD8 (SUTURE) ×1 IMPLANT
SUT VIC AB 0 CT1 8-18 (SUTURE) ×2
SUT VIC AB 2-0 CT1 18 (SUTURE) ×2 IMPLANT
SUT VICRYL 4-0 PS2 18IN ABS (SUTURE) ×2 IMPLANT
TOWEL GREEN STERILE (TOWEL DISPOSABLE) ×2 IMPLANT
TOWEL GREEN STERILE FF (TOWEL DISPOSABLE) ×2 IMPLANT
WATER STERILE IRR 1000ML POUR (IV SOLUTION) ×2 IMPLANT

## 2018-09-28 NOTE — Discharge Summary (Signed)
Physician Discharge Summary  Patient ID: Charles Evans MRN: [id number] DOB/AGE: [birth year] 63 y.o. Estimated body mass index is 24.95 kg/m as calculated from the following:   Height as of this encounter: 6\' 4"  (1.93 m).   Weight as of this encounter: 93 kg.   Admit date: 09/28/2018 Discharge date: 09/28/2018  Admission Diagnoses:recurrent herniated nucleus pulposis L3-4 left  Discharge Diagnoses: same Active Problems:   HNP (herniated nucleus pulposus), lumbar   Discharged Condition: good  Hospital Course: patient admitted hospital underwent redo lumbar limiting discectomy did very well postop complete resolution of preoperative radicular pain this is mobilizing voiding and stable for discharge home  Consults: Significant Diagnostic Studies: Treatments:redo microscopic discectomy L3-4 left Discharge Exam: Blood pressure 111/70, pulse (!) 56, temperature 98 F (36.7 C), temperature source Oral, resp. rate 18, height 6\' 4"  (1.93 m), weight 93 kg, SpO2 94 %. Strength out of 5 wound clean dry and intact  Disposition: home   Allergies as of 09/28/2018      Reactions   Decadron [dexamethasone] Other (See Comments)   Severe hiccups lasting 15 days   Penicillins Swelling, Rash, Other (See Comments)   Did it involve swelling of the face/tongue/throat, SOB, or low BP? Yes Did it involve sudden or severe rash/hives, skin peeling, or any reaction on the inside of your mouth or nose? No Did you need to seek medical attention at a hospital or doctor's office? No When did it last happen?year  If all above answers are "NO", may proceed with cephalosporin use.      Medication List    TAKE these medications   amLODipine 10 MG tablet Commonly known as:  NORVASC Take 10 mg by mouth daily.   aspirin EC 81 MG tablet Take 81 mg by mouth daily.   cyclobenzaprine 10 MG tablet Commonly known as:  FLEXERIL Take 1 tablet (10 mg total) by mouth 3 (three) times daily as needed for muscle  spasms.   ibuprofen 200 MG tablet Commonly known as:  ADVIL,MOTRIN Take 400 mg by mouth every 6 (six) hours as needed.   lisinopril-hydrochlorothiazide 20-25 MG tablet Commonly known as:  PRINZIDE,ZESTORETIC Take 1 tablet by mouth daily.   oxyCODONE-acetaminophen 5-325 MG tablet Commonly known as:  PERCOCET/ROXICET Take 2 tablets by mouth every 4 (four) hours as needed for severe pain.        Signed: Sparrow [place] P 09/28/2018, 4:21 PM

## 2018-09-28 NOTE — Transfer of Care (Signed)
Immediate Anesthesia Transfer of Care Note  Patient: Charles Evans  Procedure(s) Performed: Microdiscectomy - L3-L4 - left (Left Back)  Patient Location: PACU  Anesthesia Type:General  Level of Consciousness: drowsy, patient cooperative and responds to stimulation  Airway & Oxygen Therapy: Patient Spontanous Breathing  Post-op Assessment: Report given to RN and Post -op Vital signs reviewed and stable  Post vital signs: Reviewed and stable  Last Vitals:  Vitals Value Taken Time  BP 120/86 09/28/2018  9:09 AM  Temp    Pulse 67 09/28/2018  9:10 AM  Resp 18 09/28/2018  9:10 AM  SpO2 100 % 09/28/2018  9:10 AM  Vitals shown include unvalidated device data.  Last Pain:  Vitals:   09/28/18 0652  TempSrc: Oral  PainSc:       Patients Stated Pain Goal: 2 (89/48/34 7583)  Complications: No apparent anesthesia complications

## 2018-09-28 NOTE — Anesthesia Postprocedure Evaluation (Signed)
Anesthesia Post Note  Patient: [name]  Procedure(s) Performed: Microdiscectomy - L3-L4 - left (Left Back)     Patient location during evaluation: PACU Anesthesia Type: General Level of consciousness: awake and alert Pain management: pain level controlled Vital Signs Assessment: post-procedure vital signs reviewed and stable Respiratory status: spontaneous breathing, nonlabored ventilation, respiratory function stable and patient connected to nasal cannula oxygen Cardiovascular status: blood pressure returned to baseline and stable Postop Assessment: no apparent nausea or vomiting Anesthetic complications: no    Last Vitals:  Vitals:   09/28/18 1007 09/28/18 1244  BP: 123/87 116/74  Pulse: (!) 52 (!) 59  Resp: 16 19  Temp: 36.4 C 36.6 C  SpO2: 99% 93%    Last Pain:  Vitals:   09/28/18 1315  TempSrc:   PainSc: 7                  Tiajuana Amass

## 2018-09-28 NOTE — Op Note (Signed)
Preoperative diagnosis: Recurrent herniated nucleus pulposis L3-4 left  Postoperative diagnosis: Same  Procedure: Redo lumbar laminectomy microdiscectomy L3-4 and the left with microdissection of the left L4 nerve root microscopic discectomy  Surgeon: Dominica Severin Malikiah Debarr  Asst.: Nash Shearer  Anesthesia: Gen.  EBL: Minimal  History of present illness: 63 year old some esophagus worsening back and left leg pain consistent with an L4 nerve root pattern workup revealed large recurrent disc herniation L3 for the left due to patient's failure conservative treatment imaging findings and progression of clinical syndrome I recommended redo Limited microdiscectomy at L3-4 the left I extensively went over the risks and benefits of the operation with him as well as perioperative course expectations of outcome and alternatives of surgery and he understood and agreed to proceed 4.  Operative procedure: Patient brought into the or was induced on general anesthesia positioned prone the Wilson frame his back was prepped and draped in routine sterile fashion is old incision was infiltrated with 10 mL lidocaine with epi and a midline incision was made and Bovie light cautery was used to dissect through the scar tissue and expose the residual of the laminotomy defect at L3-4 on the left. Interoperative x-ray confirmed identification appropriate level. So after identify the edges a laminotomy I drilled down little more the medial facet complex and under bit the inferior aspect of lamina of L3 and medial facet complex identified the L4 pedicle on the left under microscopic illumination I dissected the L4 nerve root off the L4 pedicle marching superiorly identify the disc space and a large disc herniations still partially contained was scar underneath the L4 nerve root. This was incised lumbar scalpel disc spaces cleaned out removing several large fragments from underneath the thecal sac and L4 nerve root. At the discectomy was  no further stenosis CL for nerve root was widely decompressed I had unroofed the foramen to provide some additional room and after adequate decompression been achieved was grossly irrigated meticulous hemostasis was maintained Gelfoam was opened up the dura and the muscle fascia pressure layers with after Vicryl skin was closed running 4 subcuticular. Dermabond benzo and Steri-Strips and sterile dressing was applied patient recovered in stable condition. At the end the case all needle counts sponge counts were correct.

## 2018-09-28 NOTE — Evaluation (Addendum)
Physical Therapy Evaluation/discharge Patient Details Name: Charles Evans MRN: [id number] DOB: 03-28-1956 Today's Date: 09/28/2018   History of Present Illness  Pt is a 63 y/o male s/p L3-4 discectomy. PMH includes HTN and back surgery.   Clinical Impression  Patient evaluated by Physical Therapy with no further acute PT needs identified. All education has been completed and the patient has no further questions. Pt requiring gross supervision for gait and stair navigation. Overall steady with mobility tasks. Reviewed back precautions (and how to maintain during ADL/car transfer/mobility tasks) and walking program to perform. Pt would benefit from outpatient PT once cleared from precautions by MD to address LLE weakness. See below for any follow-up Physical Therapy or equipment needs. PT is signing off. Thank you for this referral. If needs change, please re-consult.      Follow Up Recommendations Other (comment);Supervision for mobility/OOB(would benefit from outpatient PT once cleared by MD)    Equipment Recommendations  None recommended by PT    Recommendations for Other Services       Precautions / Restrictions Precautions Precautions: Back Precaution Booklet Issued: Yes (comment) Precaution Comments: Reviewed back precautions thoroughly with pt. Reviewed how to maintain during ADL tasks and during car transfers as well.  Restrictions Weight Bearing Restrictions: No      Mobility  Bed Mobility               General bed mobility comments: Sitting EOB upon entry. Reviewed use of log roll technique when performing bed mobility tasks.   Transfers Overall transfer level: Needs assistance Equipment used: None Transfers: Sit to/from Stand Sit to Stand: Supervision         General transfer comment: Supervision for safety.   Ambulation/Gait Ambulation/Gait assistance: Supervision Gait Distance (Feet): 400 Feet Assistive device: None Gait Pattern/deviations:  Step-through pattern;Decreased stride length Gait velocity: Decreased    General Gait Details: Slow, guarded gait, however, overall steady. No LOB noted. Reviewed generalized walking program to perform at home.   Stairs Stairs: Yes Stairs assistance: Supervision Stair Management: No rails;Step to pattern;Forwards Number of Stairs: 2 General stair comments: Cautious stair navigation. Cues for LE sequencing when performing stair navigation at home. Supervision for safety.   Wheelchair Mobility    Modified Rankin (Stroke Patients Only)       Balance Overall balance assessment: Needs assistance Sitting-balance support: No upper extremity supported;Feet supported Sitting balance-Leahy Scale: Good     Standing balance support: No upper extremity supported;During functional activity Standing balance-Leahy Scale: Good                               Pertinent Vitals/Pain Pain Assessment: Faces Faces Pain Scale: Hurts a little bit Pain Location: back Pain Descriptors / Indicators: Aching;Operative site guarding Pain Intervention(s): Limited activity within patient's tolerance;Monitored during session;Repositioned    Home Living Family/patient expects to be discharged to:: Private residence Living Arrangements: Spouse/significant other Available Help at Discharge: Family;Available 24 hours/day Type of Home: House Home Access: Stairs to enter Entrance Stairs-Rails: None Entrance Stairs-Number of Steps: 2 Home Layout: One level Home Equipment: None      Prior Function Level of Independence: Independent               Hand Dominance        Extremity/Trunk Assessment   Upper Extremity Assessment Upper Extremity Assessment: Overall WFL for tasks assessed    Lower Extremity Assessment Lower Extremity Assessment: LLE deficits/detail LLE Deficits /  Details: LLE weakness at baseline. Reports pain has resolved in LLE     Cervical / Trunk  Assessment Cervical / Trunk Assessment: Other exceptions Cervical / Trunk Exceptions: s/p lumbar surgery.   Communication   Communication: No difficulties  Cognition Arousal/Alertness: Awake/alert Behavior During Therapy: WFL for tasks assessed/performed Overall Cognitive Status: Within Functional Limits for tasks assessed                                        General Comments General comments (skin integrity, edema, etc.): Pt's wife present throughout session     Exercises     Assessment/Plan    PT Assessment Patent does not need any further PT services  PT Problem List         PT Treatment Interventions      PT Goals (Current goals can be found in the Care Plan section)  Acute Rehab PT Goals Patient Stated Goal: to go home  PT Goal Formulation: With patient Time For Goal Achievement: 09/28/18 Potential to Achieve Goals: Good    Frequency     Barriers to discharge        Co-evaluation               AM-PAC PT "6 Clicks" Mobility  Outcome Measure Help needed turning from your back to your side while in a flat bed without using bedrails?: None Help needed moving from lying on your back to sitting on the side of a flat bed without using bedrails?: None Help needed moving to and from a bed to a chair (including a wheelchair)?: None Help needed standing up from a chair using your arms (e.g., wheelchair or bedside chair)?: None Help needed to walk in hospital room?: None Help needed climbing 3-5 steps with a railing? : None 6 Click Score: 24    End of Session   Activity Tolerance: Patient tolerated treatment well Patient left: in bed;with call bell/phone within reach;Other (comment)(sitting EOB ) Nurse Communication: Mobility status PT Visit Diagnosis: Other abnormalities of gait and mobility (R26.89);Pain Pain - part of body: (back )    Time: 1720-1730 PT Time Calculation (min) (ACUTE ONLY): 10 min   Charges:   PT Evaluation $PT Eval  Low Complexity: [place], PT, DPT  Acute Rehabilitation Services  Pager: [phone] Office: [phone]   Rudean Hitt 09/28/2018, 5:40 PM

## 2018-09-28 NOTE — Anesthesia Procedure Notes (Signed)
Procedure Name: Intubation Date/Time: 09/28/2018 7:36 AM Performed by: Oletta Lamas, CRNA Pre-anesthesia Checklist: Patient identified, Emergency Drugs available, Suction available and Patient being monitored Patient Re-evaluated:Patient Re-evaluated prior to induction Oxygen Delivery Method: Circle System Utilized Preoxygenation: Pre-oxygenation with 100% oxygen Induction Type: IV induction Ventilation: Mask ventilation without difficulty and Oral airway inserted - appropriate to patient size Laryngoscope Size: Sabra Heck and 3 Grade View: Grade I Tube type: Oral Number of attempts: 1 Airway Equipment and Method: Stylet and Oral airway Placement Confirmation: ETT inserted through vocal cords under direct vision,  positive ETCO2 and breath sounds checked- equal and bilateral Secured at: 23 cm Tube secured with: Tape Dental Injury: Teeth and Oropharynx as per pre-operative assessment

## 2018-09-28 NOTE — Progress Notes (Signed)
Patient is discharged from room 3C07 at this time. Alert and in stable condition. IV site d/c'd and instructions read to patient and spouse with understanding verbalized. Left unit via wheelchair with all belongings at side. 

## 2018-09-28 NOTE — Discharge Instructions (Signed)
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
## 2018-09-29 ENCOUNTER — Encounter (HOSPITAL_COMMUNITY): Payer: Self-pay | Admitting: Neurosurgery

## 2018-10-14 DIAGNOSIS — Z981 Arthrodesis status: Secondary | ICD-10-CM | POA: Diagnosis not present

## 2018-10-14 DIAGNOSIS — M545 Low back pain: Secondary | ICD-10-CM | POA: Diagnosis not present

## 2019-01-09 ENCOUNTER — Other Ambulatory Visit: Payer: Self-pay

## 2019-01-09 ENCOUNTER — Emergency Department (HOSPITAL_COMMUNITY)
Admission: EM | Admit: 2019-01-09 | Discharge: 2019-01-09 | Disposition: A | Discharge status: Home / Self Care | Payer: Medicaid Other | Attending: Emergency Medicine | Admitting: Emergency Medicine

## 2019-01-09 ENCOUNTER — Encounter (HOSPITAL_COMMUNITY): Payer: Self-pay | Admitting: Emergency Medicine

## 2019-01-09 DIAGNOSIS — K0889 Other specified disorders of teeth and supporting structures: Secondary | ICD-10-CM | POA: Diagnosis not present

## 2019-01-09 DIAGNOSIS — F17210 Nicotine dependence, cigarettes, uncomplicated: Secondary | ICD-10-CM | POA: Diagnosis not present

## 2019-01-09 DIAGNOSIS — Z79899 Other long term (current) drug therapy: Secondary | ICD-10-CM | POA: Diagnosis not present

## 2019-01-09 DIAGNOSIS — I10 Essential (primary) hypertension: Secondary | ICD-10-CM | POA: Diagnosis not present

## 2019-01-09 DIAGNOSIS — Z7982 Long term (current) use of aspirin: Secondary | ICD-10-CM | POA: Diagnosis not present

## 2019-01-09 MED ORDER — AMOXICILLIN-POT CLAVULANATE 875-125 MG PO TABS: 1.0000 | ORAL_TABLET | Freq: Two times a day (BID) | ORAL | 0 refills | Status: DC

## 2019-01-09 MED ORDER — LIDOCAINE VISCOUS HCL 2 % MT SOLN: 15.0000 mL | OROMUCOSAL | 0 refills | Status: DC | PRN

## 2019-01-09 NOTE — ED Triage Notes (Signed)
Patient complains of dental pain that began yesterday. Denies fevers, chills, nausea, and, vomiting

## 2019-01-09 NOTE — ED Provider Notes (Signed)
[facility] EMERGENCY DEPARTMENT Provider Note   CSN: [id number] Arrival date & time: 01/09/19  1240    History   Chief Complaint Chief Complaint  Patient presents with  . Dental Pain    HPI Charles Evans is a 63 y.o. male with history of hypertension presents for evaluation of acute onset, progressively worsening right mandibular dental pain beginning yesterday.  Pain is constant, throbbing, worsens with attempts to eat or exposure to air or hot or cold foods.  Denies facial swelling, fevers, difficulty swallowing, nausea or vomiting.  He has tried taking ibuprofen, hydrocodone, and 4 tablets of his sisters amoxicillin without significant improvement.  He has a documented allergy to penicillins but he states this was a childhood allergy and he has taken penicillins since without difficulty.  He did have a tooth extracted near this area a year ago so he does have a dentist with whom he can follow-up.     The history is provided by the patient.    Past Medical History:  Diagnosis Date  . Hypertension     Patient Active Problem List   Diagnosis Date Noted  . HNP (herniated nucleus pulposus), lumbar 09/28/2018  . Encounter for screening colonoscopy   . Intractable hiccups 08/15/2015  . Cough 08/15/2015  . Colon cancer screening 08/15/2015  . Dyspepsia 08/15/2015  . Chest pain 05/29/2012  . Atrial fibrillation ([place]) 05/29/2012  . HTN (hypertension) 05/29/2012  . Hyperglycemia 05/29/2012  . Hypokalemia 05/29/2012    Past Surgical History:  Procedure Laterality Date  . BACK SURGERY    . COLONOSCOPY N/A 08/21/2015   Procedure: COLONOSCOPY;  Surgeon: Danie Binder, MD;  [facility];  Service: Endoscopy;  Laterality: N/A;  1430  . ESOPHAGOGASTRODUODENOSCOPY N/A 08/21/2015   Procedure: ESOPHAGOGASTRODUODENOSCOPY (EGD);  Surgeon: Danie Binder, MD;  [facility];  Service: Endoscopy;  Laterality: N/A;  . HERNIA REPAIR     left groin  . LUMBAR  LAMINECTOMY/DECOMPRESSION MICRODISCECTOMY Left 09/28/2018   Procedure: Microdiscectomy - L3-L4 - left;  Surgeon: Kary Kos, MD;  Location: [place];  Service: Neurosurgery;  Laterality: Left;  Microdiscectomy - L3-L4 - left        Home Medications    Prior to Admission medications   Medication Sig Start Date End Date Taking? Authorizing Provider  amLODipine (NORVASC) 10 MG tablet Take 10 mg by mouth daily.  01/30/17   [provider]  amoxicillin-clavulanate (AUGMENTIN) 875-125 MG tablet Take 1 tablet by mouth every 12 (twelve) hours. 01/09/19   Rodell Perna A, PA-C  aspirin EC 81 MG tablet Take 81 mg by mouth daily.    [provider]  cyclobenzaprine (FLEXERIL) 10 MG tablet Take 1 tablet (10 mg total) by mouth 3 (three) times daily as needed for muscle spasms. Patient not taking: Reported on 09/21/2018 08/15/18   Lily Kocher, PA-C  ibuprofen (ADVIL,MOTRIN) 200 MG tablet Take 400 mg by mouth every 6 (six) hours as needed.    [provider]  lidocaine (XYLOCAINE) 2 % solution Use as directed 15 mLs in the mouth or throat as needed for mouth pain. 01/09/19   Shreyas Piatkowski A, PA-C  lisinopril-hydrochlorothiazide (PRINZIDE,ZESTORETIC) 20-25 MG tablet Take 1 tablet by mouth daily. 01/30/17   [provider]  oxyCODONE-acetaminophen (PERCOCET/ROXICET) 5-325 MG tablet Take 2 tablets by mouth every 4 (four) hours as needed for severe pain. Patient not taking: Reported on 08/15/2018 06/15/18   Evalee Jefferson, PA-C    Family History Family History  Problem Relation Age of Onset  . Heart disease Father   . Colon cancer Cousin        less than 60    Social History Social History   Tobacco Use  . Smoking status: Current Every Day Smoker    Packs/day: 1.00    Years: 20.00    Pack years: 20.00    Types: Cigarettes  . Smokeless tobacco: Never Used  Substance Use Topics  . Alcohol use: No  . Drug use: No     Allergies   Decadron [dexamethasone] and Penicillins    Review of Systems Review of Systems  Constitutional: Negative for chills and fever.  HENT: Positive for dental problem. Negative for facial swelling and trouble swallowing.   Respiratory: Negative for shortness of breath.   Cardiovascular: Negative for chest pain.  Gastrointestinal: Negative for abdominal pain, nausea and vomiting.     Physical Exam Updated Vital Signs BP (!) 163/100 (BP Location: Right Arm)   Pulse 74   Temp 98.2 F (36.8 C) (Oral)   Ht 6\' 4"  (1.93 m)   Wt 93.9 kg   SpO2 99%   BMI 25.20 kg/m   Physical Exam Vitals signs and nursing note reviewed.  Constitutional:      General: He is not in acute distress.    Appearance: He is well-developed.     Comments: Resting comfortably in bed  HENT:     Head: Normocephalic and atraumatic.     Mouth/Throat:     Mouth: Mucous membranes are moist.      Comments: Diffuse dental decay with cracked and missing teeth.  Dentition appears stable.  He has some tenderness to percussion around a right mandibular molar but no fluctuance or drainage.  No trismus.  Tolerating oral secretions without difficulty.  Mouth opens to at least 3 finger widths.  No submental, sublingual, or subglossal swelling.  No tenderness to palpation of the anterior neck. Eyes:     General:        Right eye: No discharge.        Left eye: No discharge.     Conjunctiva/sclera: Conjunctivae normal.  Neck:     Vascular: No JVD.     Trachea: No tracheal deviation.  Cardiovascular:     Rate and Rhythm: Normal rate.  Pulmonary:     Effort: Pulmonary effort is normal.  Abdominal:     General: There is no distension.  Skin:    Findings: No erythema.  Neurological:     Mental Status: He is alert.  Psychiatric:        Behavior: Behavior normal.      ED Treatments / Results  Labs (all labs ordered are listed, but only abnormal results are displayed) Labs Reviewed - No data to display  EKG None  Radiology No results found.  Procedures  Procedures (including critical care time)  Medications Ordered in ED Medications - No data to display   Initial Impression / Assessment and Plan / ED Course  I have reviewed the triage vital signs and the nursing notes.  Pertinent labs & imaging results that were available during my care of the patient were reviewed by me and considered in my medical decision making (see chart for details).        Patient with toothache.  No gross abscess.  Initially hypertensive with improvement on reevaluation.  Remainder vital signs are stable and he is afebrile.  Airway is patent, tolerating secretions without difficulty and normal phonation.  Exam unconcerning for Ludwig's angina or spread of infection.  Will discharge with course of Augmentin (he has tolerated penicillins without side effects in the past), and viscous lidocaine.  He does have a dentist with whom he seen in the past year but we also have a dentist on call and I gave him other resources for follow-up with a dentist on outpatient basis.  Discussed strict ED return precautions. Pt verbalized understanding of and agreement with plan and is safe for discharge home at this time.   Final Clinical Impressions(s) / ED Diagnoses   Final diagnoses:  Pain, dental  Hypertension, unspecified type    ED Discharge Orders         Ordered    amoxicillin-clavulanate (AUGMENTIN) 875-125 MG tablet  Every 12 hours     01/09/19 1332    lidocaine (XYLOCAINE) 2 % solution  As needed     01/09/19 [address] [place] A, PA-C 01/09/19 1333    Milton Ferguson, MD 01/09/19 1710

## 2019-01-09 NOTE — Discharge Instructions (Signed)
Please take all of your antibiotics until finished!   You may develop abdominal discomfort or diarrhea from the antibiotic.  You may help offset this with probiotics which you can buy or get in yogurt. Do not eat  or take the probiotics until 2 hours after your antibiotic.   Apply warm compresses to jaw throughout the day. Alternate 600 mg of ibuprofen and [phone] mg of Tylenol every 3 hours as needed for pain. Do not exceed 4000 mg of Tylenol daily.  You may also use warm water salt gargles, Orajel, or other over-the-counter dental pain remedies.  Use lidocaine swish and spit as needed for pain but do not swallow this.   Followup with a dentist is very important for ongoing evaluation and management of recurrent dental pain.  I have attached resources for follow-up with a dentist to this packet.  I have also attached the information for Dr. Radford Pax who is the dentist on call.  Please call his office to set up an appointment and tell them you were referred from the emergency department.  Return to emergency department for emergent changing or worsening symptoms such as fever, worsening facial swelling, difficulty breathing or swallowing, throat tightness, or vision changes.  If your blood pressure (BP) was elevated on multiple readings during this visit above 130 for the top number or above 80 for the bottom number, please have this repeated by your primary care provider within one month. You can also check your blood pressure when you are out at a pharmacy or grocery store. Many have machines that will check your blood pressure.  If your blood pressure remains elevated, please follow-up with your PCP.

## 2019-06-09 ENCOUNTER — Ambulatory Visit (HOSPITAL_COMMUNITY): Payer: Medicaid Other | Admitting: Physical Therapy

## 2019-06-09 ENCOUNTER — Encounter (HOSPITAL_COMMUNITY): Payer: Self-pay

## 2019-06-09 ENCOUNTER — Telehealth (HOSPITAL_COMMUNITY): Payer: Self-pay | Admitting: Physical Therapy

## 2019-06-09 NOTE — Telephone Encounter (Signed)
pt's wife called to cancel due to they had death in the family

## 2019-06-15 ENCOUNTER — Encounter (HOSPITAL_COMMUNITY): Payer: Medicaid Other

## 2019-06-16 ENCOUNTER — Encounter (HOSPITAL_COMMUNITY): Payer: Medicaid Other

## 2019-06-22 ENCOUNTER — Encounter (HOSPITAL_COMMUNITY): Payer: Medicaid Other | Admitting: Physical Therapy

## 2019-06-23 ENCOUNTER — Encounter (HOSPITAL_COMMUNITY): Payer: Self-pay | Admitting: Physical Therapy

## 2019-06-23 ENCOUNTER — Ambulatory Visit (HOSPITAL_COMMUNITY): Payer: Medicaid Other | Attending: Neurosurgery | Admitting: Physical Therapy

## 2019-06-23 ENCOUNTER — Other Ambulatory Visit: Payer: Self-pay

## 2019-06-23 DIAGNOSIS — R600 Localized edema: Secondary | ICD-10-CM | POA: Diagnosis present

## 2019-06-23 DIAGNOSIS — G8929 Other chronic pain: Secondary | ICD-10-CM | POA: Diagnosis present

## 2019-06-23 DIAGNOSIS — M545 Low back pain: Secondary | ICD-10-CM | POA: Insufficient documentation
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
## 2019-06-23 NOTE — Patient Instructions (Addendum)
Backward Bend (Standing)    Arch backward to make hollow of back deeper. Hold 3____ seconds. Repeat __5__ times per set. Do ___1_ sets per session. Do __3__ sessions per day.  http://orth.exer.us/178   Copyright  VHI. All rights reserved.  Knee-to-Chest Stretch: Unilateral    With hand behind right knee, pull knee in to chest until a comfortable stretch is felt in lower back and buttocks. Keep back relaxed. Hold __20__ seconds. Repeat __3__ times per set. Do __1__ sets per session. Do __2__ sessions per day.  http://orth.exer.us/126   Copyright  VHI. All rights reserved.  Hamstring Stretch: Active    Support behind right knee. Starting with knee bent, attempt to straighten knee until a comfortable stretch is felt in back of thigh. Hold __20__ seconds. Repeat __3__ times per set. Do 1____ sets per session. Do ____ sessions per day. 2 http://orth.exer.us/158   Copyright  VHI. All rights reserved.

## 2019-06-24 ENCOUNTER — Encounter (HOSPITAL_COMMUNITY): Payer: Medicaid Other | Admitting: Physical Therapy

## 2019-06-28 ENCOUNTER — Encounter (HOSPITAL_COMMUNITY): Payer: Medicaid Other | Admitting: Physical Therapy

## 2019-06-30 ENCOUNTER — Other Ambulatory Visit: Payer: Self-pay

## 2019-06-30 ENCOUNTER — Encounter (HOSPITAL_COMMUNITY): Payer: Self-pay | Admitting: Physical Therapy

## 2019-06-30 ENCOUNTER — Ambulatory Visit (HOSPITAL_COMMUNITY): Payer: Medicaid Other | Admitting: Physical Therapy

## 2019-06-30 DIAGNOSIS — M545 Low back pain: Secondary | ICD-10-CM

## 2019-06-30 DIAGNOSIS — R600 Localized edema: Secondary | ICD-10-CM

## 2019-06-30 DIAGNOSIS — G8929 Other chronic pain: Secondary | ICD-10-CM

## 2019-06-30 NOTE — Therapy (Signed)
[place], [place], [address] Phone: [phone]   Fax:  [phone]  Physical Therapy Treatment  Patient Details  Name: Charles Evans MRN: [id number] Date of Birth: Dec 23, 1955 Referring Provider (PT): Kary Kos    Encounter Date: 06/30/2019  PT End of Session - 06/30/19 1401    Visit Number  2    Number of Visits  12    Date for PT Re-Evaluation  08/13/19    Authorization Type  medicaid    Authorization - Visit Number  2    Authorization - Number of Visits  4    PT Start Time  [id number]    PT Stop Time  1430    PT Time Calculation (min)  35 min    Activity Tolerance  Patient tolerated treatment well    Behavior During Therapy  [facility] for tasks assessed/performed       Past Medical History:  Diagnosis Date  . Hypertension     Past Surgical History:  Procedure Laterality Date  . BACK SURGERY    . COLONOSCOPY N/A 08/21/2015   Procedure: COLONOSCOPY;  Surgeon: Danie Binder, MD;  [facility];  Service: Endoscopy;  Laterality: N/A;  1430  . ESOPHAGOGASTRODUODENOSCOPY N/A 08/21/2015   Procedure: ESOPHAGOGASTRODUODENOSCOPY (EGD);  Surgeon: Danie Binder, MD;  [facility];  Service: Endoscopy;  Laterality: N/A;  . HERNIA REPAIR     left groin  . LUMBAR LAMINECTOMY/DECOMPRESSION MICRODISCECTOMY Left 09/28/2018   Procedure: Microdiscectomy - L3-L4 - left;  Surgeon: Kary Kos, MD;  Location: [facility];  Service: Neurosurgery;  Laterality: Left;  Microdiscectomy - L3-L4 - left    There were no vitals filed for this visit.  Subjective Assessment - 06/30/19 1359    Subjective  Patient says "its kind of rough getting started" this morning. Patient says back pain seems to improve about 1 hour after waking. Patient says he has been compliant with home exercise with no issues.    Pertinent History  A fib, HTN , previous back surgery    Limitations  Sitting;Reading;Lifting;Standing;Walking;House hold  activities    How long can you sit comfortably?  30-40 minuts    How long can you stand comfortably?  15 minutes    How long can you walk comfortably?  Pt has not walk any more but in his home.    Patient Stated Goals  less pain,    Currently in Pain?  Yes    Pain Score  4     Pain Location  Back    Pain Orientation  Posterior;Lower    Pain Descriptors / Indicators  Stabbing    Pain Type  Chronic pain    Pain Onset  More than a month ago                       [facility] Adult PT Treatment/Exercise - 06/30/19 0001      Lumbar Exercises: Stretches   Active Hamstring Stretch  3 reps;20 seconds    Single Knee to Chest Stretch  Right;Left;3 reps;20 seconds    Lower Trunk Rotation  --      Manual Therapy   Manual Therapy  Soft tissue mobilization    Manual therapy comments  All manual therapy performed separate form all other activity     Soft tissue mobilization  IASTM to bilateral lumbar paraspinals, patient in prone position  PT Short Term Goals - 06/23/19 1229      PT SHORT TERM GOAL #1   Title  Pt to be I in bed mobility, and proper body mechanics for sit to stand and lifting to allow pt in level to be no greater than a 5/10    Time  3    Period  Weeks    Status  New    Target Date  07/21/19      PT SHORT TERM GOAL #2   Title  Pt to be able to stand for 15 mintues without increased pain to be able to go to commuinty events.    Time  3    Period  Weeks        PT Long Term Goals - 06/23/19 1231      PT LONG TERM GOAL #1   Title  PT pain to be no greater than a 2/10 to allow pt to be able to walk for 45 minutes to be able to go shopping    Time  6    Period  Weeks    Status  New    Target Date  08/11/19      PT LONG TERM GOAL #2   Title  PT to be I in advanced HEP to increase core and LE strength to 4+/5 to allow pt to go up and down 10 steps in a reciprocal manner with ease.    Time  6    Period  Weeks    Status  New      PT  LONG TERM GOAL #3   Title  Pt hamstring length to improve at lseast 15 degrees bilaterally to take stress off of low back to keep pain at a lower level    Time  6    Period  Weeks    Status  New            Plan - 06/30/19 1425    Clinical Impression Statement  Patient tolerated treatment well. Treatment initiated this session. Reviewed patient goals and HEP. Added manual treatment to address lumbar pain and muscle restriction. Patient did note decreased lumbar pain post manual treatment and increased ease of performing table stretching. Patient educated on hold times with stretching and not to push into, or hold stretching into painful threshhold.    Personal Factors and Comorbidities  Comorbidity 1    Examination-Activity Limitations  Bed Mobility;Carry;Lift;Locomotion Level;Sit;Stairs;Squat;Stand    Examination-Participation Restrictions  Cleaning;Community Activity;Laundry;Yard Work    Stability/Clinical Decision Making  Stable/Uncomplicated    Rehab Potential  Good    PT Frequency  2x / week    PT Duration  6 weeks    PT Treatment/Interventions  ADLs/Self Care Home Management;Patient/family education;Manual techniques;Therapeutic exercise;Therapeutic activities;Functional mobility training;Gait training    PT Next Visit Plan  Continue manual to address lumbar pain, progress core strength as tolerated. Add LTR next visit    PT Home Exercise Plan  standing extension, knee to chest and active hamstring stretch.       Patient will benefit from skilled therapeutic intervention in order to improve the following deficits and impairments:  Abnormal gait, Decreased activity tolerance, Difficulty walking, Decreased strength, Decreased range of motion, Postural dysfunction, Pain, Increased fascial restricitons  Visit Diagnosis: Chronic bilateral low back pain without sciatica  Localized edema     Problem List Patient Active Problem List   Diagnosis Date Noted  . HNP (herniated  nucleus pulposus), lumbar 09/28/2018  . Encounter for screening  colonoscopy   . Intractable hiccups 08/15/2015  . Cough 08/15/2015  . Colon cancer screening 08/15/2015  . Dyspepsia 08/15/2015  . Chest pain 05/29/2012  . Atrial fibrillation ([place]) 05/29/2012  . HTN (hypertension) 05/29/2012  . Hyperglycemia 05/29/2012  . Hypokalemia 05/29/2012    2:31 PM, 06/30/19 Josue Hector PT DPT  Physical Therapist with [facility]  [phone] [place] [address] [place], [place], [address] Phone: [phone]   Fax:  [phone]  Name: Charles Evans MRN: [id number] Date of Birth: February 27, 1956

## 2019-07-02 ENCOUNTER — Telehealth (HOSPITAL_COMMUNITY): Payer: Self-pay | Admitting: Physical Therapy

## 2019-07-02 ENCOUNTER — Ambulatory Visit (HOSPITAL_COMMUNITY): Payer: Medicaid Other | Admitting: Physical Therapy

## 2019-07-02 NOTE — Telephone Encounter (Signed)
First no show; unable to contact pt as the phone number is in the chart is not his.  Rayetta Humphrey, [place] CLT [phone]

## 2019-07-02 NOTE — Telephone Encounter (Signed)
Duplicate disregard  Charles Evans, [place] CLT [phone]

## 2019-07-05 ENCOUNTER — Ambulatory Visit (HOSPITAL_COMMUNITY): Payer: Medicaid Other | Admitting: Physical Therapy

## 2019-07-05 ENCOUNTER — Other Ambulatory Visit: Payer: Self-pay

## 2019-07-05 ENCOUNTER — Encounter (HOSPITAL_COMMUNITY): Payer: Self-pay | Admitting: Physical Therapy

## 2019-07-05 DIAGNOSIS — R600 Localized edema: Secondary | ICD-10-CM

## 2019-07-05 DIAGNOSIS — M545 Low back pain: Secondary | ICD-10-CM | POA: Diagnosis not present

## 2019-07-05 DIAGNOSIS — G8929 Other chronic pain: Secondary | ICD-10-CM

## 2019-07-05 NOTE — Patient Instructions (Signed)
Access Code: [email or web address]  URL: [email or web address]  Date: 07/05/2019  Prepared by: Josue Hector   Exercises Supine Lower Trunk Rotation - 5 reps - 1 sets - 10 hold - 2x daily - 7x weekly

## 2019-07-05 NOTE — Therapy (Signed)
[place], [place], [address] Phone: [phone]   Fax:  [phone]  Physical Therapy Treatment  Patient Details  Name: Charles Evans MRN: [id number] Date of Birth: 1955-07-23 Referring Provider (PT): Kary Kos    Encounter Date: 07/05/2019  PT End of Session - 07/05/19 1305    Visit Number  3    Number of Visits  12    Date for PT Re-Evaluation  08/13/19    Authorization Type  medicaid    Authorization Time Period  current auth. through 12/22    Authorization - Visit Number  3    Authorization - Number of Visits  4    PT Start Time  1300    PT Stop Time  1345    PT Time Calculation (min)  45 min    Activity Tolerance  Patient tolerated treatment well    Behavior During Therapy  [facility] for tasks assessed/performed       Past Medical History:  Diagnosis Date  . Hypertension     Past Surgical History:  Procedure Laterality Date  . BACK SURGERY    . COLONOSCOPY N/A 08/21/2015   Procedure: COLONOSCOPY;  Surgeon: Danie Binder, MD;  [facility];  Service: Endoscopy;  Laterality: N/A;  1430  . ESOPHAGOGASTRODUODENOSCOPY N/A 08/21/2015   Procedure: ESOPHAGOGASTRODUODENOSCOPY (EGD);  Surgeon: Danie Binder, MD;  [facility];  Service: Endoscopy;  Laterality: N/A;  . HERNIA REPAIR     left groin  . LUMBAR LAMINECTOMY/DECOMPRESSION MICRODISCECTOMY Left 09/28/2018   Procedure: Microdiscectomy - L3-L4 - left;  Surgeon: Kary Kos, MD;  Location: [facility];  Service: Neurosurgery;  Laterality: Left;  Microdiscectomy - L3-L4 - left    There were no vitals filed for this visit.  Subjective Assessment - 07/05/19 1304    Subjective  Patient says his back is feeling a little better today. Patient says he took ibuprofen this morning and is doing fine currently. Patient says he noted decreased back pain after last visit.    Pertinent History  A fib, HTN , previous back surgery    Limitations   Sitting;Reading;Lifting;Standing;Walking;House hold activities    How long can you sit comfortably?  30-40 minuts    How long can you stand comfortably?  15 minutes    How long can you walk comfortably?  Pt has not walk any more but in his home.    Patient Stated Goals  less pain,    Currently in Pain?  Yes    Pain Score  4     Pain Location  Back    Pain Orientation  Posterior;Lower    Pain Descriptors / Indicators  Aching    Pain Onset  More than a month ago                       [facility] Adult PT Treatment/Exercise - 07/05/19 0001      Lumbar Exercises: Stretches   Active Hamstring Stretch  Right;Left;5 reps;10 seconds    Single Knee to Chest Stretch  Right;Left;3 reps;30 seconds    Lower Trunk Rotation  5 reps;10 seconds      Lumbar Exercises: Supine   Ab Set  10 reps;5 seconds      Manual Therapy   Manual Therapy  Soft tissue mobilization    Manual therapy comments  All manual therapy performed separate form all other activity     Soft tissue mobilization  IASTM to bilateral lumbar paraspinals, patient in prone position             PT Education - 07/05/19 1341    Education Details  Patient educated on exercise and technique. Patient educated on and issued updated HEP handout.    Person(s) Educated  Patient    Methods  Explanation;Handout    Comprehension  Verbalized understanding       PT Short Term Goals - 06/23/19 1229      PT SHORT TERM GOAL #1   Title  Pt to be I in bed mobility, and proper body mechanics for sit to stand and lifting to allow pt in level to be no greater than a 5/10    Time  3    Period  Weeks    Status  New    Target Date  07/21/19      PT SHORT TERM GOAL #2   Title  Pt to be able to stand for 15 mintues without increased pain to be able to go to commuinty events.    Time  3    Period  Weeks        PT Long Term Goals - 06/23/19 1231      PT LONG TERM GOAL #1   Title  PT pain to be no greater than a 2/10 to allow  pt to be able to walk for 45 minutes to be able to go shopping    Time  6    Period  Weeks    Status  New    Target Date  08/11/19      PT LONG TERM GOAL #2   Title  PT to be I in advanced HEP to increase core and LE strength to 4+/5 to allow pt to go up and down 10 steps in a reciprocal manner with ease.    Time  6    Period  Weeks    Status  New      PT LONG TERM GOAL #3   Title  Pt hamstring length to improve at lseast 15 degrees bilaterally to take stress off of low back to keep pain at a lower level    Time  6    Period  Weeks    Status  New            Plan - 07/05/19 1339    Clinical Impression Statement  Initiated treatment with manual IASTM to reduce pain and address muscle restriction in lumbar paraspinals. Patient did note decreased pain and improved ease of performing HEP stretching afterward., Patient educated on proper form and function of added LTR stretching. Patient encouraged to perform stretch, gently, though a comfortable and pain free ROM. Initiated core strengthening today, with ab set for deep core activation. Patient required verbal cueing for target muscle activation and appropriate hold times. Patient performed all ther ex in a slow, controlled manner.    Personal Factors and Comorbidities  Comorbidity 1    Examination-Activity Limitations  Bed Mobility;Carry;Lift;Locomotion Level;Sit;Stairs;Squat;Stand    Examination-Participation Restrictions  Cleaning;Community Activity;Laundry;Yard Work    Stability/Clinical Decision Making  Stable/Uncomplicated    Rehab Potential  Good    PT Frequency  2x / week    PT Duration  6 weeks    PT Treatment/Interventions  ADLs/Self Care Home Management;Patient/family education;Manual techniques;Therapeutic exercise;Therapeutic activities;Functional mobility training;Gait training    PT Next Visit Plan  Continue to progress core strengthening as tolerated. Reassess for medicaid auth next visit.  PT Home Exercise Plan   standing extension, knee to chest and active hamstring stretch. 07/05/19: LTR       Patient will benefit from skilled therapeutic intervention in order to improve the following deficits and impairments:  Abnormal gait, Decreased activity tolerance, Difficulty walking, Decreased strength, Decreased range of motion, Postural dysfunction, Pain, Increased fascial restricitons  Visit Diagnosis: Chronic bilateral low back pain without sciatica  Localized edema     Problem List Patient Active Problem List   Diagnosis Date Noted  . HNP (herniated nucleus pulposus), lumbar 09/28/2018  . Encounter for screening colonoscopy   . Intractable hiccups 08/15/2015  . Cough 08/15/2015  . Colon cancer screening 08/15/2015  . Dyspepsia 08/15/2015  . Chest pain 05/29/2012  . Atrial fibrillation ([place]) 05/29/2012  . HTN (hypertension) 05/29/2012  . Hyperglycemia 05/29/2012  . Hypokalemia 05/29/2012   1:47 PM, 07/05/19 Josue Hector PT DPT  Physical Therapist with [facility]  [phone] [place] [address] [place], [place], [address] Phone: [phone]   Fax:  [phone]  Name: Charles Evans MRN: [id number] Date of Birth: Jul 10, 1956

## 2019-07-07 ENCOUNTER — Ambulatory Visit (HOSPITAL_COMMUNITY): Payer: Medicaid Other | Admitting: Physical Therapy

## 2019-07-07 ENCOUNTER — Telehealth (HOSPITAL_COMMUNITY): Payer: Self-pay | Admitting: Physical Therapy

## 2019-07-07 NOTE — Telephone Encounter (Signed)
Called patient about missed appointment this morning. Patient said he forgot. Informed patient of next scheduled appointment time on 12/21.  4:55 PM, 07/07/19 Josue Hector PT DPT  Physical Therapist with [facility]  [phone]

## 2019-07-12 ENCOUNTER — Ambulatory Visit (HOSPITAL_COMMUNITY): Payer: Medicaid Other | Admitting: Physical Therapy

## 2019-07-12 ENCOUNTER — Telehealth (HOSPITAL_COMMUNITY): Payer: Self-pay | Admitting: Physical Therapy

## 2019-07-12 NOTE — Telephone Encounter (Signed)
Therapist called pt's home.  Contacted pt's wife. Pt wife stated that pt had some teeth extracted and he is having a difficult time with them.  Pt asked wife to call, however she forgot.   PT only authorized until 12/22.  If pt returns will need a quick reassess and put in to [facility] for more visits.  Pt has not been consistent in coming to therapy therefore will wait to see if pt is actually going to come back.  Rayetta Humphrey, [place] CLT [phone]

## 2019-07-14 ENCOUNTER — Telehealth (HOSPITAL_COMMUNITY): Payer: Self-pay | Admitting: Physical Therapy

## 2019-07-14 ENCOUNTER — Ambulatory Visit (HOSPITAL_COMMUNITY): Payer: Medicaid Other | Admitting: Physical Therapy

## 2019-07-14 NOTE — Telephone Encounter (Signed)
Called patient about today's missed appointment. Pateint says he though it was scheduled on Monday and that he was not feeling well. Patient instructed to call and reschedule when he is unable to keep scheduled appointment. Informed patient that he is currently out of authorization and will need to attend next visit on 12/29 for reassess to submit for further authorization if he wishes to continue with therapy.    6:45 PM, 07/14/19 Charles Evans PT DPT  Physical Therapist with [facility]  [phone]

## 2019-07-20 ENCOUNTER — Telehealth (HOSPITAL_COMMUNITY): Payer: Self-pay | Admitting: Physical Therapy

## 2019-07-20 ENCOUNTER — Ambulatory Visit (HOSPITAL_COMMUNITY): Payer: Medicaid Other | Admitting: Physical Therapy

## 2019-07-20 NOTE — Telephone Encounter (Signed)
Called patient about missed appointment today at 10:30am. Left voice message letting patient know that today is 3rd no show visit and per policy will be DC today. Patient instructed to follow up with referring MD if he would like to return.  11:46 AM, 07/20/19 Josue Hector PT DPT  Physical Therapist with [facility]  [phone]

## 2019-07-21 ENCOUNTER — Ambulatory Visit (HOSPITAL_COMMUNITY): Payer: Medicaid Other | Admitting: Physical Therapy

## 2019-07-22 ENCOUNTER — Encounter (HOSPITAL_COMMUNITY): Payer: Medicaid Other

## 2019-07-26 ENCOUNTER — Encounter (HOSPITAL_COMMUNITY): Payer: Medicaid Other | Admitting: Physical Therapy

## 2019-07-28 ENCOUNTER — Other Ambulatory Visit: Payer: Self-pay

## 2019-07-28 ENCOUNTER — Encounter (HOSPITAL_COMMUNITY): Payer: Medicaid Other | Admitting: Physical Therapy

## 2019-08-03 ENCOUNTER — Encounter (HOSPITAL_COMMUNITY): Payer: Medicaid Other

## 2019-08-03 ENCOUNTER — Other Ambulatory Visit: Payer: Self-pay

## 2019-08-03 ENCOUNTER — Ambulatory Visit: Payer: Medicaid Other | Attending: Internal Medicine

## 2019-08-03 DIAGNOSIS — Z20822 Contact with and (suspected) exposure to covid-19: Secondary | ICD-10-CM

## 2019-08-05 ENCOUNTER — Encounter (HOSPITAL_COMMUNITY): Payer: Medicaid Other

## 2019-08-05 LAB — NOVEL CORONAVIRUS, NAA: SARS-CoV-2, NAA: DETECTED — AB

## 2019-08-06 ENCOUNTER — Other Ambulatory Visit: Payer: Self-pay | Admitting: Nurse Practitioner

## 2019-08-06 DIAGNOSIS — I10 Essential (primary) hypertension: Secondary | ICD-10-CM

## 2019-08-06 DIAGNOSIS — U071 COVID-19: Secondary | ICD-10-CM

## 2019-08-06 NOTE — Progress Notes (Signed)
  I connected by phone with Charles Evans on 08/06/2019 at 8:23 AM to discuss the potential use of an new treatment for mild to moderate COVID-19 viral infection in non-hospitalized patients.  This patient is a 64 y.o. male that meets the FDA criteria for Emergency Use Authorization of bamlanivimab or casirivimab\imdevimab.  Has a (+) direct SARS-CoV-2 viral test result  Has mild or moderate COVID-19   Is ? 63 years of age and weighs ? 40 kg  Is NOT hospitalized due to COVID-19  Is NOT requiring oxygen therapy or requiring an increase in baseline oxygen flow rate due to COVID-19  Is within 10 days of symptom onset  Has at least one of the high risk factor(s) for progression to severe COVID-19 and/or hospitalization as defined in EUA.  Specific high risk criteria : Hypertension   I have spoken and communicated the following to the patient or parent/caregiver:  1. FDA has authorized the emergency use of bamlanivimab and casirivimab\imdevimab for the treatment of mild to moderate COVID-19 in adults and pediatric patients with positive results of direct SARS-CoV-2 viral testing who are 31 years of age and older weighing at least 40 kg, and who are at high risk for progressing to severe COVID-19 and/or hospitalization.  2. The significant known and potential risks and benefits of bamlanivimab and casirivimab\imdevimab, and the extent to which such potential risks and benefits are unknown.  3. Information on available alternative treatments and the risks and benefits of those alternatives, including clinical trials.  4. Patients treated with bamlanivimab and casirivimab\imdevimab should continue to self-isolate and use infection control measures (e.g., wear mask, isolate, social distance, avoid sharing personal items, clean and disinfect "high touch" surfaces, and frequent handwashing) according to CDC guidelines.   5. The patient or parent/caregiver has the option to accept or refuse  bamlanivimab or casirivimab\imdevimab .  After reviewing this information with the patient, The patient agreed to proceed with receiving the bamlanimivab infusion and will be provided a copy of the Fact sheet prior to receiving the infusion.[name] 08/06/2019 8:23 AM

## 2019-08-09 ENCOUNTER — Ambulatory Visit (HOSPITAL_COMMUNITY)
Admission: RE | Admit: 2019-08-09 | Discharge: 2019-08-09 | Disposition: A | Discharge status: Home / Self Care | Payer: Medicaid Other | Source: Ambulatory Visit | Attending: Pulmonary Disease | Admitting: Pulmonary Disease

## 2019-08-09 DIAGNOSIS — I10 Essential (primary) hypertension: Secondary | ICD-10-CM | POA: Diagnosis present

## 2019-08-09 DIAGNOSIS — Z23 Encounter for immunization: Secondary | ICD-10-CM | POA: Diagnosis not present

## 2019-08-09 DIAGNOSIS — U071 COVID-19: Secondary | ICD-10-CM | POA: Diagnosis present

## 2019-08-09 MED ORDER — ALBUTEROL SULFATE HFA 108 (90 BASE) MCG/ACT IN AERS: 2.0000 | INHALATION_SPRAY | Freq: Once | RESPIRATORY_TRACT | Status: DC | PRN

## 2019-08-09 MED ORDER — EPINEPHRINE 0.3 MG/0.3ML IJ SOAJ: 0.3000 mg | Freq: Once | INTRAMUSCULAR | Status: DC | PRN

## 2019-08-09 MED ORDER — SODIUM CHLORIDE 0.9 % IV SOLN: INTRAVENOUS | Status: DC | PRN

## 2019-08-09 MED ORDER — FAMOTIDINE IN NACL 20-0.9 MG/50ML-% IV SOLN: 20.0000 mg | Freq: Once | INTRAVENOUS | Status: DC | PRN

## 2019-08-09 MED ORDER — SODIUM CHLORIDE 0.9 % IV SOLN
700.0000 mg | Freq: Once | INTRAVENOUS | Status: AC
  Administered 2019-08-09: 700 mg via INTRAVENOUS
  Filled 2019-08-09: qty 20

## 2019-08-09 MED ORDER — METHYLPREDNISOLONE SODIUM SUCC 125 MG IJ SOLR: 125.0000 mg | Freq: Once | INTRAMUSCULAR | Status: DC | PRN

## 2019-08-09 MED ORDER — DIPHENHYDRAMINE HCL 50 MG/ML IJ SOLN: 50.0000 mg | Freq: Once | INTRAMUSCULAR | Status: DC | PRN

## 2019-08-09 NOTE — Discharge Instructions (Signed)
10 Things You Can Do to Manage Your COVID-19 Symptoms at Home If you have possible or confirmed COVID-19: 1. Stay home from work and school. And stay away from other public places. If you must go out, avoid using any kind of public transportation, ridesharing, or taxis. 2. Monitor your symptoms carefully. If your symptoms get worse, call your healthcare provider immediately. 3. Get rest and stay hydrated. 4. If you have a medical appointment, call the healthcare provider ahead of time and tell them that you have or may have COVID-19. 5. For medical emergencies, call 911 and notify the dispatch personnel that you have or may have COVID-19. 6. Cover your cough and sneezes with a tissue or use the inside of your elbow. 7. Wash your hands often with soap and water for at least 20 seconds or clean your hands with an alcohol-based hand sanitizer that contains at least 60% alcohol. 8. As much as possible, stay in a specific room and away from other people in your home. Also, you should use a separate bathroom, if available. If you need to be around other people in or outside of the home, wear a mask. 9. Avoid sharing personal items with other people in your household, like dishes, towels, and bedding. 10. Clean all surfaces that are touched often, like counters, tabletops, and doorknobs. Use household cleaning sprays or wipes according to the label instructions. [email or web address] 01/20/2019 This information is not intended to replace advice given to you by your health care provider. Make sure you discuss any questions you have with your health care provider. Document Revised: 06/24/2019 Document Reviewed: 06/24/2019 Elsevier Patient Education  [place]. What types of side effects do monoclonal antibody drugs cause?  Common side effects  In general, the more common side effects caused by monoclonal antibody drugs include: . Allergic reactions, such as hives or itching . Flu-like signs and  symptoms, including chills, fatigue, fever, and muscle aches and pains . Nausea, vomiting . Diarrhea . Skin rashes . Low blood pressure   The CDC is recommending patients who receive monoclonal antibody treatments wait at least 90 days before being vaccinated.  Currently, there are no data on the safety and efficacy of mRNA COVID-19 vaccines in persons who received monoclonal antibodies or convalescent plasma as part of COVID-19 treatment. Based on the estimated half-life of such therapies as well as evidence suggesting that reinfection is uncommon in the 90 days after initial infection, vaccination should be deferred for at least 90 days, as a precautionary measure until additional information becomes available, to avoid interference of the antibody treatment with vaccine-induced immune responses. What types of side effects do monoclonal antibody drugs cause?  Common side effects  In general, the more common side effects caused by monoclonal antibody drugs include: . Allergic reactions, such as hives or itching . Flu-like signs and symptoms, including chills, fatigue, fever, and muscle aches and pains . Nausea, vomiting . Diarrhea . Skin rashes . Low blood pressure   The CDC is recommending patients who receive monoclonal antibody treatments wait at least 90 days before being vaccinated.  Currently, there are no data on the safety and efficacy of mRNA COVID-19 vaccines in persons who received monoclonal antibodies or convalescent plasma as part of COVID-19 treatment. Based on the estimated half-life of such therapies as well as evidence suggesting that reinfection is uncommon in the 90 days after initial infection, vaccination should be deferred for at least 90 days, as a precautionary measure until additional information  becomes available, to avoid interference of the antibody treatment with vaccine-induced immune responses.  COVID-19 COVID-19 is a respiratory infection that is caused by  a virus called severe acute respiratory syndrome coronavirus 2 (SARS-CoV-2). The disease is also known as coronavirus disease or novel coronavirus. In some people, the virus may not cause any symptoms. In others, it may cause a serious infection. The infection can get worse quickly and can lead to complications, such as:  Pneumonia, or infection of the lungs.  Acute respiratory distress syndrome or ARDS. This is a condition in which fluid build-up in the lungs prevents the lungs from filling with air and passing oxygen into the blood.  Acute respiratory failure. This is a condition in which there is not enough oxygen passing from the lungs to the body or when carbon dioxide is not passing from the lungs out of the body.  Sepsis or septic shock. This is a serious bodily reaction to an infection.  Blood clotting problems.  Secondary infections due to bacteria or fungus.  Organ failure. This is when your body's organs stop working. The virus that causes COVID-19 is contagious. This means that it can spread from person to person through droplets from coughs and sneezes (respiratory secretions). What are the causes? This illness is caused by a virus. You may catch the virus by:  Breathing in droplets from an infected person. Droplets can be spread by a person breathing, speaking, singing, coughing, or sneezing.  Touching something, like a table or a doorknob, that was exposed to the virus (contaminated) and then touching your mouth, nose, or eyes. What increases the risk? Risk for infection You are more likely to be infected with this virus if you:  Are within 6 feet (2 meters) of a person with COVID-19.  Provide care for or live with a person who is infected with COVID-19.  Spend time in crowded indoor spaces or live in shared housing. Risk for serious illness You are more likely to become seriously ill from the virus if you:  Are 48 years of age or older. The higher your age, the more  you are at risk for serious illness.  Live in a nursing home or long-term care facility.  Have cancer.  Have a long-term (chronic) disease such as: ? Chronic lung disease, including chronic obstructive pulmonary disease or asthma. ? A long-term disease that lowers your body's ability to fight infection (immunocompromised). ? Heart disease, including heart failure, a condition in which the arteries that lead to the heart become narrow or blocked (coronary artery disease), a disease which makes the heart muscle thick, weak, or stiff (cardiomyopathy). ? Diabetes. ? Chronic kidney disease. ? Sickle cell disease, a condition in which red blood cells have an abnormal "sickle" shape. ? Liver disease.  Are obese. What are the signs or symptoms? Symptoms of this condition can range from mild to severe. Symptoms may appear any time from 2 to 14 days after being exposed to the virus. They include:  A fever or chills.  A cough.  Difficulty breathing.  Headaches, body aches, or muscle aches.  Runny or stuffy (congested) nose.  A sore throat.  New loss of taste or smell. Some people may also have stomach problems, such as nausea, vomiting, or diarrhea. Other people may not have any symptoms of COVID-19. How is this diagnosed? This condition may be diagnosed based on:  Your signs and symptoms, especially if: ? You live in an area with a COVID-19 outbreak. ?  You recently traveled to or from an area where the virus is common. ? You provide care for or live with a person who was diagnosed with COVID-19. ? You were exposed to a person who was diagnosed with COVID-19.  A physical exam.  Lab tests, which may include: ? Taking a sample of fluid from the back of your nose and throat (nasopharyngeal fluid), your nose, or your throat using a swab. ? A sample of mucus from your lungs (sputum). ? Blood tests.  Imaging tests, which may include, X-rays, CT scan, or ultrasound. How is this  treated? At present, there is no medicine to treat COVID-19. Medicines that treat other diseases are being used on a trial basis to see if they are effective against COVID-19. Your health care provider will talk with you about ways to treat your symptoms. For most people, the infection is mild and can be managed at home with rest, fluids, and over-the-counter medicines. Treatment for a serious infection usually takes places in a hospital intensive care unit (ICU). It may include one or more of the following treatments. These treatments are given until your symptoms improve.  Receiving fluids and medicines through an IV.  Supplemental oxygen. Extra oxygen is given through a tube in the nose, a face mask, or a hood.  Positioning you to lie on your stomach (prone position). This makes it easier for oxygen to get into the lungs.  Continuous positive airway pressure (CPAP) or bi-level positive airway pressure (BPAP) machine. This treatment uses mild air pressure to keep the airways open. A tube that is connected to a motor delivers oxygen to the body.  Ventilator. This treatment moves air into and out of the lungs by using a tube that is placed in your windpipe.  Tracheostomy. This is a procedure to create a hole in the neck so that a breathing tube can be inserted.  Extracorporeal membrane oxygenation (ECMO). This procedure gives the lungs a chance to recover by taking over the functions of the heart and lungs. It supplies oxygen to the body and removes carbon dioxide. Follow these instructions at home: Lifestyle  If you are sick, stay home except to get medical care. Your health care provider will tell you how long to stay home. Call your health care provider before you go for medical care.  Rest at home as told by your health care provider.  Do not use any products that contain nicotine or tobacco, such as cigarettes, e-cigarettes, and chewing tobacco. If you need help quitting, ask your  health care provider.  Return to your normal activities as told by your health care provider. Ask your health care provider what activities are safe for you. General instructions  Take over-the-counter and prescription medicines only as told by your health care provider.  Drink enough fluid to keep your urine pale yellow.  Keep all follow-up visits as told by your health care provider. This is important. How is this prevented?  There is no vaccine to help prevent COVID-19 infection. However, there are steps you can take to protect yourself and others from this virus. To protect yourself:   Do not travel to areas where COVID-19 is a risk. The areas where COVID-19 is reported change often. To identify high-risk areas and travel restrictions, check the CDC travel website: FatFares.com.br  If you live in, or must travel to, an area where COVID-19 is a risk, take precautions to avoid infection. ? Stay away from people who are sick. ?  Wash your hands often with soap and water for 20 seconds. If soap and water are not available, use an alcohol-based hand sanitizer. ? Avoid touching your mouth, face, eyes, or nose. ? Avoid going out in public, follow guidance from your state and local health authorities. ? If you must go out in public, wear a cloth face covering or face mask. Make sure your mask covers your nose and mouth. ? Avoid crowded indoor spaces. Stay at least 6 feet (2 meters) away from others. ? Disinfect objects and surfaces that are frequently touched every day. This may include:  Counters and tables.  Doorknobs and light switches.  Sinks and faucets.  Electronics, such as phones, remote controls, keyboards, computers, and tablets. To protect others: If you have symptoms of COVID-19, take steps to prevent the virus from spreading to others.  If you think you have a COVID-19 infection, contact your health care provider right away. Tell your health care team that you  think you may have a COVID-19 infection.  Stay home. Leave your house only to seek medical care. Do not use public transport.  Do not travel while you are sick.  Wash your hands often with soap and water for 20 seconds. If soap and water are not available, use alcohol-based hand sanitizer.  Stay away from other members of your household. Let healthy household members care for children and pets, if possible. If you have to care for children or pets, wash your hands often and wear a mask. If possible, stay in your own room, separate from others. Use a different bathroom.  Make sure that all people in your household wash their hands well and often.  Cough or sneeze into a tissue or your sleeve or elbow. Do not cough or sneeze into your hand or into the air.  Wear a cloth face covering or face mask. Make sure your mask covers your nose and mouth. Where to find more information  Centers for Disease Control and Prevention: PurpleGadgets.be  World Health Organization: https://www.castaneda.info/ Contact a health care provider if:  You live in or have traveled to an area where COVID-19 is a risk and you have symptoms of the infection.  You have had contact with someone who has COVID-19 and you have symptoms of the infection. Get help right away if:  You have trouble breathing.  You have pain or pressure in your chest.  You have confusion.  You have bluish lips and fingernails.  You have difficulty waking from sleep.  You have symptoms that get worse. These symptoms may represent a serious problem that is an emergency. Do not wait to see if the symptoms will go away. Get medical help right away. Call your local emergency services (911 in the [place] Do not drive yourself to the hospital. Let the emergency medical personnel know if you think you have COVID-19. Summary  COVID-19 is a respiratory infection that is caused by a virus. It is also known  as coronavirus disease or novel coronavirus. It can cause serious infections, such as pneumonia, acute respiratory distress syndrome, acute respiratory failure, or sepsis.  The virus that causes COVID-19 is contagious. This means that it can spread from person to person through droplets from breathing, speaking, singing, coughing, or sneezing.  You are more likely to develop a serious illness if you are 84 years of age or older, have a weak immune system, live in a nursing home, or have chronic disease.  There is no medicine to treat  COVID-19. Your health care provider will talk with you about ways to treat your symptoms.  Take steps to protect yourself and others from infection. Wash your hands often and disinfect objects and surfaces that are frequently touched every day. Stay away from people who are sick and wear a mask if you are sick. This information is not intended to replace advice given to you by your health care provider. Make sure you discuss any questions you have with your health care provider. Document Revised: 05/07/2019 Document Reviewed: 08/13/2018 Elsevier Patient Education  [place].

## 2019-08-09 NOTE — Progress Notes (Signed)
  Diagnosis: COVID-19  Physician: Dr. Joya Gaskins   Procedure: [facility] Med: bamlanivimab infusion - Provided patient with bamlanimivab fact sheet for patients, parents and caregivers prior to infusion.  Complications: No immediate complications noted.  Discharge: Discharged home   Janine Ores 08/09/2019

## 2020-04-18 ENCOUNTER — Encounter (HOSPITAL_COMMUNITY): Payer: Self-pay | Admitting: Physical Therapy

## 2020-04-18 NOTE — Therapy (Signed)
[place], [place], [address] Phone: [phone]   Fax:  [phone]  Patient Details  Name: Charles Evans MRN: [id number] Date of Birth: [birth year] Referring Provider:  Kary Kos MD Encounter Date: 04/18/2020  PHYSICAL THERAPY DISCHARGE SUMMARY  Visits from Start of Care: 3  Current functional level related to goals / functional outcomes: Unable to reassess as patient did not return for follow up visits.    Remaining deficits: Unable to reassess as patient did not return for follow up visits.    Education / Equipment: Patient DC per 3 x no show policy.  Plan: Patient agrees to discharge.  Patient goals were not met. Patient is being discharged due to not returning since the last visit.  ?????        3:11 PM, 04/18/20 Josue Hector PT DPT  Physical Therapist with [facility]  [phone] [place], [place], [address] Phone: [phone]   Fax:  [phone]

## 2020-04-21 ENCOUNTER — Ambulatory Visit: Payer: Medicaid Other | Admitting: Urology

## 2020-08-16 ENCOUNTER — Ambulatory Visit: Payer: Medicaid Other | Admitting: Urology

## 2020-10-04 ENCOUNTER — Ambulatory Visit (INDEPENDENT_AMBULATORY_CARE_PROVIDER_SITE_OTHER): Payer: Medicaid Other | Admitting: Urology

## 2020-10-04 ENCOUNTER — Other Ambulatory Visit: Payer: Self-pay

## 2020-10-04 ENCOUNTER — Encounter: Payer: Self-pay | Admitting: Urology

## 2020-10-04 VITALS — BP 129/79 | HR 74 | Temp 97.1°F | Ht 76.0 in | Wt 206.0 lb

## 2020-10-04 DIAGNOSIS — R972 Elevated prostate specific antigen [PSA]: Secondary | ICD-10-CM

## 2020-10-04 LAB — URINALYSIS, ROUTINE W REFLEX MICROSCOPIC
Bilirubin, UA: NEGATIVE
Glucose, UA: NEGATIVE
Ketones, UA: NEGATIVE
Leukocytes,UA: NEGATIVE
Nitrite, UA: NEGATIVE
Protein,UA: NEGATIVE
RBC, UA: NEGATIVE
Specific Gravity, UA: 1.025 (ref 1.005–1.030)
Urobilinogen, Ur: 0.2 mg/dL (ref 0.2–1.0)
pH, UA: 6 (ref 5.0–7.5)

## 2020-10-04 LAB — BLADDER SCAN AMB NON-IMAGING: Scan Result: 13

## 2020-10-04 NOTE — Patient Instructions (Signed)
  Prostate-Specific Antigen Test Why am I having this test? The prostate-specific antigen (PSA) test is a screening test for prostate cancer. It can identify early signs of prostate cancer, which may allow for more effective treatment. Your health care provider may recommend that you have a PSA test starting at age 64 or that you have one earlier or later, depending on your risk factors for prostate cancer. You may also have a PSA test:  To monitor treatment of prostate cancer.  To check whether prostate cancer has returned after treatment.  If you have signs of other conditions that can affect PSA levels, such as: ? An enlarged prostate that is not caused by cancer (benign prostatic hyperplasia, BPH). This condition is very common in older men. ? A prostate infection. What is being tested? This test measures the amount of PSA in your blood. PSA is a protein that is made in the prostate. The prostate naturally produces more PSA as you age, but very high levels may be a sign of a medical condition. What kind of sample is taken? A blood sample is required for this test. It is usually collected by inserting a needle into a blood vessel or by sticking a finger with a small needle. Blood for this test should be drawn before having an exam of the prostate.   How do I prepare for this test? Do not ejaculate starting 24 hours before your test, or as long as told by your health care provider. Tell a health care provider about:  Any allergies you have.  All medicines you are taking, including vitamins, herbs, eye drops, creams, and over-the-counter medicines. This also includes: ? Medicines to assist with hair growth, such as finasteride. ? Any recent exposure to a medicine called diethylstilbestrol.  Any blood disorders you have.  Any recent procedures you have had, especially any procedures involving the prostate or rectum.  Any medical conditions you have.  Any recent urinary tract  infections (UTIs) you have had. How are the results reported? Your test results will be reported as a value that indicates how much PSA is in your blood. This will be given as nanograms of PSA per milliliter of blood (ng/mL). Your health care provider will compare your results to normal ranges that were established after testing a large group of people (reference ranges). Reference ranges may vary among labs and hospitals. PSA levels vary from person to person and generally increase with age. Because of this variation, there is no single PSA value that is considered normal for everyone. Instead, PSA reference ranges are used to describe whether your PSA levels are considered low or high (elevated). Common reference ranges are:  Low: 0-2.5 ng/mL.  Slightly to moderately elevated: 2.6-10.0 ng/mL.  Moderately elevated: 10.0-19.9 ng/mL.  Significantly elevated: 20 ng/mL or greater. Sometimes, the test results may report that a condition is present when it is not present (false-positive result). What do the results mean? A test result that is higher than 4 ng/mL may mean that you are at an increased risk for prostate cancer. However, a PSA test by itself is not enough to diagnose prostate cancer. High PSA levels may also be caused by the natural aging process, prostate infection, or BPH. PSA screening cannot tell you if your PSA is high due to cancer or a different cause. A prostate biopsy is the only way to diagnose prostate cancer. A risk of having the PSA test is diagnosing and treating prostate cancer that would never   have caused any symptoms or problems (overdiagnosis and overtreatment). Talk with your health care provider about what your results mean. Questions to ask your health care provider Ask your health care provider, or the department that is doing the test:  When will my results be ready?  How will I get my results?  What are my treatment options?  What other tests do I  need?  What are my next steps? Summary  The prostate-specific antigen (PSA) test is a screening test for prostate cancer.  Your health care provider may recommend that you have a PSA test starting at age 64 or that you have one earlier or later, depending on your risk factors for prostate cancer.  A test result that is higher than 4 ng/mL may mean that you are at an increased risk for prostate cancer. However, elevated levels can be caused by a number of conditions other than prostate cancer.  Talk with your health care provider about what your results mean. This information is not intended to replace advice given to you by your health care provider. Make sure you discuss any questions you have with your health care provider. Document Revised: 03/23/2020 Document Reviewed: 03/23/2020 Elsevier Patient Education  2021 [employer].  

## 2020-10-04 NOTE — Progress Notes (Signed)
10/04/2020 3:22 PM   Charles Evans [birth year] [id number]  Referring provider: Celene Squibb, MD [facility],   [id number]  Elevated PSA  HPI: Mr Charles Evans is a 65yo here for evaluation of elevated PSA. PSA was 3.2 last year and increased to 4.6 this year. He has mild LUTS on flomax 0.4mg  daily. No family hx of prostate cancer. No other complaints today.    PMH: Past Medical History:  Diagnosis Date  . Hypertension     Surgical History: Past Surgical History:  Procedure Laterality Date  . BACK SURGERY    . COLONOSCOPY N/A 08/21/2015   Procedure: COLONOSCOPY;  Surgeon: Danie Binder, MD;  [facility];  Service: Endoscopy;  Laterality: N/A;  1430  . ESOPHAGOGASTRODUODENOSCOPY N/A 08/21/2015   Procedure: ESOPHAGOGASTRODUODENOSCOPY (EGD);  Surgeon: Danie Binder, MD;  [facility];  Service: Endoscopy;  Laterality: N/A;  . HERNIA REPAIR     left groin  . LUMBAR LAMINECTOMY/DECOMPRESSION MICRODISCECTOMY Left 09/28/2018   Procedure: Microdiscectomy - L3-L4 - left;  Surgeon: Kary Kos, MD;  Location: [facility];  Service: Neurosurgery;  Laterality: Left;  Microdiscectomy - L3-L4 - left    Home Medications:  Allergies as of 10/04/2020      Reactions   Decadron [dexamethasone] Other (See Comments)   Severe hiccups lasting 15 days   Penicillins Swelling, Rash, Other (See Comments)   Did it involve swelling of the face/tongue/throat, SOB, or low BP? Yes Did it involve sudden or severe rash/hives, skin peeling, or any reaction on the inside of your mouth or nose? No Did you need to seek medical attention at a hospital or doctor's office? No When did it last happen?year  If all above answers are "NO", may proceed with cephalosporin use.      Medication List       Accurate as of October 04, 2020  3:22 PM. If you have any questions, ask your nurse or doctor.        amLODipine 10 MG tablet Commonly known as: NORVASC Take 10 mg by mouth  daily.   amoxicillin-clavulanate 875-125 MG tablet Commonly known as: AUGMENTIN Take 1 tablet by mouth every 12 (twelve) hours.   aspirin EC 81 MG tablet Take 81 mg by mouth daily.   cyclobenzaprine 10 MG tablet Commonly known as: FLEXERIL Take 1 tablet (10 mg total) by mouth 3 (three) times daily as needed for muscle spasms.   ibuprofen 200 MG tablet Commonly known as: ADVIL Take 400 mg by mouth every 6 (six) hours as needed.   lidocaine 2 % solution Commonly known as: XYLOCAINE Use as directed 15 mLs in the mouth or throat as needed for mouth pain.   lisinopril-hydrochlorothiazide 20-25 MG tablet Commonly known as: ZESTORETIC Take 1 tablet by mouth daily.   metFORMIN 500 MG tablet Commonly known as: GLUCOPHAGE Take 500 mg by mouth daily.   oxyCODONE-acetaminophen 5-325 MG tablet Commonly known as: PERCOCET/ROXICET Take 2 tablets by mouth every 4 (four) hours as needed for severe pain.   oxyCODONE-acetaminophen 7.5-325 MG tablet Commonly known as: PERCOCET Take 1 tablet by mouth every 6 (six) hours as needed.   tamsulosin 0.4 MG Caps capsule Commonly known as: FLOMAX Take 0.4 mg by mouth daily.       Allergies:  Allergies  Allergen Reactions  . Decadron [Dexamethasone] Other (See Comments)    Severe hiccups lasting 15 days  . Penicillins Swelling, Rash and Other (See Comments)  Did it involve swelling of the face/tongue/throat, SOB, or low BP? Yes Did it involve sudden or severe rash/hives, skin peeling, or any reaction on the inside of your mouth or nose? No Did you need to seek medical attention at a hospital or doctor's office? No When did it last happen?year  If all above answers are "NO", may proceed with cephalosporin use.      Family History: Family History  Problem Relation Age of Onset  . Heart disease Father   . Colon cancer Cousin        less than 60    Social History:  reports that he has been smoking cigarettes. He has a 20.00  pack-year smoking history. He has never used smokeless tobacco. He reports that he does not drink alcohol and does not use drugs.  ROS: All other review of systems were reviewed and are negative except what is noted above in HPI  Physical Exam: BP 129/79   Pulse 74   Temp (!) 97.1 F (36.2 C)   Ht 6\' 4"  (1.93 m)   Wt 206 lb (93.4 kg)   BMI 25.08 kg/m   Constitutional:  Alert and oriented, No acute distress. HEENT: Charles Evans AT, moist mucus membranes.  Trachea midline, no masses. Cardiovascular: No clubbing, cyanosis, or edema. Respiratory: Normal respiratory effort, no increased work of breathing. GI: Abdomen is soft, nontender, nondistended, no abdominal masses GU: No CVA tenderness. Circumcised phallus. No masses/lesions on penis, testis, scrotum. Prostate 60g smooth no nodules no induration.  Lymph: No cervical or inguinal lymphadenopathy. Skin: No rashes, bruises or suspicious lesions. Neurologic: Grossly intact, no focal deficits, moving all 4 extremities. Psychiatric: Normal mood and affect.  Laboratory Data: Lab Results  Component Value Date   WBC 7.4 09/24/2018   HGB 15.8 09/24/2018   HCT 48.2 09/24/2018   MCV 88.1 09/24/2018   PLT 236 09/24/2018    Lab Results  Component Value Date   CREATININE 1.09 09/24/2018    No results found for: PSA  No results found for: TESTOSTERONE  No results found for: HGBA1C  Urinalysis No results found for: COLORURINE, APPEARANCEUR, LABSPEC, PHURINE, GLUCOSEU, HGBUR, BILIRUBINUR, KETONESUR, PROTEINUR, UROBILINOGEN, NITRITE, LEUKOCYTESUR  No results found for: LABMICR, Charles Evans, RBCUA, LABEPIT, MUCUS, BACTERIA  Pertinent Imaging:  No results found for this or any previous visit.  No results found for this or any previous visit.  No results found for this or any previous visit.  No results found for this or any previous visit.  No results found for this or any previous visit.  No results found for this or any previous  visit.  No results found for this or any previous visit.  No results found for this or any previous visit.   Assessment & Plan:    1. Elevated PSA The patient and I talked about etiologies of elevated PSA.  We discussed the possible relationship between elevated PSA and prostate cancer, BPH, prostatitis, infection trauma and recent ejaculations. I recommended that we follow-up with a repeat PSA in 6 months.  If it remains elevated with a positive rising trend we will discuss prostate biopsy at his follow-up appointment. - Urinalysis, Routine w reflex microscopic - BLADDER SCAN AMB NON-IMAGING   No follow-ups on file.  Nicolette Bang, MD  [facility] [place]

## 2020-10-04 NOTE — Progress Notes (Signed)
Bladder Scan Patient cannot void: 13 ml Performed By: Durenda Guthrie, lpn    Urological Symptom Review  Patient is experiencing the following symptoms: Hard to postpone urination Leakage of urine Stream starts and stops Trouble starting stream Weak stream Erection problems (male only)   Review of Systems  Gastrointestinal (upper)  : Negative for upper GI symptoms  Gastrointestinal (lower) : Negative for lower GI symptoms  Constitutional : Negative for symptoms  Skin: Negative for skin symptoms  Eyes: Negative for eye symptoms  Ear/Nose/Throat : Negative for Ear/Nose/Throat symptoms  Hematologic/Lymphatic: Negative for Hematologic/Lymphatic symptoms  Cardiovascular : Negative for cardiovascular symptoms  Respiratory : Negative for respiratory symptoms  Endocrine: Negative for endocrine symptoms  Musculoskeletal: Negative for musculoskeletal symptoms  Neurological: Negative for neurological symptoms  Psychologic: Negative for psychiatric symptoms

## 2020-10-13 ENCOUNTER — Other Ambulatory Visit: Payer: Self-pay

## 2020-10-13 DIAGNOSIS — R972 Elevated prostate specific antigen [PSA]: Secondary | ICD-10-CM

## 2020-10-13 DIAGNOSIS — N400 Enlarged prostate without lower urinary tract symptoms: Secondary | ICD-10-CM

## 2020-10-13 MED ORDER — ALFUZOSIN HCL ER 10 MG PO TB24: 10.0000 mg | ORAL_TABLET | Freq: Every day | ORAL | 1 refills | Status: DC
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
## 2020-11-08 ENCOUNTER — Other Ambulatory Visit: Payer: Self-pay | Admitting: Urology

## 2020-11-08 DIAGNOSIS — N400 Enlarged prostate without lower urinary tract symptoms: Secondary | ICD-10-CM

## 2020-12-12 ENCOUNTER — Other Ambulatory Visit: Payer: Self-pay | Admitting: Urology

## 2020-12-12 DIAGNOSIS — N400 Enlarged prostate without lower urinary tract symptoms: Secondary | ICD-10-CM

## 2021-01-08 ENCOUNTER — Emergency Department (HOSPITAL_COMMUNITY)
Admission: EM | Admit: 2021-01-08 | Discharge: 2021-01-08 | Disposition: A | Discharge status: Home / Self Care | Payer: Medicaid Other | Attending: Emergency Medicine | Admitting: Emergency Medicine

## 2021-01-08 ENCOUNTER — Other Ambulatory Visit: Payer: Self-pay

## 2021-01-08 ENCOUNTER — Encounter (HOSPITAL_COMMUNITY): Payer: Self-pay | Admitting: Emergency Medicine

## 2021-01-08 ENCOUNTER — Emergency Department (HOSPITAL_COMMUNITY): Payer: Medicaid Other

## 2021-01-08 DIAGNOSIS — F17210 Nicotine dependence, cigarettes, uncomplicated: Secondary | ICD-10-CM | POA: Insufficient documentation

## 2021-01-08 DIAGNOSIS — R001 Bradycardia, unspecified: Secondary | ICD-10-CM | POA: Diagnosis not present

## 2021-01-08 DIAGNOSIS — R079 Chest pain, unspecified: Secondary | ICD-10-CM

## 2021-01-08 DIAGNOSIS — Z79899 Other long term (current) drug therapy: Secondary | ICD-10-CM | POA: Insufficient documentation

## 2021-01-08 DIAGNOSIS — I10 Essential (primary) hypertension: Secondary | ICD-10-CM | POA: Insufficient documentation

## 2021-01-08 DIAGNOSIS — R0789 Other chest pain: Secondary | ICD-10-CM | POA: Insufficient documentation

## 2021-01-08 DIAGNOSIS — R059 Cough, unspecified: Secondary | ICD-10-CM | POA: Diagnosis not present

## 2021-01-08 LAB — CBC WITH DIFFERENTIAL/PLATELET
Abs Immature Granulocytes: 0.02 10*3/uL (ref 0.00–0.07)
Basophils Absolute: 0 10*3/uL (ref 0.0–0.1)
Basophils Relative: 0 %
Eosinophils Absolute: 0.1 10*3/uL (ref 0.0–0.5)
Eosinophils Relative: 2 %
HCT: 46.8 % (ref 39.0–52.0)
Hemoglobin: 15.5 g/dL (ref 13.0–17.0)
Immature Granulocytes: 0 %
Lymphocytes Relative: 29 %
Lymphs Abs: 2.1 10*3/uL (ref 0.7–4.0)
MCH: 30.3 pg (ref 26.0–34.0)
MCHC: 33.1 g/dL (ref 30.0–36.0)
MCV: 91.6 fL (ref 80.0–100.0)
Monocytes Absolute: 0.5 10*3/uL (ref 0.1–1.0)
Monocytes Relative: 7 %
Neutro Abs: 4.4 10*3/uL (ref 1.7–7.7)
Neutrophils Relative %: 62 %
Platelets: 225 10*3/uL (ref 150–400)
RBC: 5.11 MIL/uL (ref 4.22–5.81)
RDW: 14.6 % (ref 11.5–15.5)
WBC: 7.1 10*3/uL (ref 4.0–10.5)
nRBC: 0 % (ref 0.0–0.2)

## 2021-01-08 LAB — TROPONIN I (HIGH SENSITIVITY)
Troponin I (High Sensitivity): 4 ng/L (ref <18)
Troponin I (High Sensitivity): 4 ng/L (ref <18)

## 2021-01-08 LAB — COMPREHENSIVE METABOLIC PANEL
ALT: 14 U/L (ref 0–44)
AST: 21 U/L (ref 15–41)
Albumin: 3.4 g/dL — ABNORMAL LOW (ref 3.5–5.0)
Alkaline Phosphatase: 65 U/L (ref 38–126)
Anion gap: 8 (ref 5–15)
BUN: 15 mg/dL (ref 8–23)
CO2: 26 mmol/L (ref 22–32)
Calcium: 8.7 mg/dL — ABNORMAL LOW (ref 8.9–10.3)
Chloride: 105 mmol/L (ref 98–111)
Creatinine, Ser: 1 mg/dL (ref 0.61–1.24)
GFR, Estimated: >60 mL/min (ref >60)
Glucose, Bld: 140 mg/dL — ABNORMAL HIGH (ref 70–99)
Potassium: 3.4 mmol/L — ABNORMAL LOW (ref 3.5–5.1)
Sodium: 139 mmol/L (ref 135–145)
Total Bilirubin: 0.7 mg/dL (ref 0.3–1.2)
Total Protein: 6.7 g/dL (ref 6.5–8.1)

## 2021-01-08 NOTE — Discharge Instructions (Addendum)
You will need to follow-up with the family doctor and the cardiologist.  I have given you the phone number for the cardiologist above, please make an appointment to be seen within the next week.  Your testing has been normal however if you should develop severe or worsening symptoms please return to the emergency department immediately.  Please go back to taking a baby aspirin every day until you follow-up with the heart doctor

## 2021-01-08 NOTE — ED Triage Notes (Addendum)
Pt c/o right sided cp since 1300 yesterday. Some SOB, pain worse with deep breath. Denies pain at present states comes with coughing or reaching movement.

## 2021-01-08 NOTE — ED Provider Notes (Signed)
Emergency Medicine Provider Triage Evaluation Note  Charles Evans 65 y.o. male was evaluated in triage.  Pt complains of chest pain that began yesterday.  He states that he just got home from church when chest pain started.  He states it is on the right side.  It has been constant since it started.  He did not have any associated nausea, vomiting, diaphoresis.  He states that the pain is worse when he coughs, takes a deep breath or moves and bends.  He states he has had some cough that is productive of phlegm.  No fevers, chills.  He denies any abdominal pain, nausea/vomiting.  He does smoke. He denies any exogenous hormone use, recent immobilization, prior history of DVT/PE, recent surgery, leg swelling, or long travel.   Review of Systems  Positive: CP Negative: N/v, abd pain  Physical Exam  BP 134/82   Pulse 70   Temp 98.2 F (36.8 C) (Oral)   Resp 18   Ht 5\' 4"  (1.626 m)   Wt 65.8 kg   SpO2 100%   BMI 24.89 kg/m  Gen:   Awake, no distress   HEENT:  Atraumatic  Resp:  Normal effort  Cardiac:  Normal rate  Abd:   Nondistended, nontender  MSK:   Moves extremities without difficulty  Neuro:  Speech clear   Other:      Medical Decision Making  Medically screening exam initiated at 9:53 AM  Appropriate orders placed.  Charles Evans was informed that the remainder of the evaluation will be completed by another provider, this initial triage assessment does not replace that evaluation. They are counseled that they will need to remain in the ED until the completion of their workup, including full H&P and results of any tests.  Risks of leaving the emergency department prior to completion of treatment were discussed. Patient was advised to inform ED staff if they are leaving before their treatment is complete. The patient acknowledged these risks and time was allowed for questions.     The patient appears stable so that the remainder of the MSE may be completed by another  provider.  Clinical Impression  Chest pain   Portions of this note were generated with Dragon dictation software. Dictation errors may occur despite best attempts at proofreading.     Volanda Napoleon, PA-C 01/08/21 [id number]    Noemi Chapel, MD 01/08/21 1149

## 2021-01-08 NOTE — ED Provider Notes (Signed)
[facility] EMERGENCY DEPARTMENT Provider Note   CSN: [id number] Arrival date & time: 01/08/21  6213     History Chief Complaint  Patient presents with   Chest Pain    Charles Evans is a 65 y.o. male.   Chest Pain   Pt has had CP, sharp - stabbing - R sided - intermittent and associated with deep breathing and position - started yesterday - it is not exertional -he has no history of exertional symptoms, no history of coronary disease, he does have a history of atrial fibrillation in the past, he takes antihypertensives but does not take any blood thinners, he does not take any other medicines for rate control, he is just on amlodipine and lisinopril hydrochlorothiazide.  At this time the patient is having no symptoms, he is not short of breath, he does report having a cough for a couple of months productive of occasional phlegm.  No fevers, no swelling of the legs, no diarrhea, no other symptoms.  No medications given prior to arrival.  Past Medical History:  Diagnosis Date   Hypertension     Patient Active Problem List   Diagnosis Date Noted   HNP (herniated nucleus pulposus), lumbar 09/28/2018   Encounter for screening colonoscopy    Intractable hiccups 08/15/2015   Cough 08/15/2015   Colon cancer screening 08/15/2015   Dyspepsia 08/15/2015   Chest pain 05/29/2012   Atrial fibrillation ([place]) 05/29/2012   HTN (hypertension) 05/29/2012   Hyperglycemia 05/29/2012   Hypokalemia 05/29/2012    Past Surgical History:  Procedure Laterality Date   BACK SURGERY     COLONOSCOPY N/A 08/21/2015   Procedure: COLONOSCOPY;  Surgeon: Danie Binder, MD;  [facility];  Service: Endoscopy;  Laterality: N/A;  1430   ESOPHAGOGASTRODUODENOSCOPY N/A 08/21/2015   Procedure: ESOPHAGOGASTRODUODENOSCOPY (EGD);  Surgeon: Danie Binder, MD;  [facility];  Service: Endoscopy;  Laterality: N/A;   HERNIA REPAIR     left groin   LUMBAR LAMINECTOMY/DECOMPRESSION  MICRODISCECTOMY Left 09/28/2018   Procedure: Microdiscectomy - L3-L4 - left;  Surgeon: Kary Kos, MD;  Location: [facility];  Service: Neurosurgery;  Laterality: Left;  Microdiscectomy - L3-L4 - left       Family History  Problem Relation Age of Onset   Heart disease Father    Colon cancer Cousin        less than 60    Social History   Tobacco Use   Smoking status: Every Day    Packs/day: 1.00    Years: 20.00    Pack years: 20.00    Types: Cigarettes   Smokeless tobacco: Never  Vaping Use   Vaping Use: Never used  Substance Use Topics   Alcohol use: No   Drug use: No    Home Medications Prior to Admission medications   Medication Sig Start Date End Date Taking? Authorizing Provider  alfuzosin (UROXATRAL) 10 MG 24 hr tablet TAKE 1 TABLET (10 MG TOTAL) BY MOUTH DAILY WITH BREAKFAST. 12/19/20   McKenzie, Candee Furbish, MD  amLODipine (NORVASC) 10 MG tablet Take 10 mg by mouth daily.  01/30/17   [provider]  lisinopril-hydrochlorothiazide (PRINZIDE,ZESTORETIC) 20-25 MG tablet Take 1 tablet by mouth daily. 01/30/17   [provider]  metFORMIN (GLUCOPHAGE) 500 MG tablet Take 500 mg by mouth daily. 07/30/20   [provider]    Allergies    Decadron [dexamethasone] and Penicillins  Review of Systems   Review of Systems  Cardiovascular:  Positive for chest pain.  All other systems reviewed and are negative.  Physical Exam Updated Vital Signs BP 120/86   Pulse (!) 50   Temp 98.2 F (36.8 C)   Resp 18   Ht 1.93 m (6\' 4" )   Wt 93 kg   SpO2 98%   BMI 24.95 kg/m   Physical Exam Vitals and nursing note reviewed.  Constitutional:      General: He is not in acute distress.    Appearance: He is well-developed.  HENT:     Head: Normocephalic and atraumatic.     Mouth/Throat:     Pharynx: No oropharyngeal exudate.  Eyes:     General: No scleral icterus.       Right eye: No discharge.        Left eye: No discharge.     Conjunctiva/sclera:  Conjunctivae normal.     Pupils: Pupils are equal, round, and reactive to light.  Neck:     Thyroid: No thyromegaly.     Vascular: No JVD.  Cardiovascular:     Rate and Rhythm: Normal rate and regular rhythm.     Heart sounds: Normal heart sounds. No murmur heard.   No friction rub. No gallop.  Pulmonary:     Effort: Pulmonary effort is normal. No respiratory distress.     Breath sounds: Normal breath sounds. No wheezing or rales.  Abdominal:     General: Bowel sounds are normal. There is no distension.     Palpations: Abdomen is soft. There is no mass.     Tenderness: There is no abdominal tenderness.  Musculoskeletal:        General: No tenderness. Normal range of motion.     Cervical back: Normal range of motion and neck supple.  Lymphadenopathy:     Cervical: No cervical adenopathy.  Skin:    General: Skin is warm and dry.     Findings: No erythema or rash.  Neurological:     Mental Status: He is alert.     Coordination: Coordination normal.  Psychiatric:        Behavior: Behavior normal.    ED Results / Procedures / Treatments   Labs (all labs ordered are listed, but only abnormal results are displayed) Labs Reviewed  COMPREHENSIVE METABOLIC PANEL - Abnormal; Notable for the following components:      Result Value   Potassium 3.4 (*)    Glucose, Bld 140 (*)    Calcium 8.7 (*)    Albumin 3.4 (*)    All other components within normal limits  CBC WITH DIFFERENTIAL/PLATELET  TROPONIN I (HIGH SENSITIVITY)  TROPONIN I (HIGH SENSITIVITY)    EKG EKG Interpretation  Date/Time:  Monday January 08 2021 09:46:54 EDT Ventricular Rate:  57 PR Interval:  144 QRS Duration: 144 QT Interval:  488 QTC Calculation: 474 R Axis:   7 Text Interpretation: Sinus bradycardia Right bundle branch block Abnormal ECG since last tracing no significant change Confirmed by Noemi Chapel [phone]) on 01/08/2021 9:49:24 AM  Radiology DG Chest 2 View  Result Date: 01/08/2021 CLINICAL DATA:   Chest pain EXAM: CHEST - 2 VIEW COMPARISON:  None. FINDINGS: Normal mediastinum and cardiac silhouette. Normal pulmonary vasculature. No evidence of effusion, infiltrate, or pneumothorax. No acute bony abnormality. IMPRESSION: No acute cardiopulmonary process. Electronically Signed   By: Suzy Bouchard M.D.   On: 01/08/2021 11:02    Procedures Procedures   Medications Ordered in ED Medications - No data to display  ED Course  I have reviewed the triage vital signs and the nursing notes.  Pertinent labs & imaging results that were available during my care of the patient were reviewed by me and considered in my medical decision making (see chart for details).    MDM Rules/Calculators/A&P                           Ultimately this patient's exam is unremarkable, vital signs are normal and his EKG was unremarkable.  Chest x-ray shows no acute findings especially no pneumothorax or abnormal mediastinum.  Lab work is unremarkable and his initial troponin is on measurably low.  Waiting on second troponin prior to considering discharge.  The patient is agreeable and asymptomatic at this time.  Of note when I have him take a deep breath or lean forward it does reproduce his pain exactly.  He states the pain is relieved when I put pressure on the right side of his chest.  This atypical nature of pain suggest a noncardiac etiology  Troponin is negative, repeat is negative, x-rays unremarkable, labs are unremarkable, patient's pain is atypical, vital signs are reassuring, stable for discharge  Referred to cardiology as an outpatient  Final Clinical Impression(s) / ED Diagnoses Final diagnoses:  Right-sided chest pain    Rx / DC Orders ED Discharge Orders     None        Noemi Chapel, MD 01/08/21 1249

## 2021-01-12 ENCOUNTER — Other Ambulatory Visit: Payer: Self-pay | Admitting: Urology

## 2021-01-12 DIAGNOSIS — N400 Enlarged prostate without lower urinary tract symptoms: Secondary | ICD-10-CM

## 2021-01-17 ENCOUNTER — Other Ambulatory Visit (HOSPITAL_COMMUNITY): Payer: Self-pay | Admitting: Family Medicine

## 2021-01-17 DIAGNOSIS — M25512 Pain in left shoulder: Secondary | ICD-10-CM | POA: Insufficient documentation

## 2021-02-17 ENCOUNTER — Other Ambulatory Visit: Payer: Self-pay | Admitting: Urology

## 2021-02-17 DIAGNOSIS — N400 Enlarged prostate without lower urinary tract symptoms: Secondary | ICD-10-CM

## 2021-03-26 ENCOUNTER — Other Ambulatory Visit: Payer: Self-pay | Admitting: Urology

## 2021-03-26 DIAGNOSIS — N400 Enlarged prostate without lower urinary tract symptoms: Secondary | ICD-10-CM

## 2021-04-03 ENCOUNTER — Other Ambulatory Visit: Payer: Medicaid Other

## 2021-04-10 ENCOUNTER — Other Ambulatory Visit: Payer: Self-pay

## 2021-04-10 ENCOUNTER — Encounter: Payer: Self-pay | Admitting: Urology

## 2021-04-10 ENCOUNTER — Telehealth (INDEPENDENT_AMBULATORY_CARE_PROVIDER_SITE_OTHER): Payer: Medicaid Other | Admitting: Urology

## 2021-04-10 DIAGNOSIS — N400 Enlarged prostate without lower urinary tract symptoms: Secondary | ICD-10-CM

## 2021-04-10 DIAGNOSIS — R351 Nocturia: Secondary | ICD-10-CM

## 2021-04-10 DIAGNOSIS — R972 Elevated prostate specific antigen [PSA]: Secondary | ICD-10-CM | POA: Diagnosis not present

## 2021-04-10 MED ORDER — ALFUZOSIN HCL ER 10 MG PO TB24: 10.0000 mg | ORAL_TABLET | Freq: Every day | ORAL | 11 refills | Status: DC

## 2021-04-10 NOTE — Progress Notes (Signed)
04/10/2021 1:44 PM   Charles Evans 03/30/1956 [id number]  Referring provider: Celene Squibb, MD [address] [place],  [place] [address]  Patient location: home Physician location: office I connected with  Charles Evans on 04/10/21 by a video enabled telemedicine application and verified that I am speaking with the correct person using two identifiers.   I discussed the limitations of evaluation and management by telemedicine. The patient expressed understanding and agreed to proceed.    Followup elevated PSA   HPI: Mr Fluke is a 65yo here for followup for elevated PSA and BPH with nocturia. No recent PSA. He notes improvement in his urinary stream and resolution of his nocturia on uroxatral 10mg  daily. He has hesitancy in the morning which resolved by late morning. No dysuria or hematuria. No other complaints today   PMH: Past Medical History:  Diagnosis Date   Hypertension     Surgical History: Past Surgical History:  Procedure Laterality Date   BACK SURGERY     COLONOSCOPY N/A 08/21/2015   Procedure: COLONOSCOPY;  Surgeon: Danie Binder, MD;  [facility];  Service: Endoscopy;  Laterality: N/A;  1430   ESOPHAGOGASTRODUODENOSCOPY N/A 08/21/2015   Procedure: ESOPHAGOGASTRODUODENOSCOPY (EGD);  Surgeon: Danie Binder, MD;  [facility];  Service: Endoscopy;  Laterality: N/A;   HERNIA REPAIR     left groin   LUMBAR LAMINECTOMY/DECOMPRESSION MICRODISCECTOMY Left 09/28/2018   Procedure: Microdiscectomy - L3-L4 - left;  Surgeon: Kary Kos, MD;  Location: [place];  Service: Neurosurgery;  Laterality: Left;  Microdiscectomy - L3-L4 - left    Home Medications:  Allergies as of 04/10/2021       Reactions   Decadron [dexamethasone] Other (See Comments)   Severe hiccups lasting 15 days   Penicillins Swelling, Rash, Other (See Comments)   Did it involve swelling of the face/tongue/throat, SOB, or low BP? Yes Did it involve sudden or severe rash/hives,  skin peeling, or any reaction on the inside of your mouth or nose? No Did you need to seek medical attention at a hospital or doctor's office? No When did it last happen?      year  If all above answers are "NO", may proceed with cephalosporin use.        Medication List        Accurate as of April 10, 2021  1:44 PM. If you have any questions, ask your nurse or doctor.          alfuzosin 10 MG 24 hr tablet Commonly known as: UROXATRAL TAKE 1 TABLET (10 MG TOTAL) BY MOUTH DAILY WITH BREAKFAST.   amLODipine 10 MG tablet Commonly known as: NORVASC Take 10 mg by mouth daily.   lisinopril-hydrochlorothiazide 20-25 MG tablet Commonly known as: ZESTORETIC Take 1 tablet by mouth daily.   metFORMIN 500 MG tablet Commonly known as: GLUCOPHAGE Take 500 mg by mouth daily.        Allergies:  Allergies  Allergen Reactions   Decadron [Dexamethasone] Other (See Comments)    Severe hiccups lasting 15 days   Penicillins Swelling, Rash and Other (See Comments)    Did it involve swelling of the face/tongue/throat, SOB, or low BP? Yes Did it involve sudden or severe rash/hives, skin peeling, or any reaction on the inside of your mouth or nose? No Did you need to seek medical attention at a hospital or doctor's office? No When did it last happen?      year  If all above answers are "NO", may proceed with cephalosporin use.      Family History: Family History  Problem Relation Age of Onset   Heart disease Father    Colon cancer Cousin        less than 59    Social History:  reports that he has been smoking cigarettes. He has a 20.00 pack-year smoking history. He has never used smokeless tobacco. He reports that he does not drink alcohol and does not use drugs.  ROS: All other review of systems were reviewed and are negative except what is noted above in HPI   Laboratory Data: Lab Results  Component Value Date   WBC 7.1 01/08/2021   HGB 15.5 01/08/2021   HCT 46.8  01/08/2021   MCV 91.6 01/08/2021   PLT 225 01/08/2021    Lab Results  Component Value Date   CREATININE 1.00 01/08/2021    No results found for: PSA  No results found for: TESTOSTERONE  No results found for: HGBA1C  Urinalysis    Component Value Date/Time   APPEARANCEUR Clear 10/04/2020 1526   GLUCOSEU Negative 10/04/2020 1526   BILIRUBINUR Negative 10/04/2020 1526   PROTEINUR Negative 10/04/2020 1526   NITRITE Negative 10/04/2020 1526   LEUKOCYTESUR Negative 10/04/2020 1526    Lab Results  Component Value Date   LABMICR Comment 10/04/2020    Pertinent Imaging:  No results found for this or any previous visit.  No results found for this or any previous visit.  No results found for this or any previous visit.  No results found for this or any previous visit.  No results found for this or any previous visit.  No results found for this or any previous visit.  No results found for this or any previous visit.  No results found for this or any previous visit.   Assessment & Plan:    1. Elevated PSA -PSA today, will call with results. If his PSa is stable I will see him back in 6 months with a PSA  2. Benign prostatic hyperplasia, unspecified whether lower urinary tract symptoms present -Start taking uroxatral 10mg  QHS  3. Nocturia -Switch uroxatral to 10mg  QHS   No follow-ups on file.  Nicolette Bang, MD  [facility] [place]

## 2021-04-10 NOTE — Patient Instructions (Signed)
Benign Prostatic Hyperplasia Benign prostatic hyperplasia (BPH) is an enlarged prostate gland that is caused by the normal aging process and not by cancer. The prostate is a walnut-sized gland that is involved in the production of semen. It is located in front of the rectum and below the bladder. The bladder stores urine and the urethra is the tube that carries the urine out of the body. The prostate may get bigger as a man gets older. An enlarged prostate can press on the urethra. This can make it harder to pass urine. The build-up of urine in the bladder can cause infection. Back pressure and infection may progress to bladder damage and kidney (renal) failure. What are the causes? This condition is part of a normal aging process. However, not all men develop problems from this condition. If the prostate enlarges away from the urethra, urine flow will not be blocked. If it enlarges toward the urethra and compresses it, there will be problems passing urine. What increases the risk? This condition is more likely to develop in men over the age of 50 years. What are the signs or symptoms? Symptoms of this condition include: Getting up often during the night to urinate. Needing to urinate frequently during the day. Difficulty starting urine flow. Decrease in size and strength of your urine stream. Leaking (dribbling) after urinating. Inability to pass urine. This needs immediate treatment. Inability to completely empty your bladder. Pain when you pass urine. This is more common if there is also an infection. Urinary tract infection (UTI). How is this diagnosed? This condition is diagnosed based on your medical history, a physical exam, and your symptoms. Tests will also be done, such as: A post-void bladder scan. This measures any amount of urine that may remain in your bladder after you finish urinating. A digital rectal exam. In a rectal exam, your health care provider checks your prostate by  putting a lubricated, gloved finger into your rectum to feel the back of your prostate gland. This exam detects the size of your gland and any abnormal lumps or growths. An exam of your urine (urinalysis). A prostate specific antigen (PSA) screening. This is a blood test used to screen for prostate cancer. An ultrasound. This test uses sound waves to electronically produce a picture of your prostate gland. Your health care provider may refer you to a specialist in kidney and prostate diseases (urologist). How is this treated? Once symptoms begin, your health care provider will monitor your condition (active surveillance or watchful waiting). Treatment for this condition will depend on the severity of your condition. Treatment may include: Observation and yearly exams. This may be the only treatment needed if your condition and symptoms are mild. Medicines to relieve your symptoms, including: Medicines to shrink the prostate. Medicines to relax the muscle of the prostate. Surgery in severe cases. Surgery may include: Prostatectomy. In this procedure, the prostate tissue is removed completely through an open incision or with a laparoscope or robotics. Transurethral resection of the prostate (TURP). In this procedure, a tool is inserted through the opening at the tip of the penis (urethra). It is used to cut away tissue of the inner core of the prostate. The pieces are removed through the same opening of the penis. This removes the blockage. Transurethral incision (TUIP). In this procedure, small cuts are made in the prostate. This lessens the prostate's pressure on the urethra. Transurethral microwave thermotherapy (TUMT). This procedure uses microwaves to create heat. The heat destroys and removes a   small amount of prostate tissue. Transurethral needle ablation (TUNA). This procedure uses radio frequencies to destroy and remove a small amount of prostate tissue. Interstitial laser coagulation (ILC).  This procedure uses a laser to destroy and remove a small amount of prostate tissue. Transurethral electrovaporization (TUVP). This procedure uses electrodes to destroy and remove a small amount of prostate tissue. Prostatic urethral lift. This procedure inserts an implant to push the lobes of the prostate away from the urethra. Follow these instructions at home: Take over-the-counter and prescription medicines only as told by your health care provider. Monitor your symptoms for any changes. Contact your health care provider with any changes. Avoid drinking large amounts of liquid before going to bed or out in public. Avoid or reduce how much caffeine or alcohol you drink. Give yourself time when you urinate. Keep all follow-up visits as told by your health care provider. This is important. Contact a health care provider if: You have unexplained back pain. Your symptoms do not get better with treatment. You develop side effects from the medicine you are taking. Your urine becomes very dark or has a bad smell. Your lower abdomen becomes distended and you have trouble passing your urine. Get help right away if: You have a fever or chills. You suddenly cannot urinate. You feel lightheaded, or very dizzy, or you faint. There are large amounts of blood or clots in the urine. Your urinary problems become hard to manage. You develop moderate to severe low back or flank pain. The flank is the side of your body between the ribs and the hip. These symptoms may represent a serious problem that is an emergency. Do not wait to see if the symptoms will go away. Get medical help right away. Call your local emergency services (911 in the [place] Do not drive yourself to the hospital. Summary Benign prostatic hyperplasia (BPH) is an enlarged prostate that is caused by the normal aging process and not by cancer. An enlarged prostate can press on the urethra. This can make it hard to pass urine. This  condition is part of a normal aging process and is more likely to develop in men over the age of 50 years. Get help right away if you suddenly cannot urinate. This information is not intended to replace advice given to you by your health care provider. Make sure you discuss any questions you have with your health care provider. Document Revised: 10/18/2020 Document Reviewed: 03/16/2020 Elsevier Patient Education  2022 [employer]  

## 2021-04-24 ENCOUNTER — Other Ambulatory Visit: Payer: Self-pay | Admitting: Urology

## 2021-04-24 DIAGNOSIS — N400 Enlarged prostate without lower urinary tract symptoms: Secondary | ICD-10-CM

## 2021-04-25 DIAGNOSIS — E1165 Type 2 diabetes mellitus with hyperglycemia: Secondary | ICD-10-CM | POA: Insufficient documentation

## 2021-04-26 DIAGNOSIS — R972 Elevated prostate specific antigen [PSA]: Secondary | ICD-10-CM | POA: Insufficient documentation

## 2021-05-23 ENCOUNTER — Other Ambulatory Visit: Payer: Self-pay | Admitting: Urology

## 2021-05-23 DIAGNOSIS — N400 Enlarged prostate without lower urinary tract symptoms: Secondary | ICD-10-CM

## 2021-06-20 ENCOUNTER — Other Ambulatory Visit: Payer: Self-pay | Admitting: Urology

## 2021-06-20 DIAGNOSIS — N400 Enlarged prostate without lower urinary tract symptoms: Secondary | ICD-10-CM

## 2021-07-19 ENCOUNTER — Other Ambulatory Visit: Payer: Self-pay | Admitting: Urology

## 2021-07-19 DIAGNOSIS — N400 Enlarged prostate without lower urinary tract symptoms: Secondary | ICD-10-CM

## 2021-08-08 ENCOUNTER — Other Ambulatory Visit: Payer: Self-pay

## 2021-08-08 ENCOUNTER — Encounter (HOSPITAL_COMMUNITY): Payer: Self-pay | Admitting: Physical Therapy

## 2021-08-08 ENCOUNTER — Ambulatory Visit (HOSPITAL_COMMUNITY): Payer: Medicaid Other | Attending: Student | Admitting: Physical Therapy

## 2021-08-08 DIAGNOSIS — M545 Low back pain, unspecified: Secondary | ICD-10-CM | POA: Diagnosis not present

## 2021-08-08 NOTE — Therapy (Signed)
[place], [place], [address] Phone: [phone]   Fax:  [phone]  Physical Therapy Evaluation  Patient Details  Name: Charles Evans MRN: [id number] Date of Birth: 1956/03/15 Referring Provider (PT): Glenford Peers NP   Encounter Date: 08/08/2021   PT End of Session - 08/08/21 1411     Visit Number 1    Number of Visits 12    Date for PT Re-Evaluation 09/19/21    Authorization Type Medicaid [employer] (Eval and first 12 visits approved)    Authorization - Visit Number 1    Authorization - Number of Visits 13    Progress Note Due on Visit 10    PT Start Time 8921    PT Stop Time 1430    PT Time Calculation (min) 45 min    Activity Tolerance Patient tolerated treatment well    Behavior During Therapy [name] for tasks assessed/performed             Past Medical History:  Diagnosis Date   Hypertension     Past Surgical History:  Procedure Laterality Date   BACK SURGERY     COLONOSCOPY N/A 08/21/2015   Procedure: COLONOSCOPY;  Surgeon: Danie Binder, MD;  [facility];  Service: Endoscopy;  Laterality: N/A;  1430   ESOPHAGOGASTRODUODENOSCOPY N/A 08/21/2015   Procedure: ESOPHAGOGASTRODUODENOSCOPY (EGD);  Surgeon: Danie Binder, MD;  Location: [facility];  Service: Endoscopy;  Laterality: N/A;   HERNIA REPAIR     left groin   LUMBAR LAMINECTOMY/DECOMPRESSION MICRODISCECTOMY Left 09/28/2018   Procedure: Microdiscectomy - L3-L4 - left;  Surgeon: Kary Kos, MD;  Location: [place];  Service: Neurosurgery;  Laterality: Left;  Microdiscectomy - L3-L4 - left    There were no vitals filed for this visit.    Subjective Assessment - 08/08/21 1352     Subjective Patient presents to therapy with complaint of low back pain. He reports chronic history of back pain and has had 2 lumbar surgeries to date. Pain increases with prolonged positions (sitting and standing) and with walking. He is scheduled  for MRI but must complete course of physical therapy prior. He does note newer onset of LLE weakness, and occasional giving out when ambulating stairs. He uses a straight cane PRN for assistance with ambulating. He has had injections but benefits have been temporary (about 5 days relief)    Pertinent History Lumbar surgery (fusions) x 2    Limitations Sitting;House hold activities;Standing;Walking    How long can you sit comfortably? 1 hour    How long can you stand comfortably? 30 minutes    How long can you walk comfortably? 30 minutes    Patient Stated Goals Decrease pain    Currently in Pain? Yes    Pain Score 5     Pain Location Back    Pain Orientation Posterior;Lower    Pain Descriptors / Indicators Tightness    Pain Type Chronic pain    Pain Onset More than a month ago    Pain Frequency Constant    Aggravating Factors  prolonged sitting, standing, walking    Pain Relieving Factors Meds, heat (bath)    Effect of Pain on Daily Activities Limits                [facility] PT Assessment - 08/08/21 0001       Assessment   Medical Diagnosis Lumbago    Referring Provider (PT) Glenford Peers NP  Onset Date/Surgical Date --   Chronic   Next MD Visit None scheduled    Prior Therapy Yes      Precautions   Precautions None      Restrictions   Weight Bearing Restrictions No      Balance Screen   Has the patient fallen in the past 6 months No      Prior Function   Level of Independence Independent      Cognition   Overall Cognitive Status Within Functional Limits for tasks assessed      Observation/Other Assessments   Focus on Therapeutic Outcomes (FOTO)  NA      Posture/Postural Control   Posture/Postural Control Postural limitations    Posture Comments Slouched seated posture, posterior lean      ROM / Strength   AROM / PROM / Strength AROM;Strength      AROM   AROM Assessment Site Lumbar    Lumbar Flexion WNL    Lumbar Extension 80% limited    Lumbar - Right  Side Bend WNL    Lumbar - Left Side Bend 30% limited   increased pain LT lumbar     Strength   Strength Assessment Site Hip;Knee;Ankle    Right/Left Hip Right;Left    Right Hip Flexion 5/5    Right Hip Extension 4/5    Right Hip ABduction 4/5    Left Hip Flexion 4/5    Left Hip Extension 3-/5    Left Hip ABduction 3+/5    Right/Left Knee Right;Left    Right Knee Extension 5/5    Left Knee Extension 4/5    Right/Left Ankle Right;Left    Right Ankle Dorsiflexion 5/5    Left Ankle Dorsiflexion 4+/5      Palpation   Palpation comment Mod tenderness to palpation about bilateral lumbar parapsinals (L2-5) LT> RT                        Objective measurements completed on examination: See above findings.       [facility] Adult PT Treatment/Exercise - 08/08/21 0001       Exercises   Exercises Lumbar      Lumbar Exercises: Supine   Ab Set 5 seconds    Bent Knee Raise 10 reps    Bridge 5 reps                     PT Education - 08/08/21 1355     Education Details on evaluation findings, POC and HEP    Person(s) Educated Patient    Methods Explanation;Handout    Comprehension Verbalized understanding              PT Short Term Goals - 08/08/21 1417       PT SHORT TERM GOAL #1   Title Patient will be independent with initial HEP and self-management strategies to improve functional outcomes    Time 3    Period Weeks    Status New    Target Date 08/29/21               PT Long Term Goals - 08/08/21 1417       PT LONG TERM GOAL #1   Title Patient will be independent with advanced HEP and self-management strategies to improve functional outcomes    Time 6    Period Weeks    Status New    Target Date 09/19/21      PT LONG  TERM GOAL #2   Title Patient will report at least 65% overall improvement in subjective complaint to indicate improvement in ability to perform ADLs.    Time 6    Period Weeks    Status New    Target Date 09/19/21       PT LONG TERM GOAL #3   Title Patient will have equal to or > 4+/5 MMT throughout BLE to improve ability to perform functional mobility, stair ambulation and ADLs.    Time 6    Period Weeks    Status New    Target Date 09/19/21      PT LONG TERM GOAL #4   Title Patient will be able to stand > 45 minutes with pain not to exceed 3/10 in lumbar for improved ADLs and functional mobility    Time 6    Period Weeks    Status New    Target Date 09/19/21                    Plan - 08/08/21 1413     Clinical Impression Statement Patient is a 66 y.o. male who presents to physical therapy with complaint of Chronic LBP. Patient demonstrates decreased strength, ROM restriction, increased tenderness to palpation and postural abnormalities which are likely contributing to symptoms of pain and are negatively impacting patient ability to perform ADLs and functional mobility tasks. Patient will benefit from skilled physical therapy services to address these deficits to reduce pain, improve level of function with ADLs and functional mobility tasks.    Personal Factors and Comorbidities Time since onset of injury/illness/exacerbation    Examination-Activity Limitations Sit;Stairs;Stand;Transfers;Locomotion Level;Lift    Examination-Participation Restrictions Community Activity;Laundry;Cleaning;Shop    Stability/Clinical Decision Making Stable/Uncomplicated    Clinical Decision Making Low    Rehab Potential Good    PT Frequency 2x / week    PT Duration 6 weeks    PT Treatment/Interventions ADLs/Self Care Home Management;Biofeedback;Contrast Bath;DME Instruction;Patient/family education;Scar mobilization;Compression bandaging;Neuromuscular re-education;Ultrasound;Parrafin;Fluidtherapy;Visual/perceptual remediation/compensation;Spinal Manipulations;Passive range of motion;Dry needling;Joint Manipulations;Orthotic Fit/Training;Gait training;Cryotherapy;Stair training;Functional mobility  training;Electrical Stimulation;Iontophoresis 4mg /ml Dexamethasone;Therapeutic activities;Energy conservation;Splinting;Taping;Vasopneumatic Device;Manual techniques;Therapeutic exercise;Moist Heat;Traction;Balance training;Manual lymph drainage;Vestibular    PT Next Visit Plan Review goals and HEP. Progress core strengthening and pain free lumbar mobility as able. Possibly DN, f/u next session    PT Home Exercise Plan Eval: ab brace, ab march, bridge    Consulted and Agree with Plan of Care Patient             Patient will benefit from skilled therapeutic intervention in order to improve the following deficits and impairments:  Decreased activity tolerance, Decreased strength, Increased fascial restricitons, Pain, Decreased mobility, Decreased range of motion, Improper body mechanics, Impaired perceived functional ability, Postural dysfunction  Visit Diagnosis: Low back pain, unspecified back pain laterality, unspecified chronicity, unspecified whether sciatica present     Problem List Patient Active Problem List   Diagnosis Date Noted   HNP (herniated nucleus pulposus), lumbar 09/28/2018   Encounter for screening colonoscopy    Intractable hiccups 08/15/2015   Cough 08/15/2015   Colon cancer screening 08/15/2015   Dyspepsia 08/15/2015   Chest pain 05/29/2012   Atrial fibrillation ([place]) 05/29/2012   HTN (hypertension) 05/29/2012   Hyperglycemia 05/29/2012   Hypokalemia 05/29/2012   2:22 PM, 08/08/21 Josue Hector PT DPT  Physical Therapist with [facility]  [phone] [place], [place], [address] Phone: [phone]  Fax:  [phone]  Name: Charles Evans MRN: [id number] Date of Birth: 1956/07/06

## 2021-08-08 NOTE — Patient Instructions (Addendum)
Trigger Point Dry Needling  What is Trigger Point Dry Needling (DN)? DN is a physical therapy technique used to treat muscle pain and dysfunction. Specifically, DN helps deactivate muscle trigger points (muscle knots).  A thin filiform needle is used to penetrate the skin and stimulate the underlying trigger point. The goal is for a local twitch response (LTR) to occur and for the trigger point to relax. No medication of any kind is injected during the procedure.   What Does Trigger Point Dry Needling Feel Like?  The procedure feels different for each individual patient. Some patients report that they do not actually feel the needle enter the skin and overall the process is not painful. Very mild bleeding may occur. However, many patients feel a deep cramping in the muscle in which the needle was inserted. This is the local twitch response.   How Will I feel after the treatment? Soreness is normal, and the onset of soreness may not occur for a few hours. Typically this soreness does not last longer than two days.  Bruising is uncommon, however; ice can be used to decrease any possible bruising.  In rare cases feeling tired or nauseous after the treatment is normal. In addition, your symptoms may get worse before they get better, this period will typically not last longer than 24 hours.   What Can I do After My Treatment? Increase your hydration by drinking more water for the next 24 hours. You may place ice or heat on the areas treated that have become sore, however, do not use heat on inflamed or bruised areas. Heat often brings more relief post needling. You can continue your regular activities, but vigorous activity is not recommended initially after the treatment for 24 hours. DN is best combined with other physical therapy such as strengthening, stretching, and other therapies.   Access Code: [email or web address] URL: [email or web address] Date: 08/08/2021 Prepared by: Josue Hector  Exercises Supine Bridge - 2-3 x daily - 7 x weekly - 1-2 sets - 10 reps Supine Transversus Abdominis Bracing - Hands on Stomach - 2-3 x daily - 7 x weekly - 2 sets - 10 reps - 5 second hold Supine March - 2-3 x daily - 7 x weekly - 2 sets - 10 reps

## 2021-08-09 DIAGNOSIS — F17200 Nicotine dependence, unspecified, uncomplicated: Secondary | ICD-10-CM | POA: Insufficient documentation

## 2021-08-13 ENCOUNTER — Other Ambulatory Visit: Payer: Self-pay

## 2021-08-13 ENCOUNTER — Encounter (HOSPITAL_COMMUNITY): Payer: Self-pay | Admitting: Physical Therapy

## 2021-08-13 ENCOUNTER — Ambulatory Visit (HOSPITAL_COMMUNITY): Payer: Medicaid Other | Admitting: Physical Therapy

## 2021-08-13 DIAGNOSIS — M545 Low back pain, unspecified: Secondary | ICD-10-CM

## 2021-08-13 NOTE — Therapy (Signed)
[place], [place], [address] Phone: [phone]   Fax:  [phone]  Physical Therapy Treatment  Patient Details  Name: Charles Evans MRN: [id number] Date of Birth: 06-07-1956 Referring Provider (PT): Glenford Peers NP   Encounter Date: 08/13/2021   PT End of Session - 08/13/21 1000     Visit Number 2    Number of Visits 12    Date for PT Re-Evaluation 09/19/21    Authorization Type Medicaid [employer] (Eval and first 12 visits approved)    Authorization - Visit Number 2    Authorization - Number of Visits 13    Progress Note Due on Visit 10    PT Start Time 1000    PT Stop Time 1038    PT Time Calculation (min) 38 min    Activity Tolerance Patient tolerated treatment well    Behavior During Therapy [facility] for tasks assessed/performed             Past Medical History:  Diagnosis Date   Hypertension     Past Surgical History:  Procedure Laterality Date   BACK SURGERY     COLONOSCOPY N/A 08/21/2015   Procedure: COLONOSCOPY;  Surgeon: Danie Binder, MD;  [facility];  Service: Endoscopy;  Laterality: N/A;  1430   ESOPHAGOGASTRODUODENOSCOPY N/A 08/21/2015   Procedure: ESOPHAGOGASTRODUODENOSCOPY (EGD);  Surgeon: Danie Binder, MD;  [facility];  Service: Endoscopy;  Laterality: N/A;   HERNIA REPAIR     left groin   LUMBAR LAMINECTOMY/DECOMPRESSION MICRODISCECTOMY Left 09/28/2018   Procedure: Microdiscectomy - L3-L4 - left;  Surgeon: Kary Kos, MD;  Location: [place];  Service: Neurosurgery;  Laterality: Left;  Microdiscectomy - L3-L4 - left    There were no vitals filed for this visit.   Subjective Assessment - 08/13/21 1001     Subjective Patient states exercises have been helpful. Back is alright.    Pertinent History Lumbar surgery (fusions) x 2    Limitations Sitting;House hold activities;Standing;Walking    How long can you sit comfortably? 1 hour    How long can you  stand comfortably? 30 minutes    How long can you walk comfortably? 30 minutes    Patient Stated Goals Decrease pain    Currently in Pain? Yes    Pain Score 5     Pain Location Back    Pain Orientation Lower    Pain Descriptors / Indicators Aching    Pain Type Chronic pain    Pain Onset More than a month ago    Pain Frequency Constant                               [facility] Adult PT Treatment/Exercise - 08/13/21 0001       Lumbar Exercises: Supine   Ab Set 10 reps;5 seconds    Bent Knee Raise 10 reps    Bent Knee Raise Limitations with ab set    Bridge 10 reps    Bridge Limitations 2 sets    Straight Leg Raise 10 reps    Straight Leg Raises Limitations 2 sets bilateral    Other Supine Lumbar Exercises DKTC with green ball 2x 10 3-5 second holds      Lumbar Exercises: Prone   Straight Leg Raise 10 reps    Straight Leg Raises Limitations 2sets bilateral  PT Education - 08/13/21 1000     Education Details HEP    Person(s) Educated Patient    Methods Explanation;Demonstration    Comprehension Verbalized understanding;Returned demonstration              PT Short Term Goals - 08/08/21 1417       PT SHORT TERM GOAL #1   Title Patient will be independent with initial HEP and self-management strategies to improve functional outcomes    Time 3    Period Weeks    Status New    Target Date 08/29/21               PT Long Term Goals - 08/08/21 1417       PT LONG TERM GOAL #1   Title Patient will be independent with advanced HEP and self-management strategies to improve functional outcomes    Time 6    Period Weeks    Status New    Target Date 09/19/21      PT LONG TERM GOAL #2   Title Patient will report at least 65% overall improvement in subjective complaint to indicate improvement in ability to perform ADLs.    Time 6    Period Weeks    Status New    Target Date 09/19/21      PT LONG TERM GOAL #3    Title Patient will have equal to or > 4+/5 MMT throughout BLE to improve ability to perform functional mobility, stair ambulation and ADLs.    Time 6    Period Weeks    Status New    Target Date 09/19/21      PT LONG TERM GOAL #4   Title Patient will be able to stand > 45 minutes with pain not to exceed 3/10 in lumbar for improved ADLs and functional mobility    Time 6    Period Weeks    Status New    Target Date 09/19/21                   Plan - 08/13/21 1000     Clinical Impression Statement Began session with review of HEP. Educated on core weakness possible contribution to am tightness symptoms. Continued with core and hip strengthening which is tolerated well. Patient given cueing throughout session for TRA activation. Patient educated on probable muscle soreness and completing exercises to reduce. Patient will continue to benefit from physical therapy in order to reduce impairment and improve function.    Personal Factors and Comorbidities Time since onset of injury/illness/exacerbation    Examination-Activity Limitations Sit;Stairs;Stand;Transfers;Locomotion Level;Lift    Examination-Participation Restrictions Community Activity;Laundry;Cleaning;Shop    Stability/Clinical Decision Making Stable/Uncomplicated    Rehab Potential Good    PT Frequency 2x / week    PT Duration 6 weeks    PT Treatment/Interventions ADLs/Self Care Home Management;Biofeedback;Contrast Bath;DME Instruction;Patient/family education;Scar mobilization;Compression bandaging;Neuromuscular re-education;Ultrasound;Parrafin;Fluidtherapy;Visual/perceptual remediation/compensation;Spinal Manipulations;Passive range of motion;Dry needling;Joint Manipulations;Orthotic Fit/Training;Gait training;Cryotherapy;Stair training;Functional mobility training;Electrical Stimulation;Iontophoresis 4mg /ml Dexamethasone;Therapeutic activities;Energy conservation;Splinting;Taping;Vasopneumatic Device;Manual  techniques;Therapeutic exercise;Moist Heat;Traction;Balance training;Manual lymph drainage;Vestibular    PT Next Visit Plan Progress core strengthening and pain free lumbar mobility as able. Possibly DN, f/u next session    PT Home Exercise Plan Eval: ab brace, ab march, bridge 1/23 SLR, prone hip extension    Consulted and Agree with Plan of Care Patient             Patient will benefit from skilled therapeutic intervention in order to improve the following deficits and impairments:  Decreased activity tolerance, Decreased strength, Increased fascial restricitons, Pain, Decreased mobility, Decreased range of motion, Improper body mechanics, Impaired perceived functional ability, Postural dysfunction  Visit Diagnosis: Low back pain, unspecified back pain laterality, unspecified chronicity, unspecified whether sciatica present     Problem List Patient Active Problem List   Diagnosis Date Noted   HNP (herniated nucleus pulposus), lumbar 09/28/2018   Encounter for screening colonoscopy    Intractable hiccups 08/15/2015   Cough 08/15/2015   Colon cancer screening 08/15/2015   Dyspepsia 08/15/2015   Chest pain 05/29/2012   Atrial fibrillation ([place]) 05/29/2012   HTN (hypertension) 05/29/2012   Hyperglycemia 05/29/2012   Hypokalemia 05/29/2012   10:39 AM, 08/13/21 Mearl Latin PT, DPT Physical Therapist at [place], [place], [address] Phone: [phone]   Fax:  [phone]  Name: Charles Evans MRN: [id number] Date of Birth: 06-Aug-1955

## 2021-08-13 NOTE — Patient Instructions (Signed)
Access Code: [email or web address] URL: [email or web address] Date: 08/13/2021 Prepared by: Margie Billet  Exercises Active Straight Leg Raise with Quad Set - 1 x daily - 7 x weekly - 2 sets - 10 reps Prone Hip Extension - 1 x daily - 7 x weekly - 2 sets - 10 reps

## 2021-08-15 ENCOUNTER — Other Ambulatory Visit: Payer: Self-pay

## 2021-08-15 ENCOUNTER — Ambulatory Visit (HOSPITAL_COMMUNITY): Payer: Medicaid Other | Admitting: Physical Therapy

## 2021-08-15 ENCOUNTER — Encounter (HOSPITAL_COMMUNITY): Payer: Self-pay | Admitting: Physical Therapy

## 2021-08-15 DIAGNOSIS — M545 Low back pain, unspecified: Secondary | ICD-10-CM | POA: Diagnosis not present

## 2021-08-15 NOTE — Patient Instructions (Addendum)
Access Code: [email or web address] URL: [email or web address] Date: 08/15/2021 Prepared by: Mitzi Hansen Becker Christopher  Exercises Clamshell - 1 x daily - 7 x weekly - 2 sets - 10 reps Supine Lower Trunk Rotation - 1 x daily - 7 x weekly - 10 reps - 5 second hold

## 2021-08-15 NOTE — Therapy (Signed)
[place], [place], [address] Phone: [phone]   Fax:  [phone]  Physical Therapy Treatment  Patient Details  Name: Charles Evans MRN: [id number] Date of Birth: Feb 23, 1956 Referring Provider (PT): Glenford Peers NP   Encounter Date: 08/15/2021   PT End of Session - 08/15/21 1102     Visit Number 3    Number of Visits 12    Date for PT Re-Evaluation 09/19/21    Authorization Type Medicaid [employer] (Eval and first 12 visits approved)    Authorization - Visit Number 3    Authorization - Number of Visits 13    Progress Note Due on Visit 10    PT Start Time 1103   arrives late/delayed check in   PT Stop Time 1141    PT Time Calculation (min) 38 min    Activity Tolerance Patient tolerated treatment well    Behavior During Therapy [facility] for tasks assessed/performed             Past Medical History:  Diagnosis Date   Hypertension     Past Surgical History:  Procedure Laterality Date   BACK SURGERY     COLONOSCOPY N/A 08/21/2015   Procedure: COLONOSCOPY;  Surgeon: Danie Binder, MD;  [facility];  Service: Endoscopy;  Laterality: N/A;  1430   ESOPHAGOGASTRODUODENOSCOPY N/A 08/21/2015   Procedure: ESOPHAGOGASTRODUODENOSCOPY (EGD);  Surgeon: Danie Binder, MD;  [facility];  Service: Endoscopy;  Laterality: N/A;   HERNIA REPAIR     left groin   LUMBAR LAMINECTOMY/DECOMPRESSION MICRODISCECTOMY Left 09/28/2018   Procedure: Microdiscectomy - L3-L4 - left;  Surgeon: Kary Kos, MD;  Location: [place];  Service: Neurosurgery;  Laterality: Left;  Microdiscectomy - L3-L4 - left    There were no vitals filed for this visit.   Subjective Assessment - 08/15/21 1104     Subjective Patient having soreness. was sore following new exercises. Doing HEP    Pertinent History Lumbar surgery (fusions) x 2    Limitations Sitting;House hold activities;Standing;Walking    How long can you sit  comfortably? 1 hour    How long can you stand comfortably? 30 minutes    How long can you walk comfortably? 30 minutes    Patient Stated Goals Decrease pain    Currently in Pain? Yes    Pain Score 6     Pain Location Back    Pain Orientation Lower    Pain Descriptors / Indicators Sore    Pain Type Chronic pain    Pain Onset More than a month ago                               [facility] Adult PT Treatment/Exercise - 08/15/21 0001       Lumbar Exercises: Supine   Ab Set 10 reps;5 seconds    Bent Knee Raise 10 reps    Bent Knee Raise Limitations with ab set, 2 sets bilateral    Bridge 10 reps    Bridge Limitations 2 sets    Other Supine Lumbar Exercises DKTC with green ball 2x 10 3-5 second holds    Other Supine Lumbar Exercises LTR 10x 5 second holds bilateral      Lumbar Exercises: Sidelying   Clam Both;10 reps    Clam Limitations 2 sets bilateral  PT Education - 08/15/21 1103     Education Details hep    Person(s) Educated Patient    Methods Explanation;Demonstration    Comprehension Verbalized understanding;Returned demonstration              PT Short Term Goals - 08/15/21 1141       PT SHORT TERM GOAL #1   Status On-going               PT Long Term Goals - 08/15/21 1141       PT LONG TERM GOAL #1   Title Patient will be independent with advanced HEP and self-management strategies to improve functional outcomes    Time 6    Period Weeks    Status On-going    Target Date 09/19/21      PT LONG TERM GOAL #2   Title Patient will report at least 65% overall improvement in subjective complaint to indicate improvement in ability to perform ADLs.    Time 6    Period Weeks    Status On-going    Target Date 09/19/21      PT LONG TERM GOAL #3   Title Patient will have equal to or > 4+/5 MMT throughout BLE to improve ability to perform functional mobility, stair ambulation and ADLs.    Time 6    Period  Weeks    Status On-going    Target Date 09/19/21      PT LONG TERM GOAL #4   Title Patient will be able to stand > 45 minutes with pain not to exceed 3/10 in lumbar for improved ADLs and functional mobility    Time 6    Period Weeks    Status On-going    Target Date 09/19/21                   Plan - 08/15/21 1103     Clinical Impression Statement Patient with c/o soreness with all exercises today but decrease in symptoms following. Continued with core and hip strengthening as well as lumbar mobility. Patient will continue to benefit from skilled physical therapy in order to reduce impairment and improve function.    Personal Factors and Comorbidities Time since onset of injury/illness/exacerbation    Examination-Activity Limitations Sit;Stairs;Stand;Transfers;Locomotion Level;Lift    Examination-Participation Restrictions Community Activity;Laundry;Cleaning;Shop    Stability/Clinical Decision Making Stable/Uncomplicated    Rehab Potential Good    PT Frequency 2x / week    PT Duration 6 weeks    PT Treatment/Interventions ADLs/Self Care Home Management;Biofeedback;Contrast Bath;DME Instruction;Patient/family education;Scar mobilization;Compression bandaging;Neuromuscular re-education;Ultrasound;Parrafin;Fluidtherapy;Visual/perceptual remediation/compensation;Spinal Manipulations;Passive range of motion;Dry needling;Joint Manipulations;Orthotic Fit/Training;Gait training;Cryotherapy;Stair training;Functional mobility training;Electrical Stimulation;Iontophoresis 4mg /ml Dexamethasone;Therapeutic activities;Energy conservation;Splinting;Taping;Vasopneumatic Device;Manual techniques;Therapeutic exercise;Moist Heat;Traction;Balance training;Manual lymph drainage;Vestibular    PT Next Visit Plan Progress core strengthening and pain free lumbar mobility as able. Possibly DN, f/u next session    PT Home Exercise Plan Eval: ab brace, ab march, bridge 1/23 SLR, prone hip extension 1/25 LTR,  clam    Consulted and Agree with Plan of Care Patient             Patient will benefit from skilled therapeutic intervention in order to improve the following deficits and impairments:  Decreased activity tolerance, Decreased strength, Increased fascial restricitons, Pain, Decreased mobility, Decreased range of motion, Improper body mechanics, Impaired perceived functional ability, Postural dysfunction  Visit Diagnosis: Low back pain, unspecified back pain laterality, unspecified chronicity, unspecified whether sciatica present     Problem List Patient Active Problem List  Diagnosis Date Noted   HNP (herniated nucleus pulposus), lumbar 09/28/2018   Encounter for screening colonoscopy    Intractable hiccups 08/15/2015   Cough 08/15/2015   Colon cancer screening 08/15/2015   Dyspepsia 08/15/2015   Chest pain 05/29/2012   Atrial fibrillation ([place]) 05/29/2012   HTN (hypertension) 05/29/2012   Hyperglycemia 05/29/2012   Hypokalemia 05/29/2012    11:42 AM, 08/15/21 Mearl Latin PT, DPT Physical Therapist at [place], [place], [address] Phone: [phone]   Fax:  [phone]  Name: Charles Evans MRN: [id number] Date of Birth: 07-29-1955

## 2021-08-16 ENCOUNTER — Other Ambulatory Visit: Payer: Self-pay | Admitting: Physician Assistant

## 2021-08-16 DIAGNOSIS — N400 Enlarged prostate without lower urinary tract symptoms: Secondary | ICD-10-CM

## 2021-08-23 ENCOUNTER — Encounter (HOSPITAL_COMMUNITY): Payer: Medicaid Other | Admitting: Physical Therapy

## 2021-08-29 ENCOUNTER — Ambulatory Visit (HOSPITAL_COMMUNITY): Payer: Medicaid Other | Attending: Student | Admitting: Physical Therapy

## 2021-08-31 ENCOUNTER — Encounter (HOSPITAL_COMMUNITY): Payer: Medicaid Other | Admitting: Physical Therapy

## 2021-09-03 ENCOUNTER — Ambulatory Visit (HOSPITAL_COMMUNITY): Payer: Medicaid Other | Admitting: Physical Therapy

## 2021-09-05 ENCOUNTER — Encounter (HOSPITAL_COMMUNITY): Payer: Medicaid Other | Admitting: Physical Therapy

## 2021-09-06 ENCOUNTER — Ambulatory Visit (HOSPITAL_COMMUNITY): Payer: Medicaid Other | Admitting: Physical Therapy

## 2021-09-12 ENCOUNTER — Other Ambulatory Visit: Payer: Self-pay | Admitting: Physician Assistant

## 2021-09-12 DIAGNOSIS — N400 Enlarged prostate without lower urinary tract symptoms: Secondary | ICD-10-CM

## 2021-09-18 ENCOUNTER — Encounter (HOSPITAL_COMMUNITY): Payer: Medicaid Other | Admitting: Physical Therapy

## 2021-09-20 ENCOUNTER — Ambulatory Visit (HOSPITAL_COMMUNITY): Payer: Medicaid Other | Attending: Student | Admitting: Physical Therapy

## 2021-09-24 ENCOUNTER — Encounter (HOSPITAL_COMMUNITY): Payer: Medicaid Other | Admitting: Physical Therapy

## 2021-09-25 ENCOUNTER — Encounter (HOSPITAL_COMMUNITY): Payer: Medicaid Other | Admitting: Physical Therapy

## 2021-09-27 ENCOUNTER — Ambulatory Visit (HOSPITAL_COMMUNITY): Payer: Medicaid Other | Admitting: Physical Therapy

## 2021-09-27 ENCOUNTER — Encounter (HOSPITAL_COMMUNITY): Payer: Self-pay | Admitting: Physical Therapy

## 2021-09-27 NOTE — Therapy (Signed)
[place] [facility] ?[address] ?[place], [place], [address] ?Phone: [phone]   Fax:  [phone] ? ?Patient Details  ?Name: Charles Evans ?MRN: [id number] ?Date of Birth: 27-Sep-1955 ?Referring Provider:  No ref. provider found ? ?Encounter Date: 09/27/2021 ? ?PHYSICAL THERAPY DISCHARGE SUMMARY ? ?Visits from Start of Care: 3 ? ?Current functional level related to goals / functional outcomes: ?NA ?  ?Remaining deficits: ?NA ?  ?Education / Equipment: ?Patient DC per non return   ? ?Patient agrees to discharge. Patient goals were not met. Patient is being discharged due to not returning since the last visit. ? ?3:53 PM, 09/27/21 ?Josue Hector PT DPT  ?Physical Therapist with [place]  ?[facility]  ?[phone] ? ? ?[place] [facility] ?[address] ?[place], [place], [address] ?Phone: [phone]   Fax:  [phone] ?

## 2021-10-04 ENCOUNTER — Other Ambulatory Visit: Payer: Medicaid Other

## 2021-10-08 ENCOUNTER — Ambulatory Visit: Payer: Medicaid Other | Admitting: Urology

## 2021-10-11 ENCOUNTER — Other Ambulatory Visit: Payer: Medicaid Other

## 2021-10-12 ENCOUNTER — Other Ambulatory Visit: Payer: Self-pay

## 2021-10-12 ENCOUNTER — Ambulatory Visit (INDEPENDENT_AMBULATORY_CARE_PROVIDER_SITE_OTHER): Payer: Medicaid Other | Admitting: Urology

## 2021-10-12 VITALS — BP 123/83 | HR 75

## 2021-10-12 DIAGNOSIS — N400 Enlarged prostate without lower urinary tract symptoms: Secondary | ICD-10-CM

## 2021-10-12 DIAGNOSIS — R351 Nocturia: Secondary | ICD-10-CM | POA: Diagnosis not present

## 2021-10-12 DIAGNOSIS — N5201 Erectile dysfunction due to arterial insufficiency: Secondary | ICD-10-CM

## 2021-10-12 DIAGNOSIS — R972 Elevated prostate specific antigen [PSA]: Secondary | ICD-10-CM | POA: Diagnosis not present

## 2021-10-12 LAB — URINALYSIS, ROUTINE W REFLEX MICROSCOPIC
Bilirubin, UA: NEGATIVE
Ketones, UA: NEGATIVE
Nitrite, UA: NEGATIVE
Protein,UA: NEGATIVE
RBC, UA: NEGATIVE
Specific Gravity, UA: 1.025 (ref 1.005–1.030)
Urobilinogen, Ur: 1 mg/dL (ref 0.2–1.0)
pH, UA: 6.5 (ref 5.0–7.5)

## 2021-10-12 LAB — MICROSCOPIC EXAMINATION
Bacteria, UA: NONE SEEN
RBC, Urine: NONE SEEN /hpf (ref 0–2)
Renal Epithel, UA: NONE SEEN /hpf

## 2021-10-12 MED ORDER — TADALAFIL 20 MG PO TABS: 20.0000 mg | ORAL_TABLET | Freq: Every day | ORAL | 5 refills | Status: DC | PRN

## 2021-10-12 MED ORDER — ALFUZOSIN HCL ER 10 MG PO TB24: 10.0000 mg | ORAL_TABLET | Freq: Every day | ORAL | 3 refills | Status: DC

## 2021-10-12 NOTE — Progress Notes (Signed)
? ?10/12/2021 ?12:07 PM  ? ?Charles Evans ?1955-08-27 ?[id number] ? ?Referring provider: Celene Squibb, MD ?[address] ?[place] ?[place],  [place] [address] ? ?Followup elevated PSA and BPH ? ? ?HPI: ?Mr Derossett is a 66yo here for followup for elevated PSA and BPH IPSS 11 QOL 2 on uroxatral '10mg'$  qhs. Urine stream strong. Nocturia 0x. He has issues getting an erection and he cannot maintain his erection. He has not tried PDE5s. Good libido. Good execise tolerance. No recent PSA ? ? ?PMH: ?Past Medical History:  ?Diagnosis Date  ? Hypertension   ? ? ?Surgical History: ?Past Surgical History:  ?Procedure Laterality Date  ? BACK SURGERY    ? COLONOSCOPY N/A 08/21/2015  ? Procedure: COLONOSCOPY;  Surgeon: Danie Binder, MD;  [facility];  Service: Endoscopy;  Laterality: N/A;  1430  ? ESOPHAGOGASTRODUODENOSCOPY N/A 08/21/2015  ? Procedure: ESOPHAGOGASTRODUODENOSCOPY (EGD);  Surgeon: Danie Binder, MD;  [facility];  Service: Endoscopy;  Laterality: N/A;  ? HERNIA REPAIR    ? left groin  ? LUMBAR LAMINECTOMY/DECOMPRESSION MICRODISCECTOMY Left 09/28/2018  ? Procedure: Microdiscectomy - L3-L4 - left;  Surgeon: Kary Kos, MD;  Location: [place];  Service: Neurosurgery;  Laterality: Left;  Microdiscectomy - L3-L4 - left  ? ? ?Home Medications:  ?Allergies as of 10/12/2021   ? ?   Reactions  ? Decadron [dexamethasone] Other (See Comments)  ? Severe hiccups lasting 15 days  ? Penicillins Swelling, Rash, Other (See Comments)  ? Did it involve swelling of the face/tongue/throat, SOB, or low BP? Yes ?Did it involve sudden or severe rash/hives, skin peeling, or any reaction on the inside of your mouth or nose? No ?Did you need to seek medical attention at a hospital or doctor's office? No ?When did it last happen?      year  ?If all above answers are "NO", may proceed with cephalosporin use.  ? ?  ? ?  ?Medication List  ?  ? ?  ? Accurate as of October 12, 2021 12:07 PM. If you have any questions, ask your nurse or  doctor.  ?  ?  ? ?  ? ?alfuzosin 10 MG 24 hr tablet ?Commonly known as: UROXATRAL ?TAKE 1 TABLET (10 MG TOTAL) BY MOUTH DAILY WITH BREAKFAST. ?  ?amLODipine 10 MG tablet ?Commonly known as: NORVASC ?Take 10 mg by mouth daily. ?  ?HYDROcodone-acetaminophen 7.5-325 MG tablet ?Commonly known as: NORCO ?Take 1 tablet by mouth every 6 (six) hours. ?  ?lisinopril-hydrochlorothiazide 20-25 MG tablet ?Commonly known as: ZESTORETIC ?Take 1 tablet by mouth daily. ?  ?metFORMIN 500 MG tablet ?Commonly known as: GLUCOPHAGE ?Take 500 mg by mouth daily. ?  ? ?  ? ? ?Allergies:  ?Allergies  ?Allergen Reactions  ? Decadron [Dexamethasone] Other (See Comments)  ?  Severe hiccups lasting 15 days  ? Penicillins Swelling, Rash and Other (See Comments)  ?  Did it involve swelling of the face/tongue/throat, SOB, or low BP? Yes ?Did it involve sudden or severe rash/hives, skin peeling, or any reaction on the inside of your mouth or nose? No ?Did you need to seek medical attention at a hospital or doctor's office? No ?When did it last happen?      year  ?If all above answers are "NO", may proceed with cephalosporin use. ? ?  ? ? ?Family History: ?Family History  ?Problem Relation Age of Onset  ? Heart disease Father   ? Colon cancer Cousin   ?  less than 60  ? ? ?Social History:  reports that he has been smoking cigarettes. He has a 20.00 pack-year smoking history. He has never used smokeless tobacco. He reports that he does not drink alcohol and does not use drugs. ? ?ROS: ?All other review of systems were reviewed and are negative except what is noted above in HPI ? ?Physical Exam: ?BP 123/83   Pulse 75   ?Constitutional:  Alert and oriented, No acute distress. ?HEENT: [place] AT, moist mucus membranes.  Trachea midline, no masses. ?Cardiovascular: No clubbing, cyanosis, or edema. ?Respiratory: Normal respiratory effort, no increased work of breathing. ?GI: Abdomen is soft, nontender, nondistended, no abdominal masses ?GU: No CVA  tenderness.  ?Lymph: No cervical or inguinal lymphadenopathy. ?Skin: No rashes, bruises or suspicious lesions. ?Neurologic: Grossly intact, no focal deficits, moving all 4 extremities. ?Psychiatric: Normal mood and affect. ? ?Laboratory Data: ?Lab Results  ?Component Value Date  ? WBC 7.1 01/08/2021  ? HGB 15.5 01/08/2021  ? HCT 46.8 01/08/2021  ? MCV 91.6 01/08/2021  ? PLT 225 01/08/2021  ? ? ?Lab Results  ?Component Value Date  ? CREATININE 1.00 01/08/2021  ? ? ?No results found for: PSA ? ?No results found for: TESTOSTERONE ? ?No results found for: HGBA1C ? ?Urinalysis ?   ?Component Value Date/Time  ? APPEARANCEUR Clear 10/04/2020 1526  ? GLUCOSEU Negative 10/04/2020 1526  ? BILIRUBINUR Negative 10/04/2020 1526  ? PROTEINUR Negative 10/04/2020 1526  ? NITRITE Negative 10/04/2020 1526  ? LEUKOCYTESUR Negative 10/04/2020 1526  ? ? ?Lab Results  ?Component Value Date  ? LABMICR Comment 10/04/2020  ? ? ?Pertinent Imaging: ? ?No results found for this or any previous visit. ? ?No results found for this or any previous visit. ? ?No results found for this or any previous visit. ? ?No results found for this or any previous visit. ? ?No results found for this or any previous visit. ? ?No results found for this or any previous visit. ? ?No results found for this or any previous visit. ? ?No results found for this or any previous visit. ? ? ?Assessment & Plan:   ? ?1. Benign prostatic hyperplasia, unspecified whether lower urinary tract symptoms present ?-continue uroxatral '10mg'$  qhs ?- Urinalysis, Routine w reflex microscopic ? ?2. Elevated PSA ?-PSA today, will call with results ?- Urinalysis, Routine w reflex microscopic ? ?3. Nocturia ?-Continue uroxatral '10mg'$  qhs ? ?4. Erectile dysfunction ?We will trial tadalafil '20mg'$  prn ? ?No follow-ups on file. ? ?Nicolette Bang, MD ? ?[facility] [place] ?  ?

## 2021-10-13 LAB — PSA: Prostate Specific Ag, Serum: 5.5 ng/mL — ABNORMAL HIGH (ref 0.0–4.0)

## 2021-10-17 ENCOUNTER — Other Ambulatory Visit: Payer: Self-pay | Admitting: Physician Assistant

## 2021-10-17 DIAGNOSIS — N400 Enlarged prostate without lower urinary tract symptoms: Secondary | ICD-10-CM

## 2021-10-18 ENCOUNTER — Encounter: Payer: Self-pay | Admitting: Urology

## 2021-10-18 NOTE — Patient Instructions (Signed)
Erectile Dysfunction °Erectile dysfunction (ED) is the inability to get or keep an erection in order to have sexual intercourse. ED is considered a symptom of an underlying disorder and is not considered a disease. ED may include: °Inability to get an erection. °Lack of enough hardness of the erection to allow penetration. °Loss of erection before sex is finished. °What are the causes? °This condition may be caused by: °Physical causes, such as: °Artery problems. This may include heart disease, high blood pressure, atherosclerosis, and diabetes. °Hormonal problems, such as low testosterone. °Obesity. °Nerve problems. This may include back or pelvic injuries, multiple sclerosis, Parkinson's disease, spinal cord injury, and stroke. °Certain medicines, such as: °Pain relievers. °Antidepressants. °Blood pressure medicines and water pills (diuretics). °Cancer medicines. °Antihistamines. °Muscle relaxants. °Lifestyle factors, such as: °Use of drugs such as marijuana, cocaine, or opioids. °Excessive use of alcohol. °Smoking. °Lack of physical activity or exercise. °Psychological causes, such as: °Anxiety or stress. °Sadness or depression. °Exhaustion. °Fear about sexual performance. °Guilt. °What are the signs or symptoms? °Symptoms of this condition include: °Inability to get an erection. °Lack of enough hardness of the erection to allow penetration. °Loss of the erection before sex is finished. °Sometimes having normal erections, but with frequent unsatisfactory episodes. °Low sexual satisfaction in either partner due to erection problems. °A curved penis occurring with erection. The curve may cause pain, or the penis may be too curved to allow for intercourse. °Never having nighttime or morning erections. °How is this diagnosed? °This condition is often diagnosed by: °Performing a physical exam to find other diseases or specific problems with the penis. °Asking you detailed questions about the problem. °Doing tests,  such as: °Blood tests to check for diabetes mellitus or high cholesterol, or to measure hormone levels. °Other tests to check for underlying health conditions. °An ultrasound exam to check for scarring. °A test to check blood flow to the penis. °Doing a sleep study at home to measure nighttime erections. °How is this treated? °This condition may be treated by: °Medicines, such as: °Medicine taken by mouth to help you achieve an erection (oral medicine). °Hormone replacement therapy to replace low testosterone levels. °Medicine that is injected into the penis. Your health care provider may instruct you how to give yourself these injections at home. °Medicine that is delivered with a short applicator tube. The tube is inserted into the opening at the tip of the penis, which is the opening of the urethra. A tiny pellet of medicine is put in the urethra. The pellet dissolves and enhances erectile function. This is also called MUSE (medicated urethral system for erections) therapy. °Vacuum pump. This is a pump with a ring on it. The pump and ring are placed on the penis and used to create pressure that helps the penis become erect. °Penile implant surgery. In this procedure, you may receive: °An inflatable implant. This consists of cylinders, a pump, and a reservoir. The cylinders can be inflated with a fluid that helps to create an erection, and they can be deflated after intercourse. °A semi-rigid implant. This consists of two silicone rubber rods. The rods provide some rigidity. They are also flexible, so the penis can both curve downward in its normal position and become straight for sexual intercourse. °Blood vessel surgery to improve blood flow to the penis. During this procedure, a blood vessel from a different part of the body is placed into the penis to allow blood to flow around (bypass) damaged or blocked blood vessels. °Lifestyle changes,   such as exercising more, losing weight, and quitting smoking. °Follow  these instructions at home: °Medicines ° °Take over-the-counter and prescription medicines only as told by your health care provider. Do not increase the dosage without first discussing it with your health care provider. °If you are using self-injections, do injections as directed by your health care provider. Make sure you avoid any veins that are on the surface of the penis. After giving an injection, apply pressure to the injection site for 5 minutes. °Talk to your health care provider about how to prevent headaches while taking ED medicines. These medicines may cause a sudden headache due to the increase in blood flow in your body. °General instructions °Exercise regularly, as directed by your health care provider. Work with your health care provider to lose weight, if needed. °Do not use any products that contain nicotine or tobacco. These products include cigarettes, chewing tobacco, and vaping devices, such as e-cigarettes. If you need help quitting, ask your health care provider. °Before using a vacuum pump, read the instructions that come with the pump and discuss any questions with your health care provider. °Keep all follow-up visits. This is important. °Contact a health care provider if: °You feel nauseous. °You are vomiting. °You get sudden headaches while taking ED medicines. °You have any concerns about your sexual health. °Get help right away if: °You are taking oral or injectable medicines and you have an erection that lasts longer than 4 hours. If your health care provider is unavailable, go to the nearest emergency room for evaluation. An erection that lasts much longer than 4 hours can result in permanent damage to your penis. °You have severe pain in your groin or abdomen. °You develop redness or severe swelling of your penis. °You have redness spreading at your groin or lower abdomen. °You are unable to urinate. °You experience chest pain or a rapid heartbeat (palpitations) after taking oral  medicines. °These symptoms may represent a serious problem that is an emergency. Do not wait to see if the symptoms will go away. Get medical help right away. Call your local emergency services (911 in the [place] Do not drive yourself to the hospital. °Summary °Erectile dysfunction (ED) is the inability to get or keep an erection during sexual intercourse. °This condition is diagnosed based on a physical exam, your symptoms, and tests to determine the cause. Treatment varies depending on the cause and may include medicines, hormone therapy, surgery, or a vacuum pump. °You may need follow-up visits to make sure that you are using your medicines or devices correctly. °Get help right away if you are taking or injecting medicines and you have an erection that lasts longer than 4 hours. °This information is not intended to replace advice given to you by your health care provider. Make sure you discuss any questions you have with your health care provider. °Document Revised: 10/04/2020 Document Reviewed: 10/04/2020 °Elsevier Patient Education © 2022 Elsevier Inc. ° °

## 2021-10-23 DIAGNOSIS — D72829 Elevated white blood cell count, unspecified: Secondary | ICD-10-CM | POA: Insufficient documentation

## 2021-10-23 DIAGNOSIS — F17200 Nicotine dependence, unspecified, uncomplicated: Secondary | ICD-10-CM | POA: Insufficient documentation

## 2021-10-25 ENCOUNTER — Other Ambulatory Visit: Payer: Self-pay | Admitting: Family Medicine

## 2021-10-25 ENCOUNTER — Other Ambulatory Visit (HOSPITAL_COMMUNITY): Payer: Self-pay | Admitting: Family Medicine

## 2021-10-25 DIAGNOSIS — F17200 Nicotine dependence, unspecified, uncomplicated: Secondary | ICD-10-CM

## 2021-10-25 DIAGNOSIS — H6123 Impacted cerumen, bilateral: Secondary | ICD-10-CM | POA: Insufficient documentation

## 2021-11-14 ENCOUNTER — Other Ambulatory Visit: Payer: Self-pay | Admitting: Physician Assistant

## 2021-11-14 DIAGNOSIS — N400 Enlarged prostate without lower urinary tract symptoms: Secondary | ICD-10-CM

## 2021-12-03 ENCOUNTER — Ambulatory Visit (HOSPITAL_COMMUNITY): Payer: Medicaid Other

## 2021-12-18 ENCOUNTER — Other Ambulatory Visit: Payer: Self-pay | Admitting: Physician Assistant

## 2021-12-18 DIAGNOSIS — N400 Enlarged prostate without lower urinary tract symptoms: Secondary | ICD-10-CM

## 2022-01-09 ENCOUNTER — Ambulatory Visit (HOSPITAL_COMMUNITY)
Admission: RE | Admit: 2022-01-09 | Discharge: 2022-01-09 | Disposition: A | Discharge status: Home / Self Care | Payer: Medicaid Other | Source: Ambulatory Visit | Attending: Family Medicine | Admitting: Family Medicine

## 2022-01-09 DIAGNOSIS — F17200 Nicotine dependence, unspecified, uncomplicated: Secondary | ICD-10-CM | POA: Insufficient documentation

## 2022-01-11 ENCOUNTER — Other Ambulatory Visit: Payer: Self-pay | Admitting: Nurse Practitioner

## 2022-01-11 ENCOUNTER — Other Ambulatory Visit (HOSPITAL_COMMUNITY): Payer: Self-pay | Admitting: Nurse Practitioner

## 2022-01-11 DIAGNOSIS — R911 Solitary pulmonary nodule: Secondary | ICD-10-CM | POA: Insufficient documentation

## 2022-01-11 DIAGNOSIS — R918 Other nonspecific abnormal finding of lung field: Secondary | ICD-10-CM

## 2022-03-08 ENCOUNTER — Other Ambulatory Visit: Payer: Self-pay

## 2022-03-08 DIAGNOSIS — N5201 Erectile dysfunction due to arterial insufficiency: Secondary | ICD-10-CM

## 2022-03-08 MED ORDER — TADALAFIL 20 MG PO TABS: 20.0000 mg | ORAL_TABLET | Freq: Every day | ORAL | 5 refills | Status: DC | PRN

## 2022-04-05 ENCOUNTER — Telehealth: Payer: Self-pay

## 2022-04-05 ENCOUNTER — Other Ambulatory Visit: Payer: Medicaid Other

## 2022-04-05 ENCOUNTER — Other Ambulatory Visit: Payer: Self-pay

## 2022-04-05 DIAGNOSIS — R918 Other nonspecific abnormal finding of lung field: Secondary | ICD-10-CM

## 2022-04-05 DIAGNOSIS — N5201 Erectile dysfunction due to arterial insufficiency: Secondary | ICD-10-CM

## 2022-04-05 DIAGNOSIS — R972 Elevated prostate specific antigen [PSA]: Secondary | ICD-10-CM

## 2022-04-05 MED ORDER — TADALAFIL 20 MG PO TABS: 20.0000 mg | ORAL_TABLET | Freq: Every day | ORAL | 5 refills | Status: DC | PRN

## 2022-04-05 NOTE — Progress Notes (Signed)
Patient requested cialis rx be sent to [facility], resent rx.

## 2022-04-05 NOTE — Telephone Encounter (Signed)
Patient needing a refill on  tadalafil (CIALIS) 20 MG tablet  [id number] [place] [place]  [phone] Fax:  [phone]    Thanks,  THelene Kelp

## 2022-04-05 NOTE — Telephone Encounter (Signed)
Res[facility] to [facility] per patient request.

## 2022-04-06 LAB — PSA: Prostate Specific Ag, Serum: 8 ng/mL — ABNORMAL HIGH (ref 0.0–4.0)

## 2022-04-08 ENCOUNTER — Ambulatory Visit (HOSPITAL_COMMUNITY): Payer: Medicaid Other

## 2022-04-08 ENCOUNTER — Encounter (HOSPITAL_COMMUNITY): Payer: Self-pay

## 2022-04-09 NOTE — Progress Notes (Signed)
Letter sent.

## 2022-04-12 ENCOUNTER — Ambulatory Visit: Payer: Medicaid Other | Admitting: Urology

## 2022-04-15 ENCOUNTER — Telehealth: Payer: Self-pay

## 2022-04-15 ENCOUNTER — Ambulatory Visit (INDEPENDENT_AMBULATORY_CARE_PROVIDER_SITE_OTHER): Payer: Medicaid Other | Admitting: Urology

## 2022-04-15 ENCOUNTER — Encounter: Payer: Self-pay | Admitting: Urology

## 2022-04-15 VITALS — BP 143/79 | HR 79 | Ht 76.0 in | Wt 205.0 lb

## 2022-04-15 DIAGNOSIS — R972 Elevated prostate specific antigen [PSA]: Secondary | ICD-10-CM

## 2022-04-15 DIAGNOSIS — N400 Enlarged prostate without lower urinary tract symptoms: Secondary | ICD-10-CM

## 2022-04-15 DIAGNOSIS — R351 Nocturia: Secondary | ICD-10-CM | POA: Diagnosis not present

## 2022-04-15 DIAGNOSIS — N5201 Erectile dysfunction due to arterial insufficiency: Secondary | ICD-10-CM

## 2022-04-15 DIAGNOSIS — N401 Enlarged prostate with lower urinary tract symptoms: Secondary | ICD-10-CM

## 2022-04-15 MED ORDER — LEVOFLOXACIN 750 MG PO TABS: 750.0000 mg | ORAL_TABLET | Freq: Once | ORAL | 0 refills | Status: AC

## 2022-04-15 MED ORDER — TADALAFIL 20 MG PO TABS: 20.0000 mg | ORAL_TABLET | Freq: Every day | ORAL | 5 refills | Status: DC | PRN

## 2022-04-15 MED ORDER — ALFUZOSIN HCL ER 10 MG PO TB24: 10.0000 mg | ORAL_TABLET | Freq: Every day | ORAL | 3 refills | Status: DC

## 2022-04-15 NOTE — Telephone Encounter (Signed)
During visit patient stated that his cialis rx refill was not covered.  I informed him that the rx needed a PA renewal.  PA completed and sent to plan today, waiting for approval.

## 2022-04-15 NOTE — Patient Instructions (Addendum)
Transrectal Ultrasound-Guided Prostate Biopsy A transrectal ultrasound-guided prostate biopsy is a procedure to remove samples of prostate tissue for testing. The prostate is a walnut-sized gland that is located below the bladder and in front of the rectum. During this procedure, a small device (probe) is lubricated and put inside the rectum. The probe sends out sound waves that make a picture of the prostate and surrounding tissues (transrectal ultrasound). The images are used to help guide the process of removing the samples. The samples are taken to a lab to be checked for prostate cancer. This procedure is usually done to evaluate the prostate gland of men who have raised (elevated) levels of prostate-specific antigen (PSA), which can be a sign of prostate cancer or prostate enlargement related to aging (benign prostatic hyperplasia, or BPH). Tell a health care provider about: Any allergies you have. All medicines you are taking, including vitamins, herbs, eye drops, creams, and over-the-counter medicines. Any problems you or family members have had with anesthetic medicines. Any bleeding problems you have. Any surgeries you have had. Any medical conditions you have. Any prostate infections you have had. What are the risks? Generally, this is a safe procedure. However, problems may occur, including: Prostate infection. Bleeding from the rectum. Blood in the urine. Allergic reactions to medicines. Damage to surrounding structures such as blood vessels, organs, or muscles. Difficulty passing urine. Nerve damage. This is usually temporary. What happens before the procedure? Medicines Ask your health care provider about: Changing or stopping your regular medicines. This is especially important if you are taking diabetes medicines or blood thinners. Taking medicines such as aspirin and ibuprofen. These medicines can thin your blood. Do not take these medicines unless your health care provider  tells you to take them. Taking over-the-counter medicines, vitamins, herbs, and supplements. General instructions Follow instructions from your health care provider about eating and drinking. In most instances, you will not need to stop eating and drinking completely before the procedure. You will be given an enema. During an enema, a liquid is injected into your rectum to clear out waste. You may have a blood or urine sample taken. Ask your health care provider what steps will be taken to help prevent infection. These steps may include: Washing skin with a germ-killing soap. Taking antibiotic medicine. If you will be going home right after the procedure, plan to have a responsible adult: Take you home from the hospital or clinic. You will not be allowed to drive. Care for you for the time you are told. What happens during the procedure?  An IV will be inserted into one of your veins. You will be given one or both of the following: A medicine to help you relax (sedative). A medicine to numb the area (local anesthetic). You will be placed on your left side, and your knees will be bent toward your chest. A probe with lubricated gel will be placed into your rectum, and images will be taken of your prostate and surrounding structures. Numbing medicine will be injected into your prostate. A biopsy needle will be inserted through your rectum or perineum and guided to your prostate using the ultrasound images. Prostate tissue samples will be removed, and the needle and probe will then be removed. The biopsy samples will be sent to a lab to be tested. The procedure may vary among health care providers and hospitals. What happens after the procedure? Your blood pressure, heart rate, breathing rate, and blood oxygen level will be monitored until you leave  the hospital or clinic. You may have some discomfort in the rectal area. You will be given pain medicine as needed. If you were given a sedative  during the procedure, it can affect you for several hours. Do not drive or operate machinery until your health care provider says that it is safe. It is up to you to get the results of your procedure. Ask your health care provider, or the department that is doing the procedure, when your results will be ready. Keep all follow-up visits. This is important. Summary A transrectal ultrasound-guided biopsy removes samples of tissue from your prostate using ultrasound-guided sound waves to help guide the process. This procedure is usually done to evaluate the prostate gland of men who have raised (elevated) levels of prostate-specific antigen (PSA), which can be a sign of prostate cancer or prostate enlargement related to aging. After your procedure, you may feel some discomfort in the rectal area. Plan to have a responsible adult take you home from the hospital or clinic, and follow up with your health care provider for your results. This information is not intended to replace advice given to you by your health care provider. Make sure you discuss any questions you have with your health care provider. Document Revised: 01/01/2021 Document Reviewed: 01/01/2021 Elsevier Patient Education  [place].     Appointment Time: 12:15 Appointment Date:05/08/22  Location: [facility]   Prostate Biopsy Instructions  Stop all aspirin or blood thinners (aspirin, plavix, coumadin, warfarin, motrin, ibuprofen, advil, aleve, naproxen, naprosyn) for 7 days prior to the procedure.  If you have any questions about stopping these medications, please contact your primary care physician or cardiologist.  Having a light meal prior to the procedure is recommended.  If you are diabetic or have low blood sugar please bring a small snack or glucose tablet.  A Fleets enema is needed to be purchased over the counter at a local pharmacy and used 2 hours before you scheduled appointment.  This can  be purchased over the counter at any pharmacy.  Antibiotics will be administered in the clinic at the time of the procedure and 1 tablet has been sent to your pharmacy. Please take the antibiotic as prescribed.    Please bring someone with you to the procedure to drive you home if you are given a valium to take prior to your procedure.   If you have any questions or concerns, please feel free to call the office at [phone] or send a Mychart message.    Thank you, [facility]

## 2022-04-15 NOTE — Progress Notes (Unsigned)
04/15/2022 12:03 PM   Charles Evans 1956-02-23 [id number]  Referring provider: Celene Squibb, MD [address],  [place] [address]  Followup elevated PSA, BPH, and erectile dysfunction   HPI: Mr Charles Evans is a 66yo here for followup for elevated PSA, BPH and erectile dysfunction. PSA increased to 8.0 from 5.5. IPSS 9 QOL 1 on uroxatral '10mg'$ . He has stable ED on tadalafil '20mg'$  prn.    PMH: Past Medical History:  Diagnosis Date   Hypertension     Surgical History: Past Surgical History:  Procedure Laterality Date   BACK SURGERY     COLONOSCOPY N/A 08/21/2015   Procedure: COLONOSCOPY;  Surgeon: Danie Binder, MD;  Location: [facility];  Service: Endoscopy;  Laterality: N/A;  1430   ESOPHAGOGASTRODUODENOSCOPY N/A 08/21/2015   Procedure: ESOPHAGOGASTRODUODENOSCOPY (EGD);  Surgeon: Danie Binder, MD;  Location: [facility];  Service: Endoscopy;  Laterality: N/A;   HERNIA REPAIR     left groin   LUMBAR LAMINECTOMY/DECOMPRESSION MICRODISCECTOMY Left 09/28/2018   Procedure: Microdiscectomy - L3-L4 - left;  Surgeon: Kary Kos, MD;  Location: [facility];  Service: Neurosurgery;  Laterality: Left;  Microdiscectomy - L3-L4 - left    Home Medications:  Allergies as of 04/15/2022       Reactions   Decadron [dexamethasone] Other (See Comments)   Severe hiccups lasting 15 days   Penicillins Swelling, Rash, Other (See Comments)   Did it involve swelling of the face/tongue/throat, SOB, or low BP? Yes Did it involve sudden or severe rash/hives, skin peeling, or any reaction on the inside of your mouth or nose? No Did you need to seek medical attention at a hospital or doctor's office? No When did it last happen?      year  If all above answers are "NO", may proceed with cephalosporin use.        Medication List        Accurate as of April 15, 2022 12:03 PM. If you have any questions, ask your nurse or doctor.          alfuzosin 10 MG 24 hr tablet Commonly known  as: UROXATRAL TAKE 1 TABLET (10 MG TOTAL) BY MOUTH DAILY WITH BREAKFAST.   amLODipine 10 MG tablet Commonly known as: NORVASC Take 10 mg by mouth daily.   HYDROcodone-acetaminophen 7.5-325 MG tablet Commonly known as: NORCO Take 1 tablet by mouth every 6 (six) hours.   lisinopril-hydrochlorothiazide 20-25 MG tablet Commonly known as: ZESTORETIC Take 1 tablet by mouth daily.   metFORMIN 500 MG tablet Commonly known as: GLUCOPHAGE Take 500 mg by mouth daily.   tadalafil 20 MG tablet Commonly known as: CIALIS Take 1 tablet (20 mg total) by mouth daily as needed.        Allergies:  Allergies  Allergen Reactions   Decadron [Dexamethasone] Other (See Comments)    Severe hiccups lasting 15 days   Penicillins Swelling, Rash and Other (See Comments)    Did it involve swelling of the face/tongue/throat, SOB, or low BP? Yes Did it involve sudden or severe rash/hives, skin peeling, or any reaction on the inside of your mouth or nose? No Did you need to seek medical attention at a hospital or doctor's office? No When did it last happen?      year  If all above answers are "NO", may proceed with cephalosporin use.      Family History: Family History  Problem Relation Age of Onset   Heart disease  Father    Colon cancer Cousin        less than 66    Social History:  reports that he has been smoking cigarettes. He has a 20.00 pack-year smoking history. He has never used smokeless tobacco. He reports that he does not drink alcohol and does not use drugs.  ROS: All other review of systems were reviewed and are negative except what is noted above in HPI  Physical Exam: BP (!) 143/79   Pulse 79   Ht '6\' 4"'$  (1.93 m)   Wt 205 lb (93 kg)   BMI 24.95 kg/m   Constitutional:  Alert and oriented, No acute distress. HEENT: [place] AT, moist mucus membranes.  Trachea midline, no masses. Cardiovascular: No clubbing, cyanosis, or edema. Respiratory: Normal respiratory effort, no increased  work of breathing. GI: Abdomen is soft, nontender, nondistended, no abdominal masses GU: No CVA tenderness.  Lymph: No cervical or inguinal lymphadenopathy. Skin: No rashes, bruises or suspicious lesions. Neurologic: Grossly intact, no focal deficits, moving all 4 extremities. Psychiatric: Normal mood and affect.  Laboratory Data: Lab Results  Component Value Date   WBC 7.1 01/08/2021   HGB 15.5 01/08/2021   HCT 46.8 01/08/2021   MCV 91.6 01/08/2021   PLT 225 01/08/2021    Lab Results  Component Value Date   CREATININE 1.00 01/08/2021    No results found for: "PSA"  No results found for: "TESTOSTERONE"  No results found for: "HGBA1C"  Urinalysis    Component Value Date/Time   APPEARANCEUR Clear 10/12/2021 1209   GLUCOSEU Trace (A) 10/12/2021 1209   BILIRUBINUR Negative 10/12/2021 1209   PROTEINUR Negative 10/12/2021 1209   NITRITE Negative 10/12/2021 1209   LEUKOCYTESUR Trace (A) 10/12/2021 1209    Lab Results  Component Value Date   LABMICR See below: 10/12/2021   WBCUA 0-5 10/12/2021   LABEPIT 0-10 10/12/2021   MUCUS Present 10/12/2021   BACTERIA None seen 10/12/2021    Pertinent Imaging:  No results found for this or any previous visit.  No results found for this or any previous visit.  No results found for this or any previous visit.  No results found for this or any previous visit.  No results found for this or any previous visit.  No valid procedures specified. No results found for this or any previous visit.  No results found for this or any previous visit.   Assessment & Plan:    1. Erectile dysfunction due to arterial insufficiency -continue tadalafil '20mg'$  prn - Urinalysis, Routine w reflex microscopic  2. Benign prostatic hyperplasia, unspecified whether lower urinary tract symptoms present Continue uroxatral '10mg'$   3. Nocturia -continue uroxatral '10mg'$   4. Elevated PSA The patient and I talked about etiologies of elevated PSA.   We discussed the possible relationship between elevated PSA, prostate cancer, BPH, prostatitis, and UTI.   Conservative treatment of elevated PSA with watchful waiting was discussed with the patient.  All questions were answered.        All of the risks and benefits along with alternatives to prostate biopsy were discussed with the patient.  The patient gave fully informed consent to proceed with a transrectal ultrasound guided biopsy of the prostate for the evaluation of their evated PSA.  Prostate biopsy instructions and antibiotics were given to the patient.    No follow-ups on file.  Nicolette Bang, MD  [facility] [place]

## 2022-04-16 LAB — URINALYSIS, ROUTINE W REFLEX MICROSCOPIC
Bilirubin, UA: NEGATIVE
Glucose, UA: NEGATIVE
Ketones, UA: NEGATIVE
Leukocytes,UA: NEGATIVE
Nitrite, UA: NEGATIVE
Protein,UA: NEGATIVE
RBC, UA: NEGATIVE
Specific Gravity, UA: 1.015 (ref 1.005–1.030)
Urobilinogen, Ur: 0.2 mg/dL (ref 0.2–1.0)
pH, UA: 7 (ref 5.0–7.5)

## 2022-04-17 ENCOUNTER — Telehealth: Payer: Self-pay

## 2022-04-17 NOTE — Telephone Encounter (Deleted)
[id number] name Pilot Prindle. Joslyn                                                                                                                                                                    DOB 03-14-1956                                                                                                                                                      Date: April 17, 2022  Subject: Coverage for Emani Morad. [facility]   Dear [employer] [place] [facility],  I am writing on behalf of Imad Shostak to appeal the denial of coverage for Tadalafil oral tablet 20 MG. In a letter dated April 15, 2022 [employer] [place] stated that Tadalafil Oral Tablet 20 MG is not covered because [[place]. I have reviewed your letter, and based on my medical expertise, ask that you reconsider this decision. Based on a clinical assessment of my patient, I believe Tadalafil is medically necessary for my patient and request that you reconsider this coverage decision.   I have enclosed additional documentation that further supports treatment with Tadalafil and should address the concerns laid out in the denial letter. Based upon the additional documentation submitted, I ask that you consider reversing the previous denial of Tadalafil. I can be reached at [phone] if additional information is required for approval of this request.   Thank you for your attention to this very important matter.  Sincerely,   Darcella Gasman, [facility] [address] [place], [place] [address]  Enclosures  Original denial letter Relevant medical records

## 2022-04-17 NOTE — Telephone Encounter (Signed)
Working on appeal for patient.

## 2022-05-06 NOTE — Progress Notes (Unsigned)
   Appointment Time: Appointment Date:  Location: [facility]   Prostate Biopsy Instructions  Stop all aspirin or blood thinners (aspirin, plavix, coumadin, warfarin, motrin, ibuprofen, advil, aleve, naproxen, naprosyn) for 7 days prior to the procedure.  If you have any questions about stopping these medications, please contact your primary care physician or cardiologist.  Having a light meal prior to the procedure is recommended.  If you are diabetic or have low blood sugar please bring a small snack or glucose tablet.  A Fleets enema is needed to be purchased over the counter at a local pharmacy and used 2 hours before you scheduled appointment.  This can be purchased over the counter at any pharmacy.  Antibiotics will be administered in the clinic at the time of the procedure and 1 tablet has been sent to your pharmacy. Please take the antibiotic as prescribed.    Please bring someone with you to the procedure to drive you home if you are given a valium to take prior to your procedure.   If you have any questions or concerns, please feel free to call the office at [phone] or send a Mychart message.    Thank you, [facility]

## 2022-05-08 ENCOUNTER — Other Ambulatory Visit: Payer: Self-pay | Admitting: Urology

## 2022-05-08 ENCOUNTER — Encounter (HOSPITAL_COMMUNITY): Payer: Self-pay

## 2022-05-08 ENCOUNTER — Ambulatory Visit (HOSPITAL_BASED_OUTPATIENT_CLINIC_OR_DEPARTMENT_OTHER): Payer: Medicaid Other | Admitting: Urology

## 2022-05-08 ENCOUNTER — Other Ambulatory Visit: Payer: Self-pay

## 2022-05-08 ENCOUNTER — Ambulatory Visit (HOSPITAL_COMMUNITY)
Admission: RE | Admit: 2022-05-08 | Discharge: 2022-05-08 | Disposition: A | Discharge status: Home / Self Care | Payer: Medicaid Other | Source: Ambulatory Visit | Attending: Urology | Admitting: Urology

## 2022-05-08 ENCOUNTER — Encounter: Payer: Self-pay | Admitting: Urology

## 2022-05-08 DIAGNOSIS — C61 Malignant neoplasm of prostate: Secondary | ICD-10-CM

## 2022-05-08 DIAGNOSIS — R972 Elevated prostate specific antigen [PSA]: Secondary | ICD-10-CM | POA: Diagnosis not present

## 2022-05-08 MED ORDER — GENTAMICIN SULFATE 40 MG/ML IJ SOLN: 80.0000 mg | Freq: Once | INTRAMUSCULAR | Status: AC

## 2022-05-08 MED ORDER — GENTAMICIN SULFATE 40 MG/ML IJ SOLN
INTRAMUSCULAR | Status: AC
  Administered 2022-05-08: 80 mg via INTRAMUSCULAR
  Filled 2022-05-08: qty 2

## 2022-05-08 MED ORDER — LEVOFLOXACIN 750 MG PO TABS: 750.0000 mg | ORAL_TABLET | Freq: Once | ORAL | 0 refills | Status: AC

## 2022-05-08 MED ORDER — LIDOCAINE HCL (PF) 2 % IJ SOLN
INTRAMUSCULAR | Status: AC
  Administered 2022-05-08: 10 mL
  Filled 2022-05-08: qty 10

## 2022-05-08 MED ORDER — LIDOCAINE HCL (PF) 2 % IJ SOLN: 10.0000 mL | Freq: Once | INTRAMUSCULAR | Status: AC

## 2022-05-08 NOTE — Progress Notes (Signed)
Patient's wife called to request the antibiotic prior to prostate biopsy.  Previous rx expired, resent to [facility] and wife informed.

## 2022-05-08 NOTE — Progress Notes (Signed)
PT tolerated prostate biopsy procedure and antibiotic injection well today. Labs obtained and sent for pathology. PT ambulatory at discharge with no acute distress noted and verbalized understanding of discharge instructions. PT to follow up with urologist as scheduled on 05/17/22 @ 12:40 pm.

## 2022-05-08 NOTE — Progress Notes (Signed)
Prostate Biopsy Procedure   Informed consent was obtained after discussing risks/benefits of the procedure.  A time out was performed to ensure correct patient identity.  Pre-Procedure: - Last PSA Level: No results found for: "PSA" - Gentamicin given prophylactically - Levaquin 500 mg administered PO -Transrectal Ultrasound performed revealing a 70.2 gm prostate -No significant hypoechoic or median lobe noted  Procedure: - Prostate block performed using 10 cc 1% lidocaine and biopsies taken from sextant areas, a total of 12 under ultrasound guidance.  Post-Procedure: - Patient tolerated the procedure well - He was counseled to seek immediate medical attention if experiences any severe pain, significant bleeding, or fevers - Return in one week to discuss biopsy results

## 2022-05-17 ENCOUNTER — Ambulatory Visit: Payer: Medicaid Other | Admitting: Urology

## 2022-05-17 ENCOUNTER — Telehealth: Payer: Self-pay

## 2022-05-17 NOTE — Telephone Encounter (Signed)
Called patient and left a vm advising him to contact our office to reschedule Biopsy Talk.

## 2022-06-24 ENCOUNTER — Emergency Department (HOSPITAL_COMMUNITY): Payer: Medicare Other

## 2022-06-24 ENCOUNTER — Emergency Department (HOSPITAL_COMMUNITY): Payer: Medicare Other | Admitting: Certified Registered Nurse Anesthetist

## 2022-06-24 ENCOUNTER — Inpatient Hospital Stay (HOSPITAL_COMMUNITY)
Admission: EM | Admit: 2022-06-24 | Discharge: 2022-06-27 | DRG: 481 | Disposition: A | Discharge status: Home / Self Care | Payer: Medicare Other | Attending: Student | Admitting: Student

## 2022-06-24 ENCOUNTER — Inpatient Hospital Stay (HOSPITAL_COMMUNITY): Payer: Medicare Other

## 2022-06-24 ENCOUNTER — Encounter (HOSPITAL_COMMUNITY)
Admission: EM | Disposition: A | Discharge status: Home / Self Care | Payer: Self-pay | Source: Home / Self Care | Attending: Student

## 2022-06-24 ENCOUNTER — Encounter (HOSPITAL_COMMUNITY): Payer: Self-pay | Admitting: Certified Registered Nurse Anesthetist

## 2022-06-24 ENCOUNTER — Other Ambulatory Visit: Payer: Self-pay

## 2022-06-24 DIAGNOSIS — S72001A Fracture of unspecified part of neck of right femur, initial encounter for closed fracture: Secondary | ICD-10-CM | POA: Diagnosis not present

## 2022-06-24 DIAGNOSIS — S72351A Displaced comminuted fracture of shaft of right femur, initial encounter for closed fracture: Principal | ICD-10-CM | POA: Diagnosis present

## 2022-06-24 DIAGNOSIS — Z800 Family history of malignant neoplasm of digestive organs: Secondary | ICD-10-CM | POA: Diagnosis not present

## 2022-06-24 DIAGNOSIS — I10 Essential (primary) hypertension: Secondary | ICD-10-CM | POA: Diagnosis present

## 2022-06-24 DIAGNOSIS — D62 Acute posthemorrhagic anemia: Secondary | ICD-10-CM | POA: Diagnosis not present

## 2022-06-24 DIAGNOSIS — Z79899 Other long term (current) drug therapy: Secondary | ICD-10-CM

## 2022-06-24 DIAGNOSIS — S7291XA Unspecified fracture of right femur, initial encounter for closed fracture: Secondary | ICD-10-CM | POA: Diagnosis present

## 2022-06-24 DIAGNOSIS — Z888 Allergy status to other drugs, medicaments and biological substances status: Secondary | ICD-10-CM | POA: Diagnosis not present

## 2022-06-24 DIAGNOSIS — Z880 Allergy status to penicillin: Secondary | ICD-10-CM | POA: Diagnosis not present

## 2022-06-24 DIAGNOSIS — N400 Enlarged prostate without lower urinary tract symptoms: Secondary | ICD-10-CM | POA: Diagnosis present

## 2022-06-24 DIAGNOSIS — Z23 Encounter for immunization: Secondary | ICD-10-CM

## 2022-06-24 DIAGNOSIS — Z8249 Family history of ischemic heart disease and other diseases of the circulatory system: Secondary | ICD-10-CM | POA: Diagnosis not present

## 2022-06-24 DIAGNOSIS — F17210 Nicotine dependence, cigarettes, uncomplicated: Secondary | ICD-10-CM | POA: Diagnosis present

## 2022-06-24 DIAGNOSIS — Y92410 Unspecified street and highway as the place of occurrence of the external cause: Secondary | ICD-10-CM | POA: Diagnosis not present

## 2022-06-24 HISTORY — PX: FEMUR IM NAIL: SHX1597

## 2022-06-24 LAB — CBC
HCT: 45.6 % (ref 39.0–52.0)
Hemoglobin: 14.9 g/dL (ref 13.0–17.0)
MCH: 28.9 pg (ref 26.0–34.0)
MCHC: 32.7 g/dL (ref 30.0–36.0)
MCV: 88.4 fL (ref 80.0–100.0)
Platelets: 200 10*3/uL (ref 150–400)
RBC: 5.16 MIL/uL (ref 4.22–5.81)
RDW: 14 % (ref 11.5–15.5)
WBC: 11.9 10*3/uL — ABNORMAL HIGH (ref 4.0–10.5)
nRBC: 0 % (ref 0.0–0.2)

## 2022-06-24 LAB — URINALYSIS, ROUTINE W REFLEX MICROSCOPIC
Bilirubin Urine: NEGATIVE
Glucose, UA: NEGATIVE mg/dL
Hgb urine dipstick: NEGATIVE
Ketones, ur: NEGATIVE mg/dL
Leukocytes,Ua: NEGATIVE
Nitrite: NEGATIVE
Protein, ur: NEGATIVE mg/dL
Specific Gravity, Urine: 1.036 — ABNORMAL HIGH (ref 1.005–1.030)
pH: 6 (ref 5.0–8.0)

## 2022-06-24 LAB — I-STAT CHEM 8, ED
BUN: 9 mg/dL (ref 8–23)
Calcium, Ion: 1 mmol/L — ABNORMAL LOW (ref 1.15–1.40)
Chloride: 104 mmol/L (ref 98–111)
Creatinine, Ser: 1.1 mg/dL (ref 0.61–1.24)
Glucose, Bld: 107 mg/dL — ABNORMAL HIGH (ref 70–99)
HCT: 45 % (ref 39.0–52.0)
Hemoglobin: 15.3 g/dL (ref 13.0–17.0)
Potassium: 2.9 mmol/L — ABNORMAL LOW (ref 3.5–5.1)
Sodium: 141 mmol/L (ref 135–145)
TCO2: 22 mmol/L (ref 22–32)

## 2022-06-24 LAB — COMPREHENSIVE METABOLIC PANEL
ALT: 12 U/L (ref 0–44)
AST: 20 U/L (ref 15–41)
Albumin: 3 g/dL — ABNORMAL LOW (ref 3.5–5.0)
Alkaline Phosphatase: 63 U/L (ref 38–126)
Anion gap: 9 (ref 5–15)
BUN: 8 mg/dL (ref 8–23)
CO2: 24 mmol/L (ref 22–32)
Calcium: 8.2 mg/dL — ABNORMAL LOW (ref 8.9–10.3)
Chloride: 106 mmol/L (ref 98–111)
Creatinine, Ser: 1.2 mg/dL (ref 0.61–1.24)
GFR, Estimated: >60 mL/min (ref >60)
Glucose, Bld: 112 mg/dL — ABNORMAL HIGH (ref 70–99)
Potassium: 3 mmol/L — ABNORMAL LOW (ref 3.5–5.1)
Sodium: 139 mmol/L (ref 135–145)
Total Bilirubin: 0.5 mg/dL (ref 0.3–1.2)
Total Protein: 6 g/dL — ABNORMAL LOW (ref 6.5–8.1)

## 2022-06-24 LAB — SAMPLE TO BLOOD BANK

## 2022-06-24 LAB — ETHANOL: Alcohol, Ethyl (B): <10 mg/dL (ref <10)

## 2022-06-24 LAB — PROTIME-INR
INR: 1.1 (ref 0.8–1.2)
Prothrombin Time: 14.2 seconds (ref 11.4–15.2)

## 2022-06-24 LAB — SURGICAL PCR SCREEN
MRSA, PCR: NEGATIVE
Staphylococcus aureus: NEGATIVE

## 2022-06-24 LAB — LACTIC ACID, PLASMA: Lactic Acid, Venous: 2.5 mmol/L (ref 0.5–1.9)

## 2022-06-24 LAB — HIV ANTIBODY (ROUTINE TESTING W REFLEX): HIV Screen 4th Generation wRfx: NONREACTIVE

## 2022-06-24 SURGERY — INSERTION, INTRAMEDULLARY ROD, FEMUR, RETROGRADE
Anesthesia: General | Laterality: Right

## 2022-06-24 MED ORDER — METOCLOPRAMIDE HCL 5 MG/ML IJ SOLN: 5.0000 mg | Freq: Three times a day (TID) | INTRAMUSCULAR | Status: DC | PRN

## 2022-06-24 MED ORDER — ONDANSETRON HCL 4 MG/2ML IJ SOLN: 4.0000 mg | Freq: Four times a day (QID) | INTRAMUSCULAR | Status: DC | PRN

## 2022-06-24 MED ORDER — HYDRALAZINE HCL 10 MG PO TABS: 10.0000 mg | ORAL_TABLET | Freq: Four times a day (QID) | ORAL | Status: DC | PRN

## 2022-06-24 MED ORDER — ONDANSETRON HCL 4 MG PO TABS: 4.0000 mg | ORAL_TABLET | Freq: Four times a day (QID) | ORAL | Status: DC | PRN

## 2022-06-24 MED ORDER — CHLORHEXIDINE GLUCONATE 4 % EX LIQD: 60.0000 mL | Freq: Once | CUTANEOUS | Status: DC

## 2022-06-24 MED ORDER — FENTANYL CITRATE PF 50 MCG/ML IJ SOSY: 75.0000 ug | PREFILLED_SYRINGE | Freq: Once | INTRAMUSCULAR | Status: DC

## 2022-06-24 MED ORDER — POVIDONE-IODINE 10 % EX SWAB: 2.0000 | Freq: Once | CUTANEOUS | Status: DC

## 2022-06-24 MED ORDER — ALFUZOSIN HCL ER 10 MG PO TB24
10.0000 mg | ORAL_TABLET | Freq: Every day | ORAL | Status: DC
  Administered 2022-06-26 – 2022-06-27 (×2): 10 mg via ORAL
  Filled 2022-06-24 (×3): qty 1

## 2022-06-24 MED ORDER — PROPOFOL 10 MG/ML IV BOLUS
INTRAVENOUS | Status: AC
  Filled 2022-06-24: qty 20

## 2022-06-24 MED ORDER — LACTATED RINGERS IV SOLN: INTRAVENOUS | Status: DC

## 2022-06-24 MED ORDER — MIDAZOLAM HCL 2 MG/2ML IJ SOLN
INTRAMUSCULAR | Status: AC
  Filled 2022-06-24: qty 2

## 2022-06-24 MED ORDER — SODIUM CHLORIDE 0.9 % IV SOLN: INTRAVENOUS | Status: DC

## 2022-06-24 MED ORDER — MORPHINE SULFATE (PF) 2 MG/ML IV SOLN: 2.0000 mg | INTRAVENOUS | Status: DC | PRN

## 2022-06-24 MED ORDER — PHENYLEPHRINE 80 MCG/ML (10ML) SYRINGE FOR IV PUSH (FOR BLOOD PRESSURE SUPPORT)
PREFILLED_SYRINGE | INTRAVENOUS | Status: DC | PRN
  Administered 2022-06-24 (×3): 80 ug via INTRAVENOUS
  Administered 2022-06-24 (×2): 160 ug via INTRAVENOUS

## 2022-06-24 MED ORDER — CEFAZOLIN SODIUM 1 G IJ SOLR
INTRAMUSCULAR | Status: AC
  Filled 2022-06-24: qty 20

## 2022-06-24 MED ORDER — FENTANYL CITRATE (PF) 100 MCG/2ML IJ SOLN
INTRAMUSCULAR | Status: AC
  Administered 2022-06-24: 75 ug
  Filled 2022-06-24: qty 2

## 2022-06-24 MED ORDER — POLYETHYLENE GLYCOL 3350 17 G PO PACK: 17.0000 g | PACK | Freq: Every day | ORAL | Status: DC | PRN

## 2022-06-24 MED ORDER — IOHEXOL 350 MG/ML SOLN
100.0000 mL | Freq: Once | INTRAVENOUS | Status: AC | PRN
  Administered 2022-06-24: 100 mL via INTRAVENOUS

## 2022-06-24 MED ORDER — MIDAZOLAM HCL 2 MG/2ML IJ SOLN
INTRAMUSCULAR | Status: DC | PRN
  Administered 2022-06-24: 2 mg via INTRAVENOUS

## 2022-06-24 MED ORDER — TETANUS-DIPHTH-ACELL PERTUSSIS 5-2.5-18.5 LF-MCG/0.5 IM SUSY
0.5000 mL | PREFILLED_SYRINGE | Freq: Once | INTRAMUSCULAR | Status: AC
  Administered 2022-06-24: 0.5 mL via INTRAMUSCULAR
  Filled 2022-06-24: qty 0.5

## 2022-06-24 MED ORDER — LIDOCAINE 2% (20 MG/ML) 5 ML SYRINGE
INTRAMUSCULAR | Status: AC
  Filled 2022-06-24: qty 5

## 2022-06-24 MED ORDER — METOCLOPRAMIDE HCL 5 MG PO TABS: 5.0000 mg | ORAL_TABLET | Freq: Three times a day (TID) | ORAL | Status: DC | PRN

## 2022-06-24 MED ORDER — KETOROLAC TROMETHAMINE 15 MG/ML IJ SOLN
15.0000 mg | Freq: Four times a day (QID) | INTRAMUSCULAR | Status: DC
  Administered 2022-06-24 – 2022-06-25 (×3): 15 mg via INTRAVENOUS
  Filled 2022-06-24 (×3): qty 1

## 2022-06-24 MED ORDER — SUGAMMADEX SODIUM 200 MG/2ML IV SOLN
INTRAVENOUS | Status: DC | PRN
  Administered 2022-06-24: 200 mg via INTRAVENOUS

## 2022-06-24 MED ORDER — OXYCODONE HCL 5 MG PO TABS
5.0000 mg | ORAL_TABLET | ORAL | Status: DC | PRN
  Administered 2022-06-25 (×2): 10 mg via ORAL
  Administered 2022-06-26: 5 mg via ORAL
  Administered 2022-06-27 (×2): 10 mg via ORAL
  Filled 2022-06-24: qty 1
  Filled 2022-06-24 (×4): qty 2

## 2022-06-24 MED ORDER — ROCURONIUM BROMIDE 10 MG/ML (PF) SYRINGE
PREFILLED_SYRINGE | INTRAVENOUS | Status: DC | PRN
  Administered 2022-06-24: 20 mg via INTRAVENOUS
  Administered 2022-06-24: 60 mg via INTRAVENOUS

## 2022-06-24 MED ORDER — VANCOMYCIN HCL IN DEXTROSE 1-5 GM/200ML-% IV SOLN
1000.0000 mg | INTRAVENOUS | Status: AC
  Administered 2022-06-24: 1000 mg via INTRAVENOUS
  Filled 2022-06-24: qty 200

## 2022-06-24 MED ORDER — CHLORHEXIDINE GLUCONATE 0.12 % MT SOLN
15.0000 mL | Freq: Once | OROMUCOSAL | Status: AC
  Administered 2022-06-24: 15 mL via OROMUCOSAL
  Filled 2022-06-24: qty 15

## 2022-06-24 MED ORDER — METFORMIN HCL 500 MG PO TABS
500.0000 mg | ORAL_TABLET | Freq: Every day | ORAL | Status: DC
  Filled 2022-06-24 (×2): qty 1

## 2022-06-24 MED ORDER — LIDOCAINE 2% (20 MG/ML) 5 ML SYRINGE
INTRAMUSCULAR | Status: DC | PRN
  Administered 2022-06-24: 20 mg via INTRAVENOUS

## 2022-06-24 MED ORDER — AMLODIPINE BESYLATE 10 MG PO TABS
10.0000 mg | ORAL_TABLET | Freq: Every day | ORAL | Status: DC
  Administered 2022-06-24 – 2022-06-27 (×2): 10 mg via ORAL
  Filled 2022-06-24 (×4): qty 1

## 2022-06-24 MED ORDER — LISINOPRIL 20 MG PO TABS
20.0000 mg | ORAL_TABLET | Freq: Every day | ORAL | Status: DC
  Administered 2022-06-24 – 2022-06-27 (×2): 20 mg via ORAL
  Filled 2022-06-24 (×4): qty 1

## 2022-06-24 MED ORDER — ONDANSETRON HCL 4 MG/2ML IJ SOLN
INTRAMUSCULAR | Status: AC
  Filled 2022-06-24: qty 2

## 2022-06-24 MED ORDER — ACETAMINOPHEN 500 MG PO TABS
1000.0000 mg | ORAL_TABLET | Freq: Four times a day (QID) | ORAL | Status: DC
  Administered 2022-06-25 – 2022-06-27 (×10): 1000 mg via ORAL
  Filled 2022-06-24 (×10): qty 2

## 2022-06-24 MED ORDER — PHENYLEPHRINE HCL-NACL 20-0.9 MG/250ML-% IV SOLN
INTRAVENOUS | Status: DC | PRN
  Administered 2022-06-24: 25 ug/min via INTRAVENOUS

## 2022-06-24 MED ORDER — FENTANYL CITRATE (PF) 250 MCG/5ML IJ SOLN
INTRAMUSCULAR | Status: AC
  Filled 2022-06-24: qty 5

## 2022-06-24 MED ORDER — 0.9 % SODIUM CHLORIDE (POUR BTL) OPTIME
TOPICAL | Status: DC | PRN
  Administered 2022-06-24: 1000 mL

## 2022-06-24 MED ORDER — LISINOPRIL-HYDROCHLOROTHIAZIDE 20-25 MG PO TABS: 1.0000 | ORAL_TABLET | Freq: Every day | ORAL | Status: DC

## 2022-06-24 MED ORDER — DOCUSATE SODIUM 100 MG PO CAPS
100.0000 mg | ORAL_CAPSULE | Freq: Two times a day (BID) | ORAL | Status: DC
  Administered 2022-06-25 – 2022-06-27 (×5): 100 mg via ORAL
  Filled 2022-06-24 (×6): qty 1

## 2022-06-24 MED ORDER — DIPHENHYDRAMINE HCL 12.5 MG/5ML PO ELIX: 12.5000 mg | ORAL_SOLUTION | ORAL | Status: DC | PRN

## 2022-06-24 MED ORDER — OXYCODONE HCL 5 MG/5ML PO SOLN: 5.0000 mg | Freq: Once | ORAL | Status: DC | PRN

## 2022-06-24 MED ORDER — PROPOFOL 10 MG/ML IV BOLUS
INTRAVENOUS | Status: DC | PRN
  Administered 2022-06-24: 130 mg via INTRAVENOUS

## 2022-06-24 MED ORDER — ORAL CARE MOUTH RINSE: 15.0000 mL | Freq: Once | OROMUCOSAL | Status: AC

## 2022-06-24 MED ORDER — METHOCARBAMOL 1000 MG/10ML IJ SOLN: 500.0000 mg | Freq: Four times a day (QID) | INTRAVENOUS | Status: DC | PRN

## 2022-06-24 MED ORDER — VANCOMYCIN HCL IN DEXTROSE 1-5 GM/200ML-% IV SOLN
1000.0000 mg | Freq: Two times a day (BID) | INTRAVENOUS | Status: AC
  Administered 2022-06-25: 1000 mg via INTRAVENOUS
  Filled 2022-06-24: qty 200

## 2022-06-24 MED ORDER — ENOXAPARIN SODIUM 40 MG/0.4ML IJ SOSY
40.0000 mg | PREFILLED_SYRINGE | INTRAMUSCULAR | Status: DC
  Administered 2022-06-25 – 2022-06-27 (×3): 40 mg via SUBCUTANEOUS
  Filled 2022-06-24 (×3): qty 0.4

## 2022-06-24 MED ORDER — FENTANYL CITRATE (PF) 100 MCG/2ML IJ SOLN
25.0000 ug | INTRAMUSCULAR | Status: DC | PRN
  Administered 2022-06-24: 50 ug via INTRAVENOUS

## 2022-06-24 MED ORDER — OXYCODONE HCL 5 MG PO TABS: 5.0000 mg | ORAL_TABLET | Freq: Once | ORAL | Status: DC | PRN

## 2022-06-24 MED ORDER — FENTANYL CITRATE (PF) 100 MCG/2ML IJ SOLN
INTRAMUSCULAR | Status: AC
  Filled 2022-06-24: qty 2

## 2022-06-24 MED ORDER — FENTANYL CITRATE (PF) 250 MCG/5ML IJ SOLN
INTRAMUSCULAR | Status: DC | PRN
  Administered 2022-06-24: 100 ug via INTRAVENOUS
  Administered 2022-06-24 (×2): 50 ug via INTRAVENOUS

## 2022-06-24 MED ORDER — ONDANSETRON HCL 4 MG/2ML IJ SOLN
INTRAMUSCULAR | Status: DC | PRN
  Administered 2022-06-24: 4 mg via INTRAVENOUS

## 2022-06-24 MED ORDER — METHOCARBAMOL 500 MG PO TABS
500.0000 mg | ORAL_TABLET | Freq: Four times a day (QID) | ORAL | Status: DC | PRN
  Administered 2022-06-25 – 2022-06-27 (×7): 500 mg via ORAL
  Filled 2022-06-24 (×8): qty 1

## 2022-06-24 MED ORDER — ROCURONIUM BROMIDE 10 MG/ML (PF) SYRINGE
PREFILLED_SYRINGE | INTRAVENOUS | Status: AC
  Filled 2022-06-24: qty 10

## 2022-06-24 MED ORDER — HYDROCHLOROTHIAZIDE 25 MG PO TABS
25.0000 mg | ORAL_TABLET | Freq: Every day | ORAL | Status: DC
  Administered 2022-06-24 – 2022-06-27 (×2): 25 mg via ORAL
  Filled 2022-06-24 (×4): qty 1

## 2022-06-24 MED ORDER — PHENYLEPHRINE 80 MCG/ML (10ML) SYRINGE FOR IV PUSH (FOR BLOOD PRESSURE SUPPORT)
PREFILLED_SYRINGE | INTRAVENOUS | Status: AC
  Filled 2022-06-24: qty 10

## 2022-06-24 SURGICAL SUPPLY — 65 items
ADH SKN CLS APL DERMABOND .7 (GAUZE/BANDAGES/DRESSINGS) ×1
APL PRP STRL LF DISP 70% ISPRP (MISCELLANEOUS) ×1
BAG COUNTER SPONGE SURGICOUNT (BAG) ×1 IMPLANT
BAG SPNG CNTER NS LX DISP (BAG) ×1
BIT DRILL CALIBRATED 4.2 (BIT) IMPLANT
BIT DRILL SHORT 4.2 (BIT) IMPLANT
BNDG COHESIVE 4X5 TAN STRL (GAUZE/BANDAGES/DRESSINGS) ×1 IMPLANT
BNDG ELASTIC 4X5.8 VLCR STR LF (GAUZE/BANDAGES/DRESSINGS) ×1 IMPLANT
BNDG ELASTIC 6X5.8 VLCR STR LF (GAUZE/BANDAGES/DRESSINGS) ×1 IMPLANT
BNDG GAUZE DERMACEA FLUFF 4 (GAUZE/BANDAGES/DRESSINGS) IMPLANT
BNDG GZE DERMACEA 4 6PLY (GAUZE/BANDAGES/DRESSINGS) ×1
BRUSH SCRUB EZ PLAIN DRY (MISCELLANEOUS) ×2 IMPLANT
CHLORAPREP W/TINT 26 (MISCELLANEOUS) ×1 IMPLANT
COVER MAYO STAND STRL (DRAPES) ×1 IMPLANT
COVER SURGICAL LIGHT HANDLE (MISCELLANEOUS) ×2 IMPLANT
DERMABOND ADVANCED .7 DNX12 (GAUZE/BANDAGES/DRESSINGS) ×1 IMPLANT
DRAPE C-ARM 42X72 X-RAY (DRAPES) ×1 IMPLANT
DRAPE C-ARMOR (DRAPES) ×1 IMPLANT
DRAPE HALF SHEET 40X57 (DRAPES) ×2 IMPLANT
DRAPE IMP U-DRAPE 54X76 (DRAPES) ×2 IMPLANT
DRAPE INCISE IOBAN 66X45 STRL (DRAPES) ×1 IMPLANT
DRAPE ORTHO SPLIT 77X108 STRL (DRAPES) ×2
DRAPE SURG 17X23 STRL (DRAPES) ×1 IMPLANT
DRAPE SURG ORHT 6 SPLT 77X108 (DRAPES) ×2 IMPLANT
DRAPE U-SHAPE 47X51 STRL (DRAPES) ×1 IMPLANT
DRESSING MEPILEX FLEX 4X4 (GAUZE/BANDAGES/DRESSINGS) IMPLANT
DRILL BIT CALIBRATED 4.2 (BIT) ×1
DRILL BIT SHORT 4.2 (BIT) ×2
DRSG ADAPTIC 3X8 NADH LF (GAUZE/BANDAGES/DRESSINGS) IMPLANT
DRSG MEPILEX FLEX 4X4 (GAUZE/BANDAGES/DRESSINGS) ×3
ELECT REM PT RETURN 9FT ADLT (ELECTROSURGICAL) ×1
ELECTRODE REM PT RTRN 9FT ADLT (ELECTROSURGICAL) ×1 IMPLANT
GAUZE SPONGE 4X4 12PLY STRL (GAUZE/BANDAGES/DRESSINGS) ×1 IMPLANT
GLOVE BIO SURGEON STRL SZ 6.5 (GLOVE) ×3 IMPLANT
GLOVE BIO SURGEON STRL SZ7.5 (GLOVE) ×4 IMPLANT
GLOVE BIOGEL PI IND STRL 6.5 (GLOVE) ×1 IMPLANT
GLOVE BIOGEL PI IND STRL 7.5 (GLOVE) ×1 IMPLANT
GOWN STRL REUS W/ TWL LRG LVL3 (GOWN DISPOSABLE) ×2 IMPLANT
GOWN STRL REUS W/TWL LRG LVL3 (GOWN DISPOSABLE) ×3
GUIDEWIRE 3.2X400 (WIRE) IMPLANT
KIT BASIN OR (CUSTOM PROCEDURE TRAY) ×1 IMPLANT
KIT TURNOVER KIT B (KITS) ×1 IMPLANT
NAIL IM RFNA BEND 11X420 5D (Nail) IMPLANT
PACK ORTHO EXTREMITY (CUSTOM PROCEDURE TRAY) ×1 IMPLANT
PAD ARMBOARD 7.5X6 YLW CONV (MISCELLANEOUS) ×2 IMPLANT
PAD CAST 4YDX4 CTTN HI CHSV (CAST SUPPLIES) IMPLANT
PADDING CAST COTTON 4X4 STRL (CAST SUPPLIES) ×1
PADDING CAST COTTON 6X4 STRL (CAST SUPPLIES) IMPLANT
REAMER ROD DEEP FLUTE 2.5X950 (INSTRUMENTS) IMPLANT
SCREW LOCK IM 44X5X (Screw) IMPLANT
SCREW LOCK IM NAIL 5X44 (Screw) ×1 IMPLANT
SCREW LOCK IM NAIL 5X54 (Screw) IMPLANT
SCREW LOCK IM NL 5X38 (Screw) IMPLANT
SCREW LOCK IM NL 5X76 (Screw) IMPLANT
SPONGE T-LAP 18X18 ~~LOC~~+RFID (SPONGE) ×1 IMPLANT
STAPLER VISISTAT 35W (STAPLE) ×1 IMPLANT
SUT MNCRL AB 3-0 PS2 18 (SUTURE) ×1 IMPLANT
SUT VIC AB 0 CT1 27 (SUTURE)
SUT VIC AB 0 CT1 27XBRD ANBCTR (SUTURE) IMPLANT
SUT VIC AB 2-0 CT1 27 (SUTURE) ×1
SUT VIC AB 2-0 CT1 TAPERPNT 27 (SUTURE) IMPLANT
TOWEL GREEN STERILE (TOWEL DISPOSABLE) ×2 IMPLANT
TOWEL GREEN STERILE FF (TOWEL DISPOSABLE) ×1 IMPLANT
TUBE CONNECTING 12X1/4 (SUCTIONS) ×1 IMPLANT
YANKAUER SUCT BULB TIP NO VENT (SUCTIONS) ×1 IMPLANT

## 2022-06-24 NOTE — ED Notes (Signed)
Patient transported to CT 
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
## 2022-06-24 NOTE — Interval H&P Note (Signed)
History and Physical Interval Note:  06/24/2022 2:48 PM  Charles Evans  has presented today for surgery, with the diagnosis of Right femur fracture.  The various methods of treatment have been discussed with the patient and family. After consideration of risks, benefits and other options for treatment, the patient has consented to  Procedure(s): INTRAMEDULLARY (IM) RETROGRADE FEMORAL NAILING (Right) as a surgical intervention.  The patient's history has been reviewed, patient examined, no change in status, stable for surgery.  I have reviewed the patient's chart and labs.  Questions were answered to the patient's satisfaction.     Lennette Bihari P Tamme Mozingo
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
## 2022-06-24 NOTE — Progress Notes (Signed)
CRITICAL VALUE STICKER  CRITICAL VALUE: lactic acid  RECEIVER (on-site recipient of call): Drema Balzarine RN  DATE & TIME NOTIFIED: 06/24/22 at 1445  MESSENGER (representative from lab):  MD NOTIFIED: Dr. Lennette Bihari Haddix  TIME OF NOTIFICATION: 9022  RESPONSE:  aware
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
## 2022-06-24 NOTE — ED Notes (Signed)
Hosp. Bed ordered from Transport at 13:28
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
## 2022-06-24 NOTE — Transfer of Care (Signed)
Immediate Anesthesia Transfer of Care Note  Patient: Charles Evans  Procedure(s) Performed: INTRAMEDULLARY (IM) RETROGRADE FEMORAL NAILING (Right)  Patient Location: PACU  Anesthesia Type:General  Level of Consciousness: awake, alert , and patient cooperative  Airway & Oxygen Therapy: Patient Spontanous Breathing and Patient connected to nasal cannula oxygen  Post-op Assessment: Report given to RN and Post -op Vital signs reviewed and stable  Post vital signs: Reviewed and stable  Last Vitals:  Vitals Value Taken Time  BP 135/66 06/24/22 1722  Temp 36.8 C 06/24/22 1722  Pulse 98 06/24/22 1726  Resp 21 06/24/22 1726  SpO2 94 % 06/24/22 1726  Vitals shown include unvalidated device data.  Last Pain:  Vitals:   06/24/22 1722  TempSrc:   PainSc: 8          Complications: No notable events documented.

## 2022-06-24 NOTE — Anesthesia Procedure Notes (Signed)
Procedure Name: Intubation Date/Time: 06/24/2022 3:50 PM  Performed by: Carolan Clines, CRNAPre-anesthesia Checklist: Patient identified, Emergency Drugs available, Suction available and Patient being monitored Patient Re-evaluated:Patient Re-evaluated prior to induction Oxygen Delivery Method: Circle System Utilized Preoxygenation: Pre-oxygenation with 100% oxygen Induction Type: IV induction Ventilation: Mask ventilation without difficulty Laryngoscope Size: Mac and 4 Grade View: Grade I Tube type: Oral Tube size: 7.5 mm Number of attempts: 1 Airway Equipment and Method: Stylet Placement Confirmation: ETT inserted through vocal cords under direct vision, positive ETCO2 and breath sounds checked- equal and bilateral Secured at: 23 cm Tube secured with: Tape Dental Injury: Teeth and Oropharynx as per pre-operative assessment
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
## 2022-06-24 NOTE — Anesthesia Preprocedure Evaluation (Signed)
Anesthesia Evaluation  Patient identified by MRN, date of birth, ID band Patient awake    Reviewed: Allergy & Precautions, H&P , NPO status , Patient's Chart, lab work & pertinent test results  Airway Mallampati: II   Neck ROM: full    Dental   Pulmonary Current Smoker   breath sounds clear to auscultation       Cardiovascular hypertension,  Rhythm:regular Rate:Normal     Neuro/Psych    GI/Hepatic   Endo/Other    Renal/GU      Musculoskeletal   Abdominal   Peds  Hematology   Anesthesia Other Findings   Reproductive/Obstetrics                             Anesthesia Physical Anesthesia Plan  ASA: 2  Anesthesia Plan: General   Post-op Pain Management:    Induction: Intravenous  PONV Risk Score and Plan: 1 and Ondansetron, Midazolam and Treatment may vary due to age or medical condition  Airway Management Planned: Oral ETT  Additional Equipment:   Intra-op Plan:   Post-operative Plan: Extubation in OR  Informed Consent: I have reviewed the patients History and Physical, chart, labs and discussed the procedure including the risks, benefits and alternatives for the proposed anesthesia with the patient or authorized representative who has indicated his/her understanding and acceptance.     Dental advisory given  Plan Discussed with: CRNA, Anesthesiologist and Surgeon  Anesthesia Plan Comments:        Anesthesia Quick Evaluation

## 2022-06-24 NOTE — Progress Notes (Signed)
   06/24/22 1305  Clinical Encounter Type  Visited With Patient and family together;Health care provider (WIFE: June Pitt)  Visit Type ED;Trauma;Critical Care;Initial  Referral From Nurse Montez Morita, RN)  Consult/Referral To Chaplain Melvenia Beam)  Recommendations LEVEL 1 TRAUMA  Spiritual Encounters  Spiritual Needs Emotional   Level 1 Trauma. Chaplain met with patient's WIFE: June Shiley who arrived shortly after patient. Provided meaningful presence and hospitality. Accompanied her and family to E.D. Family Consultation Room. [address] [place], [place]., [phone]
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
## 2022-06-25 ENCOUNTER — Ambulatory Visit: Payer: Medicaid Other | Admitting: Urology

## 2022-06-25 LAB — BASIC METABOLIC PANEL
Anion gap: 10 (ref 5–15)
BUN: 9 mg/dL (ref 8–23)
CO2: 27 mmol/L (ref 22–32)
Calcium: 8.2 mg/dL — ABNORMAL LOW (ref 8.9–10.3)
Chloride: 100 mmol/L (ref 98–111)
Creatinine, Ser: 1.34 mg/dL — ABNORMAL HIGH (ref 0.61–1.24)
GFR, Estimated: 59 mL/min — ABNORMAL LOW (ref >60)
Glucose, Bld: 156 mg/dL — ABNORMAL HIGH (ref 70–99)
Potassium: 3 mmol/L — ABNORMAL LOW (ref 3.5–5.1)
Sodium: 137 mmol/L (ref 135–145)

## 2022-06-25 LAB — CBC
HCT: 36 % — ABNORMAL LOW (ref 39.0–52.0)
Hemoglobin: 12.4 g/dL — ABNORMAL LOW (ref 13.0–17.0)
MCH: 29.7 pg (ref 26.0–34.0)
MCHC: 34.4 g/dL (ref 30.0–36.0)
MCV: 86.1 fL (ref 80.0–100.0)
Platelets: 180 10*3/uL (ref 150–400)
RBC: 4.18 MIL/uL — ABNORMAL LOW (ref 4.22–5.81)
RDW: 13.7 % (ref 11.5–15.5)
WBC: 8.3 10*3/uL (ref 4.0–10.5)
nRBC: 0 % (ref 0.0–0.2)

## 2022-06-25 LAB — VITAMIN D 25 HYDROXY (VIT D DEFICIENCY, FRACTURES): Vit D, 25-Hydroxy: 29.34 ng/mL — ABNORMAL LOW (ref 30–100)

## 2022-06-25 MED ORDER — VITAMIN D 25 MCG (1000 UNIT) PO TABS
1000.0000 [IU] | ORAL_TABLET | Freq: Every day | ORAL | Status: DC
  Administered 2022-06-25 – 2022-06-27 (×3): 1000 [IU] via ORAL
  Filled 2022-06-25 (×3): qty 1

## 2022-06-25 MED ORDER — POTASSIUM CHLORIDE CRYS ER 20 MEQ PO TBCR
20.0000 meq | EXTENDED_RELEASE_TABLET | Freq: Two times a day (BID) | ORAL | Status: DC
  Administered 2022-06-25 (×2): 20 meq via ORAL
  Filled 2022-06-25 (×2): qty 1
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
## 2022-06-26 LAB — BASIC METABOLIC PANEL
Anion gap: 5 (ref 5–15)
BUN: 12 mg/dL (ref 8–23)
CO2: 26 mmol/L (ref 22–32)
Calcium: 7.7 mg/dL — ABNORMAL LOW (ref 8.9–10.3)
Chloride: 106 mmol/L (ref 98–111)
Creatinine, Ser: 1.27 mg/dL — ABNORMAL HIGH (ref 0.61–1.24)
GFR, Estimated: >60 mL/min (ref >60)
Glucose, Bld: 97 mg/dL (ref 70–99)
Potassium: 3.3 mmol/L — ABNORMAL LOW (ref 3.5–5.1)
Sodium: 137 mmol/L (ref 135–145)

## 2022-06-26 LAB — CBC
HCT: 32.5 % — ABNORMAL LOW (ref 39.0–52.0)
Hemoglobin: 11 g/dL — ABNORMAL LOW (ref 13.0–17.0)
MCH: 29.5 pg (ref 26.0–34.0)
MCHC: 33.8 g/dL (ref 30.0–36.0)
MCV: 87.1 fL (ref 80.0–100.0)
Platelets: 160 10*3/uL (ref 150–400)
RBC: 3.73 MIL/uL — ABNORMAL LOW (ref 4.22–5.81)
RDW: 13.8 % (ref 11.5–15.5)
WBC: 8.6 10*3/uL (ref 4.0–10.5)
nRBC: 0 % (ref 0.0–0.2)

## 2022-06-26 MED ORDER — POTASSIUM CHLORIDE CRYS ER 20 MEQ PO TBCR
20.0000 meq | EXTENDED_RELEASE_TABLET | Freq: Three times a day (TID) | ORAL | Status: DC
  Administered 2022-06-26 – 2022-06-27 (×4): 20 meq via ORAL
  Filled 2022-06-26 (×4): qty 1

## 2022-06-26 NOTE — Progress Notes (Signed)
Orthopaedic Trauma Progress Note  SUBJECTIVE: Doing fairly well this AM. Pain controlled in the leg. No chest pain. No SOB. No nausea/vomiting. No other complaints.  Tolerating diet and fluids.  Mobilizing well with therapies.  Potassium low this AM but improving from yesterday, continue to replace today.  Creatinine remains slightly elevated but improving  OBJECTIVE:  Vitals:   06/26/22 0506 06/26/22 0844  BP: 121/79 109/74  Pulse: 71 68  Resp: 19 17  Temp: 99.6 F (37.6 C) 98.9 F (37.2 C)  SpO2: 96% 94%    General: Sitting up in bed, NAD Respiratory: No increased work of breathing.  RLE: Dressings removed, incisions CDI. Tender throughout thigh as expected. Compartments soft and compressible. Sensation intact throughout extremity. Ankle DF/PF intact. +EHL/FHL  IMAGING: Stable post op imaging.   LABS:  Results for orders placed or performed during the hospital encounter of 06/24/22 (from the past 24 hour(s))  CBC     Status: Abnormal   Collection Time: 06/26/22  3:39 AM  Result Value Ref Range   WBC 8.6 4.0 - 10.5 K/uL   RBC 3.73 (L) 4.22 - 5.81 MIL/uL   Hemoglobin 11.0 (L) 13.0 - 17.0 g/dL   HCT 32.5 (L) 39.0 - 52.0 %   MCV 87.1 80.0 - 100.0 fL   MCH 29.5 26.0 - 34.0 pg   MCHC 33.8 30.0 - 36.0 g/dL   RDW 13.8 11.5 - 15.5 %   Platelets 160 150 - 400 K/uL   nRBC 0.0 0.0 - 0.2 %  Basic metabolic panel     Status: Abnormal   Collection Time: 06/26/22  3:39 AM  Result Value Ref Range   Sodium 137 135 - 145 mmol/L   Potassium 3.3 (L) 3.5 - 5.1 mmol/L   Chloride 106 98 - 111 mmol/L   CO2 26 22 - 32 mmol/L   Glucose, Bld 97 70 - 99 mg/dL   BUN 12 8 - 23 mg/dL   Creatinine, Ser 1.27 (H) 0.61 - 1.24 mg/dL   Calcium 7.7 (L) 8.9 - 10.3 mg/dL   GFR, Estimated >60 >60 mL/min   Anion gap 5 5 - 15    ASSESSMENT: Charles Evans is a 66 y.o. male, 2 Days Post-Op s/p INTRAMEDULLARY RETROGRADE RIGHT FEMORAL NAILING  CV/Blood loss: Acute blood loss anemia, Hgb 11.0 this AM.  Hemodynamically stable  PLAN: Weightbearing: TDWB RLE ROM: Ok for unrestricted hip and knee ROM  Incisional and dressing care: Okay to leave incisions open to air Showering: Ok to begin getting incisions wet 05/28/22 Orthopedic device(s): None  Pain management:  1. Tylenol 1000 mg q 6 hours scheduled 2. Robaxin 500 mg q 6 hours PRN 3. Oxycodone 5-15 mg q 4 hours PRN 4. Morphine 2 mg q 4 hours PRN VTE prophylaxis:  Lovenox , SCDs ID:  Vancomycin post op completed Foley/Lines:  No foley, KVO IVFs Impediments to Fracture Healing: Vit D level 29, started on supplementation Dispo: Therapies as tolerated, PT recommending home health.  No formal OT follow-up needed at discharge.  Continue to monitor creatinine, encourage patient to increase fluid intake.  Continue replacement of K today.  Plan for discharge home tomorrow  D/C recommendations: - Oxycodone and Robaxin for pain control - ASA 325 mg for DVT prophylaxis - Continue 1000 units daily Vit D supplementation  Follow - up plan: 2 weeks discharge for wound check and repeat x-rays   Contact information:  Katha Hamming MD, Rushie Nyhan PA-C. After hours and holidays please check [email or web address]  for group call information for [facility]   Gwinda Passe, PA-C [phone] (office) [email or web address]
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
## 2022-06-26 NOTE — Progress Notes (Signed)
Dressing to right hand changed per pt request. Minimal serosanguinous drainage noted. Vaseline gauze and kerlex applied. Pt tolerated well.

## 2022-06-26 NOTE — Anesthesia Postprocedure Evaluation (Signed)
Anesthesia Post Note  Patient: Charles Evans  Procedure(s) Performed: INTRAMEDULLARY (IM) RETROGRADE FEMORAL NAILING (Right)     Patient location during evaluation: PACU Anesthesia Type: General Level of consciousness: awake and alert Pain management: pain level controlled Vital Signs Assessment: post-procedure vital signs reviewed and stable Respiratory status: spontaneous breathing, nonlabored ventilation, respiratory function stable and patient connected to nasal cannula oxygen Cardiovascular status: blood pressure returned to baseline and stable Postop Assessment: no apparent nausea or vomiting Anesthetic complications: no   No notable events documented.  Last Vitals:  Vitals:   06/25/22 2249 06/26/22 0506  BP: 114/66 121/79  Pulse: 80 71  Resp: 16 19  Temp: (!) 38.1 C 37.6 C  SpO2: 90% 96%    Last Pain:  Vitals:   06/26/22 0506  TempSrc: Oral  PainSc:                  [place]

## 2022-06-27 LAB — BASIC METABOLIC PANEL
Anion gap: 7 (ref 5–15)
BUN: 9 mg/dL (ref 8–23)
CO2: 25 mmol/L (ref 22–32)
Calcium: 7.9 mg/dL — ABNORMAL LOW (ref 8.9–10.3)
Chloride: 104 mmol/L (ref 98–111)
Creatinine, Ser: 1.05 mg/dL (ref 0.61–1.24)
GFR, Estimated: >60 mL/min (ref >60)
Glucose, Bld: 114 mg/dL — ABNORMAL HIGH (ref 70–99)
Potassium: 3.4 mmol/L — ABNORMAL LOW (ref 3.5–5.1)
Sodium: 136 mmol/L (ref 135–145)

## 2022-06-27 MED ORDER — VITAMIN D3 25 MCG PO TABS: 1000.0000 [IU] | ORAL_TABLET | Freq: Every day | ORAL | 2 refills | Status: AC

## 2022-06-27 MED ORDER — METHOCARBAMOL 500 MG PO TABS: 500.0000 mg | ORAL_TABLET | Freq: Four times a day (QID) | ORAL | 0 refills | Status: DC | PRN

## 2022-06-27 MED ORDER — OXYCODONE HCL 10 MG PO TABS: 5.0000 mg | ORAL_TABLET | ORAL | 0 refills | Status: DC | PRN

## 2022-06-27 MED ORDER — ASPIRIN 325 MG PO TBEC: 325.0000 mg | DELAYED_RELEASE_TABLET | Freq: Every day | ORAL | 0 refills | Status: AC

## 2022-06-27 MED ORDER — POTASSIUM CHLORIDE CRYS ER 20 MEQ PO TBCR: 20.0000 meq | EXTENDED_RELEASE_TABLET | Freq: Two times a day (BID) | ORAL | 0 refills | Status: AC

## 2022-06-27 MED ORDER — ACETAMINOPHEN 500 MG PO TABS: 500.0000 mg | ORAL_TABLET | Freq: Four times a day (QID) | ORAL | 0 refills | Status: AC | PRN

## 2022-06-27 NOTE — Discharge Instructions (Addendum)
Orthopaedic Trauma Service Discharge Instructions   General Discharge Instructions  WEIGHT BEARING STATUS: Touchdown weightbearing right lower extremity  RANGE OF MOTION/ACTIVITY: Okay for hip and knee range of motion as tolerated  Wound Care: Incisions can be left open to air if there is no drainage. If incision continues to have drainage, follow wound care instructions below. Okay to shower if no drainage from incisions.  DVT/PE prophylaxis: Aspirin 325 mg daily  Diet: as you were eating previously.  Can use over the counter stool softeners and bowel preparations, such as Miralax, to help with bowel movements.  Narcotics can be constipating.  Be sure to drink plenty of fluids  PAIN MEDICATION USE AND EXPECTATIONS  You have likely been given narcotic medications to help control your pain.  After a traumatic event that results in an fracture (broken bone) with or without surgery, it is ok to use narcotic pain medications to help control one's pain.  We understand that everyone responds to pain differently and each individual patient will be evaluated on a regular basis for the continued need for narcotic medications. Ideally, narcotic medication use should last no more than 6-8 weeks (coinciding with fracture healing).   As a patient it is your responsibility as well to monitor narcotic medication use and report the amount and frequency you use these medications when you come to your office visit.   We would also advise that if you are using narcotic medications, you should take a dose prior to therapy to maximize you participation.  IF YOU ARE ON NARCOTIC MEDICATIONS IT IS NOT PERMISSIBLE TO OPERATE A MOTOR VEHICLE (MOTORCYCLE/CAR/TRUCK/MOPED) OR HEAVY MACHINERY DO NOT MIX NARCOTICS WITH OTHER CNS (CENTRAL NERVOUS SYSTEM) DEPRESSANTS SUCH AS ALCOHOL   STOP SMOKING OR USING NICOTINE PRODUCTS!!!!  As discussed nicotine severely impairs your body's ability to heal surgical and traumatic  wounds but also impairs bone healing.  Wounds and bone heal by forming microscopic blood vessels (angiogenesis) and nicotine is a vasoconstrictor (essentially, shrinks blood vessels).  Therefore, if vasoconstriction occurs to these microscopic blood vessels they essentially disappear and are unable to deliver necessary nutrients to the healing tissue.  This is one modifiable factor that you can do to dramatically increase your chances of healing your injury.    (This means no smoking, no nicotine gum, patches, etc)  DO NOT USE NONSTEROIDAL ANTI-INFLAMMATORY DRUGS (NSAID'S)  Using products such as Advil (ibuprofen), Aleve (naproxen), Motrin (ibuprofen) for additional pain control during fracture healing can delay and/or prevent the healing response.  If you would like to take over the counter (OTC) medication, Tylenol (acetaminophen) is ok.  However, some narcotic medications that are given for pain control contain acetaminophen as well. Therefore, you should not exceed more than 4000 mg of tylenol in a day if you do not have liver disease.  Also note that there are may OTC medicines, such as cold medicines and allergy medicines that my contain tylenol as well.  If you have any questions about medications and/or interactions please ask your doctor/PA or your pharmacist.      ICE AND ELEVATE INJURED/OPERATIVE EXTREMITY  Using ice and elevating the injured extremity above your heart can help with swelling and pain control.  Icing in a pulsatile fashion, such as 20 minutes on and 20 minutes off, can be followed.    Do not place ice directly on skin. Make sure there is a barrier between to skin and the ice pack.    Using frozen items such  as frozen peas works well as the conform nicely to the are that needs to be iced.  USE AN ACE WRAP OR TED HOSE FOR SWELLING CONTROL  In addition to icing and elevation, Ace wraps or TED hose are used to help limit and resolve swelling.  It is recommended to use Ace wraps or  TED hose until you are informed to stop.    When using Ace Wraps start the wrapping distally (farthest away from the body) and wrap proximally (closer to the body)   Example: If you had surgery on your leg or thing and you do not have a splint on, start the ace wrap at the toes and work your way up to the thigh        If you had surgery on your upper extremity and do not have a splint on, start the ace wrap at your fingers and work your way up to the upper arm   [place] REFILLS OR WITH ANY QUESTIONS/CONCERNS: [phone]   VISIT OUR WEBSITE FOR ADDITIONAL INFORMATION: [email or web address]    Discharge Wound Care Instructions  Do NOT apply any ointments, solutions or lotions to pin sites or surgical wounds.  These prevent needed drainage and even though solutions like hydrogen peroxide kill bacteria, they also damage cells lining the pin sites that help fight infection.  Applying lotions or ointments can keep the wounds moist and can cause them to breakdown and open up as well. This can increase the risk for infection. When in doubt call the office.  If any drainage is noted, use one layer of adaptic or Mepitel, then gauze, Kerlix, and an ace wrap. - These dressing supplies should be available at local medical supply stores [facility], [facility], etc) as well as Management consultant ([facility], [facility], [place], etc)  Once the incision is completely dry and without drainage, it may be left open to air out.  Showering may begin 36-48 hours later.  Cleaning gently with soap and water.  Traumatic wounds should be dressed as needed.  One layer of adaptic, gauze, Kerlix, then ace wrap.  The adaptic can be discontinued once the draining has ceased

## 2022-06-27 NOTE — Progress Notes (Signed)
Discharge instructions were given to the pt and his wife. All questions are answered.
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
## 2022-06-28 ENCOUNTER — Encounter (HOSPITAL_COMMUNITY): Payer: Self-pay | Admitting: Student
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
## 2022-07-01 ENCOUNTER — Encounter (HOSPITAL_COMMUNITY): Payer: Self-pay | Admitting: Student

## 2022-07-08 ENCOUNTER — Other Ambulatory Visit: Payer: Self-pay | Admitting: Physician Assistant

## 2022-07-08 DIAGNOSIS — N400 Enlarged prostate without lower urinary tract symptoms: Secondary | ICD-10-CM

## 2022-07-10 ENCOUNTER — Ambulatory Visit (INDEPENDENT_AMBULATORY_CARE_PROVIDER_SITE_OTHER): Payer: Medicare Other | Admitting: Urology

## 2022-07-10 VITALS — BP 112/73 | HR 68

## 2022-07-10 DIAGNOSIS — R351 Nocturia: Secondary | ICD-10-CM | POA: Diagnosis not present

## 2022-07-10 DIAGNOSIS — N400 Enlarged prostate without lower urinary tract symptoms: Secondary | ICD-10-CM | POA: Diagnosis not present

## 2022-07-10 DIAGNOSIS — C61 Malignant neoplasm of prostate: Secondary | ICD-10-CM

## 2022-07-10 NOTE — Progress Notes (Signed)
07/10/2022 3:59 PM   Charles Evans 11-Sep-1955 [id number]  Referring provider: Celene Squibb, MD [address],  [place] [address]  Followup after prostate biopsy   HPI: Charles Evans is a 66yo here for followup after prostate biopsy. Biopsy revealed Gleason 3+3=6 in 1/12 cores 5% of core. PSA 8.0. Prostate volume 70.2cc. He has mild LUTS on uroxatral '10mg'$  qhs. He has erectile dysfunction for which he uses tadalafil '20mg'$  PRN   PMH: Past Medical History:  Diagnosis Date   Hypertension     Surgical History: Past Surgical History:  Procedure Laterality Date   BACK SURGERY     COLONOSCOPY N/A 08/21/2015   Procedure: COLONOSCOPY;  Surgeon: Danie Binder, MD;  [facility];  Service: Endoscopy;  Laterality: N/A;  1430   ESOPHAGOGASTRODUODENOSCOPY N/A 08/21/2015   Procedure: ESOPHAGOGASTRODUODENOSCOPY (EGD);  Surgeon: Danie Binder, MD;  Location: [facility];  Service: Endoscopy;  Laterality: N/A;   FEMUR IM NAIL Right 06/24/2022   Procedure: INTRAMEDULLARY (IM) RETROGRADE FEMORAL NAILING;  Surgeon: Shona Needles, MD;  Location: [facility];  Service: Orthopedics;  Laterality: Right;   HERNIA REPAIR     left groin   LUMBAR LAMINECTOMY/DECOMPRESSION MICRODISCECTOMY Left 09/28/2018   Procedure: Microdiscectomy - L3-L4 - left;  Surgeon: Kary Kos, MD;  Location: [facility];  Service: Neurosurgery;  Laterality: Left;  Microdiscectomy - L3-L4 - left    Home Medications:  Allergies as of 07/10/2022       Reactions   Cyclobenzaprine Swelling   Decadron [dexamethasone] Swelling, Other (See Comments)   Severe hiccups lasting 15 days   Penicillins Swelling, Rash, Other (See Comments)   Did it involve swelling of the face/tongue/throat, SOB, or low BP? Yes Did it involve sudden or severe rash/hives, skin peeling, or any reaction on the inside of your mouth or nose? No Did you need to seek medical attention at a hospital or doctor's office? No When did it last happen?       year  If all above answers are "NO", may proceed with cephalosporin use.        Medication List        Accurate as of July 10, 2022  3:59 PM. If you have any questions, ask your nurse or doctor.          acetaminophen 500 MG tablet Commonly known as: TYLENOL Take 1-2 tablets (500-1,000 mg total) by mouth every 6 (six) hours as needed for mild pain or moderate pain.   alfuzosin 10 MG 24 hr tablet Commonly known as: UROXATRAL TAKE 1 TABLET (10 MG TOTAL) BY MOUTH DAILY WITH BREAKFAST.   amLODipine 10 MG tablet Commonly known as: NORVASC Take 10 mg by mouth daily.   aspirin EC 325 MG tablet Take 1 tablet (325 mg total) by mouth daily.   lisinopril-hydrochlorothiazide 20-25 MG tablet Commonly known as: ZESTORETIC Take 1 tablet by mouth daily.   metFORMIN 500 MG tablet Commonly known as: GLUCOPHAGE Take 500 mg by mouth daily.   methocarbamol 500 MG tablet Commonly known as: ROBAXIN Take 1 tablet (500 mg total) by mouth every 6 (six) hours as needed for muscle spasms.   Oxycodone HCl 10 MG Tabs Take 0.5-1 tablets (5-10 mg total) by mouth every 4 (four) hours as needed for severe pain.   oxymetazoline 0.05 % nasal spray Commonly known as: AFRIN Place 1 spray into both nostrils 2 (two) times daily as needed for congestion.   potassium chloride SA  20 MEQ tablet Commonly known as: KLOR-CON M Take 1 tablet (20 mEq total) by mouth 2 (two) times daily for 5 days.   tadalafil 20 MG tablet Commonly known as: CIALIS Take 1 tablet (20 mg total) by mouth daily as needed. What changed: when to take this   vitamin D3 25 MCG tablet Commonly known as: CHOLECALCIFEROL Take 1 tablet (1,000 Units total) by mouth daily.        Allergies:  Allergies  Allergen Reactions   Cyclobenzaprine Swelling   Decadron [Dexamethasone] Swelling and Other (See Comments)    Severe hiccups lasting 15 days   Penicillins Swelling, Rash and Other (See Comments)    Did it involve  swelling of the face/tongue/throat, SOB, or low BP? Yes Did it involve sudden or severe rash/hives, skin peeling, or any reaction on the inside of your mouth or nose? No Did you need to seek medical attention at a hospital or doctor's office? No When did it last happen?      year  If all above answers are "NO", may proceed with cephalosporin use.      Family History: Family History  Problem Relation Age of Onset   Heart disease Father    Colon cancer Cousin        less than 44    Social History:  reports that he has been smoking cigarettes. He has a 20.00 pack-year smoking history. He has never used smokeless tobacco. He reports that he does not drink alcohol and does not use drugs.  ROS: All other review of systems were reviewed and are negative except what is noted above in HPI  Physical Exam: BP 112/73   Pulse 68   Constitutional:  Alert and oriented, No acute distress. HEENT: [place] AT, moist mucus membranes.  Trachea midline, no masses. Cardiovascular: No clubbing, cyanosis, or edema. Respiratory: Normal respiratory effort, no increased work of breathing. GI: Abdomen is soft, nontender, nondistended, no abdominal masses GU: No CVA tenderness.  Lymph: No cervical or inguinal lymphadenopathy. Skin: No rashes, bruises or suspicious lesions. Neurologic: Grossly intact, no focal deficits, moving all 4 extremities. Psychiatric: Normal mood and affect.  Laboratory Data: Lab Results  Component Value Date   WBC 8.6 06/26/2022   HGB 11.0 (L) 06/26/2022   HCT 32.5 (L) 06/26/2022   MCV 87.1 06/26/2022   PLT 160 06/26/2022    Lab Results  Component Value Date   CREATININE 1.05 06/27/2022    No results found for: "PSA"  No results found for: "TESTOSTERONE"  No results found for: "HGBA1C"  Urinalysis    Component Value Date/Time   COLORURINE YELLOW 06/24/2022 [place] 06/24/2022 1326   APPEARANCEUR Clear 04/15/2022 1146   LABSPEC 1.036 (H) 06/24/2022  1326   PHURINE 6.0 06/24/2022 1326   GLUCOSEU NEGATIVE 06/24/2022 1326   HGBUR NEGATIVE 06/24/2022 1326   BILIRUBINUR NEGATIVE 06/24/2022 1326   BILIRUBINUR Negative 04/15/2022 1146   KETONESUR NEGATIVE 06/24/2022 1326   PROTEINUR NEGATIVE 06/24/2022 1326   NITRITE NEGATIVE 06/24/2022 1326   LEUKOCYTESUR NEGATIVE 06/24/2022 1326    Lab Results  Component Value Date   LABMICR Comment 04/15/2022   WBCUA 0-5 10/12/2021   LABEPIT 0-10 10/12/2021   MUCUS Present 10/12/2021   BACTERIA None seen 10/12/2021    Pertinent Imaging:  No results found for this or any previous visit.  No results found for this or any previous visit.  No results found for this or any previous visit.  No results found  for this or any previous visit.  No results found for this or any previous visit.  No valid procedures specified. No results found for this or any previous visit.  No results found for this or any previous visit.   Assessment & Plan:    1. Prostate cancer [facility]) I discussed the natural history of low/intermediate/high risk prostate cancer with the patient and the various treatment options including active surveillance, RALP, IMRT, brachytherapy, cryotherapy, HIFU and ADT. After discussing the options the patient elects for observation. Followup 3 months with PSA    No follow-ups on file.  Nicolette Bang, MD  [facility] [place]

## 2022-07-18 ENCOUNTER — Encounter: Payer: Self-pay | Admitting: Urology

## 2022-07-18 NOTE — Patient Instructions (Signed)
Prostate Cancer  The prostate is a small gland that produces fluid that makes up semen (seminal fluid). It is located below the bladder in men, in front of the rectum. Prostate cancer is the abnormal growth of cells in the prostate gland. What are the causes? The exact cause of this condition is not known. What increases the risk? You are more likely to develop this condition if: You are 65 years of age or older. You have a family history of prostate cancer. You have a family history of breast and ovarian cancer. You have genes that are passed from parent to child (inherited), such as BRCA1 and BRCA2. You have Lynch syndrome. African American men and men of African descent are diagnosed with prostate cancer at higher rates than other men. The reasons for this are not well understood and are likely due to a combination of genetic and environmental factors. What are the signs or symptoms? Symptoms of this condition include: Problems with urination. This may include: A weak or interrupted flow of urine. Trouble starting or stopping urination. Trouble emptying the bladder all the way. The need to urinate more often, especially at night. Blood in urine or semen. Persistent pain or discomfort in the lower back, lower abdomen, or hips. Trouble getting an erection. Weakness or numbness in the legs or feet. How is this diagnosed? This condition can be diagnosed with: A digital rectal exam. For this exam, a health care provider inserts a gloved finger into the rectum to feel the prostate gland. A blood test called a prostate-specific antigen (PSA) test. A procedure in which a sample of tissue is taken from the prostate and checked under a microscope (prostate biopsy). An imaging test called transrectal ultrasonography. Once the condition is diagnosed, tests will be done to determine how far the cancer has spread. This is called staging the cancer. Staging may involve imaging tests, such as a bone  scan, CT scan, PET scan, or MRI. Stages of prostate cancer The stages of prostate cancer are as follows: Stage 1 (I). At this stage, the cancer is found in the prostate only. The cancer is not visible on imaging tests, and it is usually found by accident, such as during prostate surgery. Stage 2 (II). At this stage, the cancer is more advanced than it is in stage 1, but the cancer has not spread outside the prostate. Stage 3 (III). At this stage, the cancer has spread beyond the outer layer of the prostate to nearby tissues. The cancer may be found in the seminal vesicles, which are near the bladder and the prostate. Stage 4 (IV). At this stage, the cancer has spread to other parts of the body, such as the lymph nodes, bones, bladder, rectum, liver, or lungs. Prostate cancer grading Prostate cancer is also graded according to how the cancer cells look under a microscope. This is called the Gleason score and the total score can range from 6-10, indicating how likely it is that the cancer will spread (metastasize) to other parts of the body. The higher the score, the greater the likelihood that the cancer will spread. Gleason 6 or lower: This indicates that the cancer cells look similar to normal prostate cells (well differentiated). Gleason 7: This indicates that the cancer cells look somewhat similar to normal prostate cells (moderately differentiated). Gleason 8, 9, or 10: This indicates that the cancer cells look very different than normal prostate cells (poorly differentiated). How is this treated? Treatment for this condition depends on several  factors, including the stage of the cancer, your age, personal preferences, and your overall health. Talk with your health care provider about treatment options that are recommended for you. Common treatments include: Observation for early stage prostate cancer (active surveillance). This involves having exams, blood tests, and in some cases, more biopsies.  For some men, this is the only treatment needed. Surgery. Types of surgeries include: Open surgery (radical prostatectomy). In this surgery, a larger incision is made to remove the prostate. A laparoscopic radical prostatectomy. This is a surgery to remove the prostate and lymph nodes through several small incisions. It is often referred to as a minimally invasive surgery. A robotic radical prostatectomy. This is laparoscopic surgery to remove the prostate and lymph nodes with the help of robotic arms that are controlled by the surgeon. Cryoablation. This is surgery to freeze and destroy cancer cells. Radiation treatment. Types of radiation treatment include: External beam radiation. This type aims beams of radiation from outside the body at the prostate to destroy cancerous cells. Brachytherapy. This type uses radioactive needles, seeds, wires, or tubes that are implanted into the prostate gland. Like external beam radiation, brachytherapy destroys cancerous cells. An advantage is that this type of radiation limits the damage to surrounding tissue and has fewer side effects. Chemotherapy. This treatment kills cancer cells or stops them from multiplying. It kills both cancer cells and normal cells. Targeted therapy. This treatment uses medicines to kill cancer cells without damaging normal cells. Hormone treatment. This treatment involves taking medicines that act on testosterone, one of the male hormones, by: Stopping your body from producing testosterone. Blocking testosterone from reaching cancer cells. Follow these instructions at home: Lifestyle Do not use any products that contain nicotine or tobacco. These products include cigarettes, chewing tobacco, and vaping devices, such as e-cigarettes. If you need help quitting, ask your health care provider. Eat a healthy diet. To do this: Eat foods that are high in fiber. These include beans, whole grains, and fresh fruits and vegetables. Limit  foods that are high in fat and sugar. These include fried or sweet foods. Treatment for prostate cancer may affect sexual function. If you have a partner, continue to have intimate moments. This may include touching, holding, hugging, and caressing your partner. Get plenty of sleep. Consider joining a support group for men who have prostate cancer. Meeting with a support group may help you learn to manage the stress of having cancer. General instructions Take over-the-counter and prescription medicines only as told by your health care provider. If you have to go to the hospital, notify your cancer specialist (oncologist). Keep all follow-up visits. This is important. Where to find more information American Cancer Society: www.cancer.[place] [employer] On[employer]: www.cancer.net [employer]: www.cancer.gov Contact a health care provider if: You have new or increasing trouble urinating. You have new or increasing blood in your urine. You have new or increasing pain in your hips, back, or chest. Get help right away if: You have weakness or numbness in your legs. You cannot control urination or your bowel movements (incontinence). You have chills or a fever. Summary The prostate is a small gland that is involved in the production of semen. It is located below a man's bladder, in front of the rectum. Prostate cancer is the abnormal growth of cells in the prostate gland. Treatment for this condition depends on the stage of the cancer, your age, personal preferences, and your overall health. Talk with your health care provider about  treatment options that are recommended for you. Consider joining a support group for men who have prostate cancer. Meeting with a support group may help you learn to manage the stress of having cancer. This information is not intended to replace advice given to you by your health care provider. Make sure you discuss any questions you have with  your health care provider. Document Revised: 10/04/2020 Document Reviewed: 10/04/2020 Elsevier Patient Education  [place].

## 2022-08-01 DIAGNOSIS — G4700 Insomnia, unspecified: Secondary | ICD-10-CM | POA: Insufficient documentation

## 2022-08-14 ENCOUNTER — Telehealth: Payer: Self-pay

## 2022-08-14 ENCOUNTER — Other Ambulatory Visit: Payer: Self-pay

## 2022-08-14 DIAGNOSIS — M5459 Other low back pain: Secondary | ICD-10-CM | POA: Insufficient documentation

## 2022-08-14 DIAGNOSIS — N5201 Erectile dysfunction due to arterial insufficiency: Secondary | ICD-10-CM

## 2022-08-14 DIAGNOSIS — M47816 Spondylosis without myelopathy or radiculopathy, lumbar region: Secondary | ICD-10-CM | POA: Insufficient documentation

## 2022-08-14 MED ORDER — TADALAFIL 20 MG PO TABS: 20.0000 mg | ORAL_TABLET | Freq: Every day | ORAL | 5 refills | Status: DC | PRN

## 2022-08-14 NOTE — Progress Notes (Signed)
Open in error

## 2022-08-14 NOTE — Telephone Encounter (Signed)
Patient called and advised they needed a refill on medication below.   Medication: tadalafil (CIALIS) 20 MG tablet    Pharmacy: [address], [place] [id number]   Thank you

## 2022-08-14 NOTE — Telephone Encounter (Signed)
Made patient aware that his rx was sent to the pharmacy and patient voiced understanding

## 2022-08-22 ENCOUNTER — Ambulatory Visit (INDEPENDENT_AMBULATORY_CARE_PROVIDER_SITE_OTHER): Payer: Medicare Other

## 2022-08-22 ENCOUNTER — Encounter: Payer: Self-pay | Admitting: Orthopedic Surgery

## 2022-08-22 ENCOUNTER — Ambulatory Visit (INDEPENDENT_AMBULATORY_CARE_PROVIDER_SITE_OTHER): Payer: Medicare Other | Admitting: Orthopedic Surgery

## 2022-08-22 VITALS — BP 124/76 | HR 86 | Ht 76.0 in | Wt 200.0 lb

## 2022-08-22 DIAGNOSIS — M25531 Pain in right wrist: Secondary | ICD-10-CM

## 2022-08-22 DIAGNOSIS — S6990XA Unspecified injury of unspecified wrist, hand and finger(s), initial encounter: Secondary | ICD-10-CM | POA: Diagnosis not present

## 2022-08-22 NOTE — Patient Instructions (Signed)
While we are working on your approval forCT please go ahead and call to schedule your appointment with [facility] within at least one (1) week.   Central Scheduling [phone]  
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
## 2022-08-22 NOTE — Addendum Note (Signed)
Addended byCandice Camp on: 08/22/2022 10:53 AM   Modules accepted: Orders

## 2022-09-04 ENCOUNTER — Telehealth: Payer: Self-pay

## 2022-09-04 DIAGNOSIS — N400 Enlarged prostate without lower urinary tract symptoms: Secondary | ICD-10-CM

## 2022-09-04 MED ORDER — ALFUZOSIN HCL ER 10 MG PO TB24: 10.0000 mg | ORAL_TABLET | Freq: Every day | ORAL | 1 refills | Status: DC

## 2022-09-04 NOTE — Telephone Encounter (Signed)
Patient is aware that Rx for Alfuzosin has been sent to his local pharmacy and patient voiced understanding.

## 2022-09-04 NOTE — Telephone Encounter (Signed)
Patient called and advised they needed a refill on medication below.   Medication: alfuzosin (UROXATRAL) 10 MG 24 hr tablet    [id number] [place]   Thank you

## 2022-09-12 ENCOUNTER — Telehealth: Payer: Self-pay

## 2022-09-12 ENCOUNTER — Other Ambulatory Visit: Payer: Self-pay

## 2022-09-12 DIAGNOSIS — N5201 Erectile dysfunction due to arterial insufficiency: Secondary | ICD-10-CM

## 2022-09-12 MED ORDER — TADALAFIL 20 MG PO TABS: 20.0000 mg | ORAL_TABLET | Freq: Every day | ORAL | 5 refills | Status: DC | PRN

## 2022-09-12 NOTE — Telephone Encounter (Signed)
Patient called and advised they needed a refill on medication below.   Medication: alfuzosin (UROXATRAL) 10 MG 24 hr tablet    [id number] [place]   Thank you

## 2022-09-12 NOTE — Telephone Encounter (Signed)
Called patient and rx for tadalafil sent to pharmacy.

## 2022-09-14 ENCOUNTER — Other Ambulatory Visit: Payer: Self-pay | Admitting: Urology

## 2022-09-14 DIAGNOSIS — N5201 Erectile dysfunction due to arterial insufficiency: Secondary | ICD-10-CM

## 2022-09-16 ENCOUNTER — Telehealth: Payer: Self-pay

## 2022-09-16 ENCOUNTER — Other Ambulatory Visit: Payer: Self-pay | Admitting: Urology

## 2022-09-16 DIAGNOSIS — N5201 Erectile dysfunction due to arterial insufficiency: Secondary | ICD-10-CM

## 2022-09-16 NOTE — Telephone Encounter (Signed)
Patient is aware that his medication tadalafil was sent to his pharmacy. Made patient aware that our office can do a PA if it is needed for his medication. Patient voiced understanding

## 2022-09-18 ENCOUNTER — Other Ambulatory Visit: Payer: Self-pay

## 2022-09-18 DIAGNOSIS — N5201 Erectile dysfunction due to arterial insufficiency: Secondary | ICD-10-CM

## 2022-09-18 MED ORDER — TADALAFIL 20 MG PO TABS: 20.0000 mg | ORAL_TABLET | Freq: Every day | ORAL | 5 refills | Status: DC | PRN

## 2022-09-18 NOTE — Telephone Encounter (Signed)
Patient is aware that his Rx Cialis was sent to [place]. Patient voiced understanding.

## 2022-09-19 ENCOUNTER — Encounter: Payer: Self-pay | Admitting: Radiology

## 2022-09-19 ENCOUNTER — Ambulatory Visit (HOSPITAL_COMMUNITY): Admission: RE | Admit: 2022-09-19 | Payer: Medicare Other | Source: Ambulatory Visit

## 2022-09-25 ENCOUNTER — Ambulatory Visit (HOSPITAL_COMMUNITY): Payer: Medicare Other | Attending: Student | Admitting: Physical Therapy

## 2022-09-25 ENCOUNTER — Other Ambulatory Visit: Payer: Self-pay

## 2022-09-25 DIAGNOSIS — R262 Difficulty in walking, not elsewhere classified: Secondary | ICD-10-CM | POA: Insufficient documentation

## 2022-09-25 DIAGNOSIS — M6281 Muscle weakness (generalized): Secondary | ICD-10-CM | POA: Insufficient documentation

## 2022-09-25 DIAGNOSIS — M79604 Pain in right leg: Secondary | ICD-10-CM | POA: Insufficient documentation

## 2022-09-25 DIAGNOSIS — M545 Low back pain, unspecified: Secondary | ICD-10-CM | POA: Diagnosis present
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
## 2022-09-26 ENCOUNTER — Ambulatory Visit (HOSPITAL_COMMUNITY): Payer: Medicare Other

## 2022-10-09 ENCOUNTER — Ambulatory Visit (HOSPITAL_COMMUNITY): Payer: Medicare Other | Admitting: Physical Therapy

## 2022-10-09 ENCOUNTER — Other Ambulatory Visit: Payer: Medicaid Other

## 2022-10-09 DIAGNOSIS — M79604 Pain in right leg: Secondary | ICD-10-CM | POA: Diagnosis not present

## 2022-10-09 DIAGNOSIS — M6281 Muscle weakness (generalized): Secondary | ICD-10-CM

## 2022-10-09 DIAGNOSIS — R262 Difficulty in walking, not elsewhere classified: Secondary | ICD-10-CM

## 2022-10-09 DIAGNOSIS — M545 Low back pain, unspecified: Secondary | ICD-10-CM
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
## 2022-10-10 LAB — PSA, TOTAL AND FREE
PSA, Free Pct: 21.4 %
PSA, Free: 1.97 ng/mL
Prostate Specific Ag, Serum: 9.2 ng/mL — ABNORMAL HIGH (ref 0.0–4.0)

## 2022-10-11 ENCOUNTER — Ambulatory Visit (HOSPITAL_COMMUNITY)
Admission: RE | Admit: 2022-10-11 | Discharge: 2022-10-11 | Disposition: A | Discharge status: Home / Self Care | Payer: Medicare Other | Source: Ambulatory Visit | Attending: Orthopedic Surgery | Admitting: Orthopedic Surgery

## 2022-10-11 DIAGNOSIS — M25531 Pain in right wrist: Secondary | ICD-10-CM | POA: Diagnosis present

## 2022-10-11 DIAGNOSIS — S6990XA Unspecified injury of unspecified wrist, hand and finger(s), initial encounter: Secondary | ICD-10-CM | POA: Diagnosis present

## 2022-10-14 ENCOUNTER — Ambulatory Visit (HOSPITAL_COMMUNITY): Payer: Medicare Other | Admitting: Physical Therapy

## 2022-10-14 DIAGNOSIS — M6281 Muscle weakness (generalized): Secondary | ICD-10-CM

## 2022-10-14 DIAGNOSIS — M545 Low back pain, unspecified: Secondary | ICD-10-CM

## 2022-10-14 DIAGNOSIS — M79604 Pain in right leg: Secondary | ICD-10-CM | POA: Diagnosis not present

## 2022-10-14 DIAGNOSIS — R262 Difficulty in walking, not elsewhere classified: Secondary | ICD-10-CM
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
## 2022-10-16 ENCOUNTER — Ambulatory Visit (INDEPENDENT_AMBULATORY_CARE_PROVIDER_SITE_OTHER): Payer: Medicare Other | Admitting: Urology

## 2022-10-16 ENCOUNTER — Ambulatory Visit (HOSPITAL_COMMUNITY): Payer: Medicare Other | Admitting: Physical Therapy

## 2022-10-16 ENCOUNTER — Encounter (HOSPITAL_COMMUNITY): Payer: Medicare Other | Admitting: Physical Therapy

## 2022-10-16 ENCOUNTER — Encounter: Payer: Self-pay | Admitting: Urology

## 2022-10-16 VITALS — BP 125/79 | HR 76

## 2022-10-16 DIAGNOSIS — R351 Nocturia: Secondary | ICD-10-CM

## 2022-10-16 DIAGNOSIS — M79604 Pain in right leg: Secondary | ICD-10-CM

## 2022-10-16 DIAGNOSIS — C61 Malignant neoplasm of prostate: Secondary | ICD-10-CM

## 2022-10-16 DIAGNOSIS — N400 Enlarged prostate without lower urinary tract symptoms: Secondary | ICD-10-CM | POA: Diagnosis not present

## 2022-10-16 DIAGNOSIS — N5201 Erectile dysfunction due to arterial insufficiency: Secondary | ICD-10-CM

## 2022-10-16 DIAGNOSIS — M6281 Muscle weakness (generalized): Secondary | ICD-10-CM

## 2022-10-16 DIAGNOSIS — R262 Difficulty in walking, not elsewhere classified: Secondary | ICD-10-CM

## 2022-10-16 LAB — MICROSCOPIC EXAMINATION: Bacteria, UA: NONE SEEN

## 2022-10-16 LAB — URINALYSIS, ROUTINE W REFLEX MICROSCOPIC
Bilirubin, UA: NEGATIVE
Glucose, UA: NEGATIVE
Ketones, UA: NEGATIVE
Leukocytes,UA: NEGATIVE
Nitrite, UA: NEGATIVE
Protein,UA: NEGATIVE
Specific Gravity, UA: 1.025 (ref 1.005–1.030)
Urobilinogen, Ur: 1 mg/dL (ref 0.2–1.0)
pH, UA: 5.5 (ref 5.0–7.5)

## 2022-10-16 MED ORDER — ALFUZOSIN HCL ER 10 MG PO TB24: 10.0000 mg | ORAL_TABLET | Freq: Every day | ORAL | 3 refills | Status: DC

## 2022-10-16 MED ORDER — TADALAFIL 20 MG PO TABS: 20.0000 mg | ORAL_TABLET | Freq: Every day | ORAL | 5 refills | Status: DC | PRN

## 2022-10-16 NOTE — Patient Instructions (Signed)
Prostate Cancer  The prostate is a small gland that produces fluid that makes up semen (seminal fluid). It is located below the bladder in men, in front of the rectum. Prostate cancer is the abnormal growth of cells in the prostate gland. What are the causes? The exact cause of this condition is not known. What increases the risk? You are more likely to develop this condition if: You are 65 years of age or older. You have a family history of prostate cancer. You have a family history of breast and ovarian cancer. You have genes that are passed from parent to child (inherited), such as BRCA1 and BRCA2. You have Lynch syndrome. African American men and men of African descent are diagnosed with prostate cancer at higher rates than other men. The reasons for this are not well understood and are likely due to a combination of genetic and environmental factors. What are the signs or symptoms? Symptoms of this condition include: Problems with urination. This may include: A weak or interrupted flow of urine. Trouble starting or stopping urination. Trouble emptying the bladder all the way. The need to urinate more often, especially at night. Blood in urine or semen. Persistent pain or discomfort in the lower back, lower abdomen, or hips. Trouble getting an erection. Weakness or numbness in the legs or feet. How is this diagnosed? This condition can be diagnosed with: A digital rectal exam. For this exam, a health care provider inserts a gloved finger into the rectum to feel the prostate gland. A blood test called a prostate-specific antigen (PSA) test. A procedure in which a sample of tissue is taken from the prostate and checked under a microscope (prostate biopsy). An imaging test called transrectal ultrasonography. Once the condition is diagnosed, tests will be done to determine how far the cancer has spread. This is called staging the cancer. Staging may involve imaging tests, such as a bone  scan, CT scan, PET scan, or MRI. Stages of prostate cancer The stages of prostate cancer are as follows: Stage 1 (I). At this stage, the cancer is found in the prostate only. The cancer is not visible on imaging tests, and it is usually found by accident, such as during prostate surgery. Stage 2 (II). At this stage, the cancer is more advanced than it is in stage 1, but the cancer has not spread outside the prostate. Stage 3 (III). At this stage, the cancer has spread beyond the outer layer of the prostate to nearby tissues. The cancer may be found in the seminal vesicles, which are near the bladder and the prostate. Stage 4 (IV). At this stage, the cancer has spread to other parts of the body, such as the lymph nodes, bones, bladder, rectum, liver, or lungs. Prostate cancer grading Prostate cancer is also graded according to how the cancer cells look under a microscope. This is called the Gleason score and the total score can range from 6-10, indicating how likely it is that the cancer will spread (metastasize) to other parts of the body. The higher the score, the greater the likelihood that the cancer will spread. Gleason 6 or lower: This indicates that the cancer cells look similar to normal prostate cells (well differentiated). Gleason 7: This indicates that the cancer cells look somewhat similar to normal prostate cells (moderately differentiated). Gleason 8, 9, or 10: This indicates that the cancer cells look very different than normal prostate cells (poorly differentiated). How is this treated? Treatment for this condition depends on several   factors, including the stage of the cancer, your age, personal preferences, and your overall health. Talk with your health care provider about treatment options that are recommended for you. Common treatments include: Observation for early stage prostate cancer (active surveillance). This involves having exams, blood tests, and in some cases, more biopsies.  For some men, this is the only treatment needed. Surgery. Types of surgeries include: Open surgery (radical prostatectomy). In this surgery, a larger incision is made to remove the prostate. A laparoscopic radical prostatectomy. This is a surgery to remove the prostate and lymph nodes through several small incisions. It is often referred to as a minimally invasive surgery. A robotic radical prostatectomy. This is laparoscopic surgery to remove the prostate and lymph nodes with the help of robotic arms that are controlled by the surgeon. Cryoablation. This is surgery to freeze and destroy cancer cells. Radiation treatment. Types of radiation treatment include: External beam radiation. This type aims beams of radiation from outside the body at the prostate to destroy cancerous cells. Brachytherapy. This type uses radioactive needles, seeds, wires, or tubes that are implanted into the prostate gland. Like external beam radiation, brachytherapy destroys cancerous cells. An advantage is that this type of radiation limits the damage to surrounding tissue and has fewer side effects. Chemotherapy. This treatment kills cancer cells or stops them from multiplying. It kills both cancer cells and normal cells. Targeted therapy. This treatment uses medicines to kill cancer cells without damaging normal cells. Hormone treatment. This treatment involves taking medicines that act on testosterone, one of the male hormones, by: Stopping your body from producing testosterone. Blocking testosterone from reaching cancer cells. Follow these instructions at home: Lifestyle Do not use any products that contain nicotine or tobacco. These products include cigarettes, chewing tobacco, and vaping devices, such as e-cigarettes. If you need help quitting, ask your health care provider. Eat a healthy diet. To do this: Eat foods that are high in fiber. These include beans, whole grains, and fresh fruits and vegetables. Limit  foods that are high in fat and sugar. These include fried or sweet foods. Treatment for prostate cancer may affect sexual function. If you have a partner, continue to have intimate moments. This may include touching, holding, hugging, and caressing your partner. Get plenty of sleep. Consider joining a support group for men who have prostate cancer. Meeting with a support group may help you learn to manage the stress of having cancer. General instructions Take over-the-counter and prescription medicines only as told by your health care provider. If you have to go to the hospital, notify your cancer specialist (oncologist). Keep all follow-up visits. This is important. Where to find more information American Cancer Society: www.cancer.org American Society of Clinical Oncology: www.cancer.net National Cancer Institute: www.cancer.gov Contact a health care provider if: You have new or increasing trouble urinating. You have new or increasing blood in your urine. You have new or increasing pain in your hips, back, or chest. Get help right away if: You have weakness or numbness in your legs. You cannot control urination or your bowel movements (incontinence). You have chills or a fever. Summary The prostate is a small gland that is involved in the production of semen. It is located below a man's bladder, in front of the rectum. Prostate cancer is the abnormal growth of cells in the prostate gland. Treatment for this condition depends on the stage of the cancer, your age, personal preferences, and your overall health. Talk with your health care provider about   treatment options that are recommended for you. Consider joining a support group for men who have prostate cancer. Meeting with a support group may help you learn to manage the stress of having cancer. This information is not intended to replace advice given to you by your health care provider. Make sure you discuss any questions you have with  your health care provider. Document Revised: 10/04/2020 Document Reviewed: 10/04/2020 Elsevier Patient Education  2023 Elsevier Inc.  
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
## 2022-10-16 NOTE — Progress Notes (Unsigned)
10/16/2022 1:53 PM   [name] [birth year] [id number]  Referring provider: Celene Squibb, MD [address] [place],  [place] [address]  No chief complaint on file.   HPI: Charles Evans is a 67yo here for followup for prostate cancer and BPH. PSA increased to 9.2 from 8.0. IPSS 1 QOl 0 on uroxatral 10mg  qhs. Urine stream strong. Nocturia 0x.    PMH: Past Medical History:  Diagnosis Date   Hypertension     Surgical History: Past Surgical History:  Procedure Laterality Date   BACK SURGERY     COLONOSCOPY N/A 08/21/2015   Procedure: COLONOSCOPY;  Surgeon: Danie Binder, MD;  [facility];  Service: Endoscopy;  Laterality: N/A;  1430   ESOPHAGOGASTRODUODENOSCOPY N/A 08/21/2015   Procedure: ESOPHAGOGASTRODUODENOSCOPY (EGD);  Surgeon: Danie Binder, MD;  [facility];  Service: Endoscopy;  Laterality: N/A;   FEMUR IM NAIL Right 06/24/2022   Procedure: INTRAMEDULLARY (IM) RETROGRADE FEMORAL NAILING;  Surgeon: Shona Needles, MD;  Location: [place] [facility] [place];  Service: Orthopedics;  Laterality: Right;   HERNIA REPAIR     left groin   LUMBAR LAMINECTOMY/DECOMPRESSION MICRODISCECTOMY Left 09/28/2018   Procedure: Microdiscectomy - L3-L4 - left;  Surgeon: Kary Kos, MD;  Location: [place];  Service: Neurosurgery;  Laterality: Left;  Microdiscectomy - L3-L4 - left    Home Medications:  Allergies as of 10/16/2022       Reactions   Cyclobenzaprine Swelling   Decadron [dexamethasone] Swelling, Other (See Comments)   Severe hiccups lasting 15 days   Penicillins Swelling, Rash, Other (See Comments)   Did it involve swelling of the face/tongue/throat, SOB, or low BP? Yes Did it involve sudden or severe rash/hives, skin peeling, or any reaction on the inside of your mouth or nose? No Did you need to seek medical attention at a hospital or doctor's office? No When did it last happen?      year  If all above answers are "NO", may proceed with cephalosporin use.         Medication List        Accurate as of October 16, 2022  1:53 PM. If you have any questions, ask your nurse or doctor.          acetaminophen 500 MG tablet Commonly known as: TYLENOL Take 1-2 tablets (500-1,000 mg total) by mouth every 6 (six) hours as needed for mild pain or moderate pain.   alfuzosin 10 MG 24 hr tablet Commonly known as: UROXATRAL Take 1 tablet (10 mg total) by mouth daily with breakfast.   amLODipine 10 MG tablet Commonly known as: NORVASC Take 10 mg by mouth daily.   HYDROcodone-acetaminophen 7.5-325 MG tablet Commonly known as: NORCO TAKE 1 TABLET BY MOUTH EVERY 6 HOURS (MAX OF 5 DAYS)   lisinopril-hydrochlorothiazide 20-25 MG tablet Commonly known as: ZESTORETIC Take 1 tablet by mouth daily.   metFORMIN 500 MG tablet Commonly known as: GLUCOPHAGE Take 500 mg by mouth daily.   methocarbamol 500 MG tablet Commonly known as: ROBAXIN Take 1 tablet (500 mg total) by mouth every 6 (six) hours as needed for muscle spasms.   oxymetazoline 0.05 % nasal spray Commonly known as: AFRIN Place 1 spray into both nostrils 2 (two) times daily as needed for congestion.   potassium chloride SA 20 MEQ tablet Commonly known as: KLOR-CON M Take 1 tablet (20 mEq total) by mouth 2 (two) times daily for 5 days.   tadalafil 20 MG tablet  Commonly known as: CIALIS Take 1 tablet (20 mg total) by mouth daily as needed.   traZODone 50 MG tablet Commonly known as: DESYREL Take 1 tablet every day by oral route at bedtime.        Allergies:  Allergies  Allergen Reactions   Cyclobenzaprine Swelling   Decadron [Dexamethasone] Swelling and Other (See Comments)    Severe hiccups lasting 15 days   Penicillins Swelling, Rash and Other (See Comments)    Did it involve swelling of the face/tongue/throat, SOB, or low BP? Yes Did it involve sudden or severe rash/hives, skin peeling, or any reaction on the inside of your mouth or nose? No Did you need to seek medical  attention at a hospital or doctor's office? No When did it last happen?      year  If all above answers are "NO", may proceed with cephalosporin use.      Family History: Family History  Problem Relation Age of Onset   Heart disease Father    Colon cancer Cousin        less than 73    Social History:  reports that he has been smoking cigarettes. He has a 20.00 pack-year smoking history. He has never used smokeless tobacco. He reports that he does not drink alcohol and does not use drugs.  ROS: All other review of systems were reviewed and are negative except what is noted above in HPI  Physical Exam: BP 125/79   Pulse 76   Constitutional:  Alert and oriented, No acute distress. HEENT: [place] AT, moist mucus membranes.  Trachea midline, no masses. Cardiovascular: No clubbing, cyanosis, or edema. Respiratory: Normal respiratory effort, no increased work of breathing. GI: Abdomen is soft, nontender, nondistended, no abdominal masses GU: No CVA tenderness.  Lymph: No cervical or inguinal lymphadenopathy. Skin: No rashes, bruises or suspicious lesions. Neurologic: Grossly intact, no focal deficits, moving all 4 extremities. Psychiatric: Normal mood and affect.  Laboratory Data: Lab Results  Component Value Date   WBC 8.6 06/26/2022   HGB 11.0 (L) 06/26/2022   HCT 32.5 (L) 06/26/2022   MCV 87.1 06/26/2022   PLT 160 06/26/2022    Lab Results  Component Value Date   CREATININE 1.05 06/27/2022    No results found for: "PSA"  No results found for: "TESTOSTERONE"  No results found for: "HGBA1C"  Urinalysis    Component Value Date/Time   COLORURINE YELLOW 06/24/2022 1326   [place] 06/24/2022 1326   APPEARANCEUR Clear 04/15/2022 1146   LABSPEC 1.036 (H) 06/24/2022 1326   PHURINE 6.0 06/24/2022 1326   GLUCOSEU NEGATIVE 06/24/2022 1326   HGBUR NEGATIVE 06/24/2022 1326   BILIRUBINUR NEGATIVE 06/24/2022 1326   BILIRUBINUR Negative 04/15/2022 1146   KETONESUR  NEGATIVE 06/24/2022 1326   PROTEINUR NEGATIVE 06/24/2022 1326   NITRITE NEGATIVE 06/24/2022 1326   LEUKOCYTESUR NEGATIVE 06/24/2022 1326    Lab Results  Component Value Date   LABMICR Comment 04/15/2022   WBCUA 0-5 10/12/2021   LABEPIT 0-10 10/12/2021   MUCUS Present 10/12/2021   BACTERIA None seen 10/12/2021    Pertinent Imaging: *** No results found for this or any previous visit.  No results found for this or any previous visit.  No results found for this or any previous visit.  No results found for this or any previous visit.  No results found for this or any previous visit.  No valid procedures specified. No results found for this or any previous visit.  No results found for  this or any previous visit.   Assessment & Plan:    1. Benign prostatic hyperplasia, unspecified whether lower urinary tract symptoms present ***  2. Nocturia ***  3. Prostate cancer (HCC) ***   No follow-ups on file.  Nicolette Bang, MD  [facility] [place]

## 2022-10-22 ENCOUNTER — Ambulatory Visit (HOSPITAL_COMMUNITY): Payer: Medicare Other | Attending: Student | Admitting: Physical Therapy

## 2022-10-22 DIAGNOSIS — M79604 Pain in right leg: Secondary | ICD-10-CM | POA: Diagnosis not present

## 2022-10-22 DIAGNOSIS — M6281 Muscle weakness (generalized): Secondary | ICD-10-CM

## 2022-10-22 DIAGNOSIS — R262 Difficulty in walking, not elsewhere classified: Secondary | ICD-10-CM | POA: Diagnosis present

## 2022-10-22 DIAGNOSIS — M545 Low back pain, unspecified: Secondary | ICD-10-CM
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
## 2022-10-24 ENCOUNTER — Ambulatory Visit (HOSPITAL_COMMUNITY): Payer: Medicare Other | Admitting: Physical Therapy

## 2022-10-24 DIAGNOSIS — M79604 Pain in right leg: Secondary | ICD-10-CM | POA: Diagnosis not present

## 2022-10-24 DIAGNOSIS — M6281 Muscle weakness (generalized): Secondary | ICD-10-CM

## 2022-10-24 DIAGNOSIS — R262 Difficulty in walking, not elsewhere classified: Secondary | ICD-10-CM

## 2022-10-24 DIAGNOSIS — M545 Low back pain, unspecified: Secondary | ICD-10-CM
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
## 2022-10-29 ENCOUNTER — Ambulatory Visit (HOSPITAL_COMMUNITY): Payer: Medicare Other | Admitting: Physical Therapy

## 2022-10-29 DIAGNOSIS — M6281 Muscle weakness (generalized): Secondary | ICD-10-CM

## 2022-10-29 DIAGNOSIS — M79604 Pain in right leg: Secondary | ICD-10-CM | POA: Diagnosis not present

## 2022-10-29 DIAGNOSIS — M545 Low back pain, unspecified: Secondary | ICD-10-CM

## 2022-10-29 DIAGNOSIS — R262 Difficulty in walking, not elsewhere classified: Secondary | ICD-10-CM
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
## 2022-10-30 DIAGNOSIS — N529 Male erectile dysfunction, unspecified: Secondary | ICD-10-CM | POA: Insufficient documentation

## 2022-10-30 DIAGNOSIS — M545 Low back pain, unspecified: Secondary | ICD-10-CM | POA: Insufficient documentation

## 2022-10-30 DIAGNOSIS — G8929 Other chronic pain: Secondary | ICD-10-CM | POA: Insufficient documentation

## 2022-10-30 DIAGNOSIS — I700 Atherosclerosis of aorta: Secondary | ICD-10-CM | POA: Insufficient documentation

## 2022-10-30 DIAGNOSIS — C61 Malignant neoplasm of prostate: Secondary | ICD-10-CM | POA: Insufficient documentation

## 2022-10-30 DIAGNOSIS — N401 Enlarged prostate with lower urinary tract symptoms: Secondary | ICD-10-CM | POA: Insufficient documentation

## 2022-10-30 DIAGNOSIS — R7303 Prediabetes: Secondary | ICD-10-CM | POA: Insufficient documentation

## 2022-10-31 ENCOUNTER — Ambulatory Visit (HOSPITAL_COMMUNITY): Payer: Medicare Other | Admitting: Physical Therapy

## 2022-10-31 DIAGNOSIS — R262 Difficulty in walking, not elsewhere classified: Secondary | ICD-10-CM

## 2022-10-31 DIAGNOSIS — M79604 Pain in right leg: Secondary | ICD-10-CM

## 2022-10-31 DIAGNOSIS — M6281 Muscle weakness (generalized): Secondary | ICD-10-CM
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
## 2022-11-05 ENCOUNTER — Ambulatory Visit (HOSPITAL_COMMUNITY): Payer: Medicare Other | Admitting: Physical Therapy

## 2022-11-05 DIAGNOSIS — M79604 Pain in right leg: Secondary | ICD-10-CM

## 2022-11-05 DIAGNOSIS — M6281 Muscle weakness (generalized): Secondary | ICD-10-CM

## 2022-11-05 DIAGNOSIS — M545 Low back pain, unspecified: Secondary | ICD-10-CM

## 2022-11-05 DIAGNOSIS — R262 Difficulty in walking, not elsewhere classified: Secondary | ICD-10-CM
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
## 2022-11-06 NOTE — Addendum Note (Signed)
Addended by: Bella Kennedy on: 11/06/2022 08:11 AM   Modules accepted: Orders

## 2022-11-07 ENCOUNTER — Ambulatory Visit (HOSPITAL_COMMUNITY): Payer: Medicare Other | Admitting: Physical Therapy

## 2022-11-07 DIAGNOSIS — M545 Low back pain, unspecified: Secondary | ICD-10-CM

## 2022-11-07 DIAGNOSIS — R262 Difficulty in walking, not elsewhere classified: Secondary | ICD-10-CM

## 2022-11-07 DIAGNOSIS — M79604 Pain in right leg: Secondary | ICD-10-CM

## 2022-11-07 DIAGNOSIS — M6281 Muscle weakness (generalized): Secondary | ICD-10-CM
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
## 2022-11-11 ENCOUNTER — Encounter (HOSPITAL_COMMUNITY): Payer: Medicare Other | Admitting: Physical Therapy

## 2022-11-13 ENCOUNTER — Ambulatory Visit (HOSPITAL_COMMUNITY): Payer: Medicare Other | Admitting: Physical Therapy

## 2022-11-13 ENCOUNTER — Encounter (HOSPITAL_COMMUNITY): Payer: Medicare Other | Admitting: Physical Therapy

## 2022-11-13 DIAGNOSIS — M79604 Pain in right leg: Secondary | ICD-10-CM

## 2022-11-13 DIAGNOSIS — M6281 Muscle weakness (generalized): Secondary | ICD-10-CM

## 2022-11-13 DIAGNOSIS — R262 Difficulty in walking, not elsewhere classified: Secondary | ICD-10-CM
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
## 2022-11-18 ENCOUNTER — Other Ambulatory Visit (HOSPITAL_COMMUNITY): Payer: Self-pay | Admitting: Internal Medicine

## 2022-11-18 DIAGNOSIS — F17200 Nicotine dependence, unspecified, uncomplicated: Secondary | ICD-10-CM

## 2022-11-20 ENCOUNTER — Ambulatory Visit (HOSPITAL_COMMUNITY): Payer: Medicare Other | Attending: Student

## 2022-11-20 ENCOUNTER — Encounter (HOSPITAL_COMMUNITY): Payer: Self-pay

## 2022-11-20 DIAGNOSIS — M6281 Muscle weakness (generalized): Secondary | ICD-10-CM | POA: Diagnosis present

## 2022-11-20 DIAGNOSIS — M545 Low back pain, unspecified: Secondary | ICD-10-CM | POA: Diagnosis present

## 2022-11-20 DIAGNOSIS — M79604 Pain in right leg: Secondary | ICD-10-CM

## 2022-11-20 DIAGNOSIS — R262 Difficulty in walking, not elsewhere classified: Secondary | ICD-10-CM
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
## 2022-11-22 ENCOUNTER — Ambulatory Visit (INDEPENDENT_AMBULATORY_CARE_PROVIDER_SITE_OTHER): Payer: Medicare Other | Admitting: Orthopedic Surgery

## 2022-11-22 ENCOUNTER — Encounter: Payer: Self-pay | Admitting: Orthopedic Surgery

## 2022-11-22 DIAGNOSIS — M19031 Primary osteoarthritis, right wrist: Secondary | ICD-10-CM

## 2022-11-22 MED ORDER — MELOXICAM 7.5 MG PO TABS: 7.5000 mg | ORAL_TABLET | Freq: Every day | ORAL | 5 refills | Status: AC
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
## 2022-11-28 ENCOUNTER — Ambulatory Visit (HOSPITAL_COMMUNITY): Payer: Medicare Other | Admitting: Physical Therapy

## 2022-11-28 DIAGNOSIS — M79604 Pain in right leg: Secondary | ICD-10-CM | POA: Diagnosis not present

## 2022-11-28 DIAGNOSIS — M6281 Muscle weakness (generalized): Secondary | ICD-10-CM

## 2022-11-28 DIAGNOSIS — R262 Difficulty in walking, not elsewhere classified: Secondary | ICD-10-CM

## 2022-11-28 DIAGNOSIS — M545 Low back pain, unspecified: Secondary | ICD-10-CM
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
## 2022-12-06 ENCOUNTER — Other Ambulatory Visit (HOSPITAL_COMMUNITY): Payer: Self-pay | Admitting: Internal Medicine

## 2022-12-06 DIAGNOSIS — F17200 Nicotine dependence, unspecified, uncomplicated: Secondary | ICD-10-CM

## 2022-12-06 DIAGNOSIS — F17210 Nicotine dependence, cigarettes, uncomplicated: Secondary | ICD-10-CM

## 2022-12-10 ENCOUNTER — Other Ambulatory Visit: Payer: Self-pay

## 2022-12-10 ENCOUNTER — Telehealth: Payer: Self-pay

## 2022-12-10 DIAGNOSIS — N400 Enlarged prostate without lower urinary tract symptoms: Secondary | ICD-10-CM

## 2022-12-10 MED ORDER — ALFUZOSIN HCL ER 10 MG PO TB24
10.0000 mg | ORAL_TABLET | Freq: Every day | ORAL | 0 refills | Status: DC
Start: 2022-12-10 — End: 2023-03-17

## 2022-12-10 NOTE — Telephone Encounter (Signed)
Patient called and advised they needed a refill on medication below.   Medication: alfuzosin (UROXATRAL) 10 MG 24 hr tablet    Pharmacy: [id number] - [place], [place] - [address] AT [address]    Thank you   

## 2022-12-10 NOTE — Telephone Encounter (Signed)
90 day rx sent patient scheduled to f/u with MD on 06/28

## 2023-01-03 ENCOUNTER — Encounter: Payer: Self-pay | Admitting: Orthopedic Surgery

## 2023-01-03 ENCOUNTER — Ambulatory Visit (INDEPENDENT_AMBULATORY_CARE_PROVIDER_SITE_OTHER): Payer: Medicare Other | Admitting: Orthopedic Surgery

## 2023-01-03 VITALS — BP 116/76 | HR 75 | Ht 76.0 in | Wt 200.0 lb

## 2023-01-03 DIAGNOSIS — M19031 Primary osteoarthritis, right wrist: Secondary | ICD-10-CM
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
## 2023-01-13 ENCOUNTER — Encounter (HOSPITAL_COMMUNITY): Payer: Self-pay

## 2023-01-13 ENCOUNTER — Ambulatory Visit (HOSPITAL_COMMUNITY): Payer: Medicare Other

## 2023-01-17 ENCOUNTER — Ambulatory Visit: Payer: Medicare Other | Admitting: Urology

## 2023-01-17 DIAGNOSIS — R972 Elevated prostate specific antigen [PSA]: Secondary | ICD-10-CM

## 2023-02-11 ENCOUNTER — Ambulatory Visit (HOSPITAL_COMMUNITY)
Admission: RE | Admit: 2023-02-11 | Discharge: 2023-02-11 | Disposition: A | Discharge status: Home / Self Care | Payer: Medicare Other | Source: Ambulatory Visit | Attending: Internal Medicine | Admitting: Internal Medicine

## 2023-02-11 DIAGNOSIS — F17200 Nicotine dependence, unspecified, uncomplicated: Secondary | ICD-10-CM | POA: Diagnosis not present

## 2023-02-11 DIAGNOSIS — Z122 Encounter for screening for malignant neoplasm of respiratory organs: Secondary | ICD-10-CM | POA: Diagnosis present

## 2023-02-11 DIAGNOSIS — F17210 Nicotine dependence, cigarettes, uncomplicated: Secondary | ICD-10-CM | POA: Insufficient documentation

## 2023-03-04 ENCOUNTER — Telehealth: Payer: Self-pay

## 2023-03-04 DIAGNOSIS — N5201 Erectile dysfunction due to arterial insufficiency: Secondary | ICD-10-CM

## 2023-03-04 MED ORDER — TADALAFIL 20 MG PO TABS
20.0000 mg | ORAL_TABLET | Freq: Every day | ORAL | 5 refills | Status: DC | PRN
Start: 2023-03-04 — End: 2023-07-01

## 2023-03-04 NOTE — Telephone Encounter (Signed)
Patient called for refill for tadalafil. Rx sent to pharmacy

## 2023-03-07 ENCOUNTER — Ambulatory Visit (HOSPITAL_COMMUNITY): Payer: Medicare Other | Admitting: Physical Therapy

## 2023-03-17 ENCOUNTER — Other Ambulatory Visit: Payer: Self-pay

## 2023-03-17 DIAGNOSIS — N400 Enlarged prostate without lower urinary tract symptoms: Secondary | ICD-10-CM

## 2023-03-17 MED ORDER — ALFUZOSIN HCL ER 10 MG PO TB24
10.0000 mg | ORAL_TABLET | Freq: Every day | ORAL | 0 refills | Status: DC
Start: 2023-03-17 — End: 2023-04-16

## 2023-03-21 ENCOUNTER — Ambulatory Visit (HOSPITAL_COMMUNITY): Payer: Medicare Other

## 2023-03-21 ENCOUNTER — Ambulatory Visit: Payer: Medicare Other

## 2023-03-25 ENCOUNTER — Ambulatory Visit: Payer: Medicare Other

## 2023-03-25 DIAGNOSIS — C61 Malignant neoplasm of prostate: Secondary | ICD-10-CM

## 2023-03-26 LAB — PSA: Prostate Specific Ag, Serum: 6.6 ng/mL — ABNORMAL HIGH (ref 0.0–4.0)

## 2023-03-28 ENCOUNTER — Ambulatory Visit: Payer: Medicare Other | Admitting: Urology

## 2023-04-04 ENCOUNTER — Other Ambulatory Visit: Payer: Self-pay

## 2023-04-04 ENCOUNTER — Ambulatory Visit (HOSPITAL_COMMUNITY): Payer: Medicare Other | Attending: Student

## 2023-04-04 DIAGNOSIS — R262 Difficulty in walking, not elsewhere classified: Secondary | ICD-10-CM | POA: Diagnosis present

## 2023-04-04 DIAGNOSIS — M6281 Muscle weakness (generalized): Secondary | ICD-10-CM | POA: Insufficient documentation

## 2023-04-04 DIAGNOSIS — M79604 Pain in right leg: Secondary | ICD-10-CM | POA: Diagnosis present

## 2023-04-04 DIAGNOSIS — M545 Low back pain, unspecified: Secondary | ICD-10-CM | POA: Diagnosis present
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
## 2023-04-07 ENCOUNTER — Ambulatory Visit: Payer: Medicare Other

## 2023-04-08 NOTE — Progress Notes (Signed)
Letter sent.

## 2023-04-08 NOTE — Addendum Note (Signed)
Addended by: Seymour Bars on: 04/08/2023 02:58 PM   Modules accepted: Orders

## 2023-04-11 ENCOUNTER — Ambulatory Visit (HOSPITAL_COMMUNITY): Payer: Medicare Other

## 2023-04-11 ENCOUNTER — Encounter (HOSPITAL_COMMUNITY): Payer: Self-pay

## 2023-04-11 ENCOUNTER — Encounter (HOSPITAL_COMMUNITY): Payer: Medicare Other

## 2023-04-11 DIAGNOSIS — R262 Difficulty in walking, not elsewhere classified: Secondary | ICD-10-CM | POA: Diagnosis not present

## 2023-04-11 DIAGNOSIS — M79604 Pain in right leg: Secondary | ICD-10-CM

## 2023-04-11 DIAGNOSIS — M6281 Muscle weakness (generalized): Secondary | ICD-10-CM
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
## 2023-04-15 ENCOUNTER — Ambulatory Visit (HOSPITAL_COMMUNITY): Payer: Medicare Other

## 2023-04-15 DIAGNOSIS — R262 Difficulty in walking, not elsewhere classified: Secondary | ICD-10-CM

## 2023-04-15 DIAGNOSIS — M79604 Pain in right leg: Secondary | ICD-10-CM

## 2023-04-15 DIAGNOSIS — M6281 Muscle weakness (generalized): Secondary | ICD-10-CM
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
## 2023-04-16 ENCOUNTER — Ambulatory Visit: Payer: Medicare Other | Admitting: Urology

## 2023-04-16 VITALS — BP 105/67 | HR 84

## 2023-04-16 DIAGNOSIS — R351 Nocturia: Secondary | ICD-10-CM

## 2023-04-16 DIAGNOSIS — C61 Malignant neoplasm of prostate: Secondary | ICD-10-CM

## 2023-04-16 DIAGNOSIS — N400 Enlarged prostate without lower urinary tract symptoms: Secondary | ICD-10-CM | POA: Diagnosis not present

## 2023-04-16 MED ORDER — ALFUZOSIN HCL ER 10 MG PO TB24
10.0000 mg | ORAL_TABLET | Freq: Every day | ORAL | 3 refills | Status: DC
Start: 2023-04-16 — End: 2023-10-15

## 2023-04-16 NOTE — Progress Notes (Signed)
04/16/2023 3:41 PM   Charles Evans [birth year] [id number]  Referring provider: Benita Stabile, MD [address] [place],  [place] [address]  No chief complaint on file.   HPI: Charles Evans is a 66yo here for followup for prostate cancer and BPH. PSA decreased to 6.6 from 9.8. IPSS 20 QOL 3. Nocturia 2x. Urine stream improved. He has rare urinary hesitancy.   PMH: Past Medical History:  Diagnosis Date   Hypertension     Surgical History: Past Surgical History:  Procedure Laterality Date   BACK SURGERY     COLONOSCOPY N/A 08/21/2015   Procedure: COLONOSCOPY;  Surgeon: West Bali, MD;  [facility];  Service: Endoscopy;  Laterality: N/A;  1430   ESOPHAGOGASTRODUODENOSCOPY N/A 08/21/2015   Procedure: ESOPHAGOGASTRODUODENOSCOPY (EGD);  Surgeon: West Bali, MD;  [facility]: [facility];  Service: Endoscopy;  Laterality: N/A;   FEMUR IM NAIL Right 06/24/2022   Procedure: INTRAMEDULLARY (IM) RETROGRADE FEMORAL NAILING;  Surgeon: Roby Lofts, MD;  Location: [facility];  Service: Orthopedics;  Laterality: Right;   HERNIA REPAIR     left groin   LUMBAR LAMINECTOMY/DECOMPRESSION MICRODISCECTOMY Left 09/28/2018   Procedure: Microdiscectomy - L3-L4 - left;  Surgeon: Donalee Citrin, MD;  Location: [facility] OR;  Service: Neurosurgery;  Laterality: Left;  Microdiscectomy - L3-L4 - left    Home Medications:  Allergies as of 04/16/2023       Reactions   Cyclobenzaprine Swelling   Decadron [dexamethasone] Swelling, Other (See Comments)   Severe hiccups lasting 15 days   Penicillins Swelling, Rash, Other (See Comments)   Did it involve swelling of the face/tongue/throat, SOB, or low BP? Yes Did it involve sudden or severe rash/hives, skin peeling, or any reaction on the inside of your mouth or nose? No Did you need to seek medical attention at a hospital or doctor's office? No When did it last happen?      year  If all above answers are "NO", may proceed with cephalosporin use.         Medication List        Accurate as of April 16, 2023  3:41 PM. If you have any questions, ask your nurse or doctor.          acetaminophen 500 MG tablet Commonly known as: TYLENOL Take 1-2 tablets (500-1,000 mg total) by mouth every 6 (six) hours as needed for mild pain or moderate pain.   alfuzosin 10 MG 24 hr tablet Commonly known as: UROXATRAL Take 1 tablet (10 mg total) by mouth daily with breakfast.   amLODipine 10 MG tablet Commonly known as: NORVASC Take 10 mg by mouth daily.   HYDROcodone-acetaminophen 7.5-325 MG tablet Commonly known as: NORCO TAKE 1 TABLET BY MOUTH EVERY 6 HOURS (MAX OF 5 DAYS)   lisinopril-hydrochlorothiazide 20-25 MG tablet Commonly known as: ZESTORETIC Take 1 tablet by mouth daily.   meloxicam 7.5 MG tablet Commonly known as: Mobic Take 1 tablet (7.5 mg total) by mouth daily.   metFORMIN 500 MG tablet Commonly known as: GLUCOPHAGE Take 500 mg by mouth daily.   methocarbamol 500 MG tablet Commonly known as: ROBAXIN Take 1 tablet (500 mg total) by mouth every 6 (six) hours as needed for muscle spasms.   oxymetazoline 0.05 % nasal spray Commonly known as: AFRIN Place 1 spray into both nostrils 2 (two) times daily as needed for congestion.   potassium chloride SA 20 MEQ tablet Commonly known as: KLOR-CON M Take 1  tablet (20 mEq total) by mouth 2 (two) times daily for 5 days.   tadalafil 20 MG tablet Commonly known as: CIALIS Take 1 tablet (20 mg total) by mouth daily as needed.   tiZANidine 2 MG tablet Commonly known as: ZANAFLEX   traZODone 50 MG tablet Commonly known as: DESYREL Take 1 tablet every day by oral route at bedtime.        Allergies:  Allergies  Allergen Reactions   Cyclobenzaprine Swelling   Decadron [Dexamethasone] Swelling and Other (See Comments)    Severe hiccups lasting 15 days   Penicillins Swelling, Rash and Other (See Comments)    Did it involve swelling of the face/tongue/throat, SOB,  or low BP? Yes Did it involve sudden or severe rash/hives, skin peeling, or any reaction on the inside of your mouth or nose? No Did you need to seek medical attention at a hospital or doctor's office? No When did it last happen?      year  If all above answers are "NO", may proceed with cephalosporin use.      Family History: Family History  Problem Relation Age of Onset   Heart disease Father    Colon cancer Cousin        less than 49    Social History:  reports that he has been smoking cigarettes. He has a 20 pack-year smoking history. He has never used smokeless tobacco. He reports that he does not drink alcohol and does not use drugs.  ROS: All other review of systems were reviewed and are negative except what is noted above in HPI  Physical Exam: BP 105/67   Pulse 84   Constitutional:  Alert and oriented, No acute distress. HEENT: [place] AT, moist mucus membranes.  Trachea midline, no masses. Cardiovascular: No clubbing, cyanosis, or edema. Respiratory: Normal respiratory effort, no increased work of breathing. GI: Abdomen is soft, nontender, nondistended, no abdominal masses GU: No CVA tenderness.  Lymph: No cervical or inguinal lymphadenopathy. Skin: No rashes, bruises or suspicious lesions. Neurologic: Grossly intact, no focal deficits, moving all 4 extremities. Psychiatric: Normal mood and affect.  Laboratory Data: Lab Results  Component Value Date   WBC 8.6 06/26/2022   HGB 11.0 (L) 06/26/2022   HCT 32.5 (L) 06/26/2022   MCV 87.1 06/26/2022   PLT 160 06/26/2022    Lab Results  Component Value Date   CREATININE 1.05 06/27/2022    No results found for: "PSA"  No results found for: "TESTOSTERONE"  No results found for: "HGBA1C"  Urinalysis    Component Value Date/Time   COLORURINE YELLOW 06/24/2022 1326   APPEARANCEUR Clear 10/16/2022 1408   LABSPEC 1.036 (H) 06/24/2022 1326   PHURINE 6.0 06/24/2022 1326   GLUCOSEU Negative 10/16/2022 1408   HGBUR  NEGATIVE 06/24/2022 1326   BILIRUBINUR Negative 10/16/2022 1408   KETONESUR NEGATIVE 06/24/2022 1326   PROTEINUR Negative 10/16/2022 1408   PROTEINUR NEGATIVE 06/24/2022 1326   NITRITE Negative 10/16/2022 1408   NITRITE NEGATIVE 06/24/2022 1326   LEUKOCYTESUR Negative 10/16/2022 1408   LEUKOCYTESUR NEGATIVE 06/24/2022 1326    Lab Results  Component Value Date   LABMICR See below: 10/16/2022   WBCUA 0-5 10/16/2022   LABEPIT 0-10 10/16/2022   MUCUS Present 10/12/2021   BACTERIA None seen 10/16/2022    Pertinent Imaging:  No results found for this or any previous visit.  No results found for this or any previous visit.  No results found for this or any previous visit.  No results  found for this or any previous visit.  No results found for this or any previous visit.  No valid procedures specified. No results found for this or any previous visit.  No results found for this or any previous visit.   Assessment & Plan:    1. Benign prostatic hyperplasia, unspecified whether lower urinary tract symptoms present -uroxatral 10mg  qhs - Urinalysis, Routine w reflex microscopic  2. Nocturia Uroxatral 10mg  qhs  3. Prostate cancer (HCC) -followup 6 months with PSA   No follow-ups on file.  Wilkie Aye, MD  [facility] [place]

## 2023-04-17 LAB — URINALYSIS, ROUTINE W REFLEX MICROSCOPIC
Bilirubin, UA: NEGATIVE
Ketones, UA: NEGATIVE
Leukocytes,UA: NEGATIVE
Nitrite, UA: NEGATIVE
Protein,UA: NEGATIVE
RBC, UA: NEGATIVE
Specific Gravity, UA: 1.03 (ref 1.005–1.030)
Urobilinogen, Ur: 1 mg/dL (ref 0.2–1.0)
pH, UA: 6 (ref 5.0–7.5)

## 2023-04-18 ENCOUNTER — Encounter (HOSPITAL_COMMUNITY): Payer: Medicare Other | Admitting: Physical Therapy

## 2023-04-18 ENCOUNTER — Telehealth (HOSPITAL_COMMUNITY): Payer: Self-pay | Admitting: Physical Therapy
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
## 2023-04-18 NOTE — Telephone Encounter (Signed)
Called pt in regards to missed appointment #1. Pt reports forgetting the time. States he would like to reschedule on another day; however, pt is already scheduled twice for next week and his POC is for 2x/wk. Reminded pt of his next appointment time and the clinic's attendance policy.  Harlem Thresher April Ma Alphonsa Overall, PT

## 2023-04-20 ENCOUNTER — Encounter: Payer: Self-pay | Admitting: Urology

## 2023-04-20 NOTE — Patient Instructions (Signed)

## 2023-04-22 ENCOUNTER — Ambulatory Visit (HOSPITAL_COMMUNITY): Payer: Medicare Other | Attending: Student | Admitting: Physical Therapy

## 2023-04-22 DIAGNOSIS — M6281 Muscle weakness (generalized): Secondary | ICD-10-CM | POA: Insufficient documentation

## 2023-04-22 DIAGNOSIS — R262 Difficulty in walking, not elsewhere classified: Secondary | ICD-10-CM | POA: Insufficient documentation

## 2023-04-22 DIAGNOSIS — M79604 Pain in right leg: Secondary | ICD-10-CM | POA: Insufficient documentation

## 2023-04-22 DIAGNOSIS — M545 Low back pain, unspecified: Secondary | ICD-10-CM | POA: Insufficient documentation
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
## 2023-04-25 ENCOUNTER — Ambulatory Visit (HOSPITAL_COMMUNITY): Payer: Medicare Other

## 2023-04-25 DIAGNOSIS — M79604 Pain in right leg: Secondary | ICD-10-CM

## 2023-04-25 DIAGNOSIS — M545 Low back pain, unspecified: Secondary | ICD-10-CM

## 2023-04-25 DIAGNOSIS — R262 Difficulty in walking, not elsewhere classified: Secondary | ICD-10-CM

## 2023-04-25 DIAGNOSIS — M6281 Muscle weakness (generalized): Secondary | ICD-10-CM
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
## 2023-04-29 ENCOUNTER — Ambulatory Visit (HOSPITAL_COMMUNITY): Payer: Medicare Other

## 2023-04-29 DIAGNOSIS — M545 Low back pain, unspecified: Secondary | ICD-10-CM

## 2023-04-29 DIAGNOSIS — M79604 Pain in right leg: Secondary | ICD-10-CM | POA: Diagnosis not present

## 2023-04-29 DIAGNOSIS — R262 Difficulty in walking, not elsewhere classified: Secondary | ICD-10-CM

## 2023-04-29 DIAGNOSIS — M6281 Muscle weakness (generalized): Secondary | ICD-10-CM
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
## 2023-05-01 ENCOUNTER — Other Ambulatory Visit (HOSPITAL_COMMUNITY): Payer: Self-pay | Admitting: Family Medicine

## 2023-05-01 DIAGNOSIS — R634 Abnormal weight loss: Secondary | ICD-10-CM

## 2023-05-02 ENCOUNTER — Encounter (HOSPITAL_COMMUNITY): Payer: Medicare Other

## 2023-05-05 ENCOUNTER — Other Ambulatory Visit (HOSPITAL_COMMUNITY): Payer: Self-pay | Admitting: Family Medicine

## 2023-05-05 DIAGNOSIS — R634 Abnormal weight loss: Secondary | ICD-10-CM

## 2023-05-05 DIAGNOSIS — R918 Other nonspecific abnormal finding of lung field: Secondary | ICD-10-CM

## 2023-05-06 ENCOUNTER — Ambulatory Visit (HOSPITAL_COMMUNITY): Payer: Medicare Other

## 2023-05-06 DIAGNOSIS — M79604 Pain in right leg: Secondary | ICD-10-CM | POA: Diagnosis not present

## 2023-05-06 DIAGNOSIS — R262 Difficulty in walking, not elsewhere classified: Secondary | ICD-10-CM

## 2023-05-06 DIAGNOSIS — M545 Low back pain, unspecified: Secondary | ICD-10-CM
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
## 2023-05-09 ENCOUNTER — Encounter (HOSPITAL_COMMUNITY): Payer: Medicare Other

## 2023-05-13 ENCOUNTER — Ambulatory Visit (HOSPITAL_COMMUNITY): Payer: Medicare Other

## 2023-05-13 DIAGNOSIS — R262 Difficulty in walking, not elsewhere classified: Secondary | ICD-10-CM

## 2023-05-13 DIAGNOSIS — M6281 Muscle weakness (generalized): Secondary | ICD-10-CM

## 2023-05-13 DIAGNOSIS — M79604 Pain in right leg: Secondary | ICD-10-CM

## 2023-05-13 DIAGNOSIS — M545 Low back pain, unspecified: Secondary | ICD-10-CM
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
## 2023-05-14 ENCOUNTER — Encounter (HOSPITAL_COMMUNITY): Payer: Self-pay

## 2023-05-14 ENCOUNTER — Ambulatory Visit (HOSPITAL_COMMUNITY)
Admission: RE | Admit: 2023-05-14 | Discharge: 2023-05-14 | Disposition: A | Discharge status: Home / Self Care | Payer: Medicare Other | Source: Ambulatory Visit | Attending: Family Medicine | Admitting: Family Medicine

## 2023-05-14 DIAGNOSIS — R634 Abnormal weight loss: Secondary | ICD-10-CM | POA: Diagnosis present

## 2023-05-14 DIAGNOSIS — R918 Other nonspecific abnormal finding of lung field: Secondary | ICD-10-CM | POA: Insufficient documentation

## 2023-05-14 MED ORDER — IOHEXOL 300 MG/ML  SOLN
100.0000 mL | Freq: Once | INTRAMUSCULAR | Status: AC | PRN
  Administered 2023-05-14: 100 mL via INTRAVENOUS

## 2023-05-16 ENCOUNTER — Ambulatory Visit (HOSPITAL_COMMUNITY): Payer: Medicare Other

## 2023-05-16 DIAGNOSIS — M6281 Muscle weakness (generalized): Secondary | ICD-10-CM

## 2023-05-16 DIAGNOSIS — M79604 Pain in right leg: Secondary | ICD-10-CM

## 2023-05-16 DIAGNOSIS — M545 Low back pain, unspecified: Secondary | ICD-10-CM

## 2023-05-16 DIAGNOSIS — R262 Difficulty in walking, not elsewhere classified: Secondary | ICD-10-CM
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
## 2023-05-20 ENCOUNTER — Encounter (HOSPITAL_COMMUNITY): Payer: Medicare Other

## 2023-05-23 ENCOUNTER — Encounter (HOSPITAL_COMMUNITY): Payer: Medicare Other

## 2023-06-03 ENCOUNTER — Inpatient Hospital Stay: Payer: Medicare Other | Attending: Oncology | Admitting: Oncology

## 2023-06-03 ENCOUNTER — Inpatient Hospital Stay: Payer: Medicare Other

## 2023-06-03 ENCOUNTER — Encounter: Payer: Self-pay | Admitting: Oncology

## 2023-06-03 VITALS — BP 116/71 | HR 68 | Temp 98.1°F | Resp 18 | Ht 76.0 in | Wt 182.6 lb

## 2023-06-03 DIAGNOSIS — C61 Malignant neoplasm of prostate: Secondary | ICD-10-CM

## 2023-06-03 DIAGNOSIS — R634 Abnormal weight loss: Secondary | ICD-10-CM | POA: Diagnosis not present

## 2023-06-03 DIAGNOSIS — Z79899 Other long term (current) drug therapy: Secondary | ICD-10-CM | POA: Diagnosis not present

## 2023-06-03 DIAGNOSIS — K5730 Diverticulosis of large intestine without perforation or abscess without bleeding: Secondary | ICD-10-CM | POA: Diagnosis not present

## 2023-06-03 DIAGNOSIS — Z720 Tobacco use: Secondary | ICD-10-CM | POA: Insufficient documentation

## 2023-06-03 DIAGNOSIS — D171 Benign lipomatous neoplasm of skin and subcutaneous tissue of trunk: Secondary | ICD-10-CM | POA: Insufficient documentation

## 2023-06-03 DIAGNOSIS — F17210 Nicotine dependence, cigarettes, uncomplicated: Secondary | ICD-10-CM | POA: Insufficient documentation

## 2023-06-03 DIAGNOSIS — I2510 Atherosclerotic heart disease of native coronary artery without angina pectoris: Secondary | ICD-10-CM | POA: Diagnosis not present

## 2023-06-03 DIAGNOSIS — I700 Atherosclerosis of aorta: Secondary | ICD-10-CM | POA: Insufficient documentation

## 2023-06-03 DIAGNOSIS — R222 Localized swelling, mass and lump, trunk: Secondary | ICD-10-CM | POA: Insufficient documentation

## 2023-06-03 DIAGNOSIS — J432 Centrilobular emphysema: Secondary | ICD-10-CM | POA: Diagnosis not present

## 2023-06-03 DIAGNOSIS — R978 Other abnormal tumor markers: Secondary | ICD-10-CM

## 2023-06-03 DIAGNOSIS — Z809 Family history of malignant neoplasm, unspecified: Secondary | ICD-10-CM | POA: Diagnosis not present

## 2023-06-03 DIAGNOSIS — R1900 Intra-abdominal and pelvic swelling, mass and lump, unspecified site: Secondary | ICD-10-CM

## 2023-06-03 DIAGNOSIS — R935 Abnormal findings on diagnostic imaging of other abdominal regions, including retroperitoneum: Secondary | ICD-10-CM | POA: Insufficient documentation

## 2023-06-03 DIAGNOSIS — R9389 Abnormal findings on diagnostic imaging of other specified body structures: Secondary | ICD-10-CM

## 2023-06-03 DIAGNOSIS — R972 Elevated prostate specific antigen [PSA]: Secondary | ICD-10-CM | POA: Diagnosis not present

## 2023-06-03 DIAGNOSIS — I10 Essential (primary) hypertension: Secondary | ICD-10-CM | POA: Insufficient documentation

## 2023-06-03 DIAGNOSIS — R590 Localized enlarged lymph nodes: Secondary | ICD-10-CM | POA: Diagnosis not present

## 2023-06-03 LAB — CBC WITH DIFFERENTIAL/PLATELET
Abs Immature Granulocytes: 0.01 10*3/uL (ref 0.00–0.07)
Basophils Absolute: 0 10*3/uL (ref 0.0–0.1)
Basophils Relative: 1 %
Eosinophils Absolute: 0.1 10*3/uL (ref 0.0–0.5)
Eosinophils Relative: 2 %
HCT: 44.5 % (ref 39.0–52.0)
Hemoglobin: 14.7 g/dL (ref 13.0–17.0)
Immature Granulocytes: 0 %
Lymphocytes Relative: 44 %
Lymphs Abs: 2.9 10*3/uL (ref 0.7–4.0)
MCH: 28 pg (ref 26.0–34.0)
MCHC: 33 g/dL (ref 30.0–36.0)
MCV: 84.8 fL (ref 80.0–100.0)
Monocytes Absolute: 0.5 10*3/uL (ref 0.1–1.0)
Monocytes Relative: 8 %
Neutro Abs: 3.1 10*3/uL (ref 1.7–7.7)
Neutrophils Relative %: 45 %
Platelets: 228 10*3/uL (ref 150–400)
RBC: 5.25 MIL/uL (ref 4.22–5.81)
RDW: 14.9 % (ref 11.5–15.5)
WBC: 6.6 10*3/uL (ref 4.0–10.5)
nRBC: 0 % (ref 0.0–0.2)

## 2023-06-03 LAB — COMPREHENSIVE METABOLIC PANEL
ALT: 22 U/L (ref 0–44)
AST: 19 U/L (ref 15–41)
Albumin: 3.5 g/dL (ref 3.5–5.0)
Alkaline Phosphatase: 100 U/L (ref 38–126)
Anion gap: 8 (ref 5–15)
BUN: 11 mg/dL (ref 8–23)
CO2: 25 mmol/L (ref 22–32)
Calcium: 8.9 mg/dL (ref 8.9–10.3)
Chloride: 104 mmol/L (ref 98–111)
Creatinine, Ser: 0.82 mg/dL (ref 0.61–1.24)
GFR, Estimated: >60 mL/min (ref >60)
Glucose, Bld: 101 mg/dL — ABNORMAL HIGH (ref 70–99)
Potassium: 3.7 mmol/L (ref 3.5–5.1)
Sodium: 137 mmol/L (ref 135–145)
Total Bilirubin: 0.6 mg/dL (ref <1.2)
Total Protein: 7.1 g/dL (ref 6.5–8.1)

## 2023-06-03 LAB — LACTATE DEHYDROGENASE: LDH: 107 U/L (ref 98–192)

## 2023-06-03 LAB — T4, FREE: Free T4: 1.81 ng/dL — ABNORMAL HIGH (ref 0.61–1.12)

## 2023-06-03 LAB — TSH: TSH: <0.01 u[IU]/mL — ABNORMAL LOW (ref 0.350–4.500)

## 2023-06-03 LAB — URIC ACID: Uric Acid, Serum: 6 mg/dL (ref 3.7–8.6)

## 2023-06-03 NOTE — Assessment & Plan Note (Signed)
CT scan revealed abnormal omental thickening and a mediastinal mass. History of low-grade prostate cancer with recent elevated PSA. Differential diagnosis includes metastatic prostate cancer, new primary malignancy, or lymphoma. -Order PET scan to identify potential sites of malignancy. -Schedule biopsy of omental mass for histopathological diagnosis. -Obtain labs including TSH, thyroglobulin, AFP, beta-hCG, CA 19-9 and CEA-workup for cancer of unknown primary  Return to clinic in 3 weeks to discuss diagnosis and prognosis and further management

## 2023-06-03 NOTE — Patient Instructions (Signed)
[facility]  Discharge Instructions  You were seen and examined today by Dr. Anders Simmonds. Dr. Anders Simmonds is a medical oncologist, meaning that he specializes in the treatment of cancer diagnoses. Dr. Anders Simmonds discussed your past medical history, family history of cancers, and the events that led to you being here today.  You were referred to Dr. Anders Simmonds due to an abnormal CT scan which revealed caking on the lining of your abdomen known as the omentum, enlarged lymph nodes and a mass in your chest (also called mediastinum). This is concerning for cancer but it unclear exactly what type of cancer it is and where it started.  Dr. Anders Simmonds has recommended a few things moving forward: PET scan. A PET scan is a specialized CT scan which illuminates where there is cancer present in the body. A biopsy of the omental caking to identify the type of cancer. Additional labs today  Dr. Anders Simmonds will see you one week after the biopsy.  Thank you for choosing [facility] to provide your oncology and hematology care.   To afford each patient quality time with our provider, please arrive at least 15 minutes before your scheduled appointment time. You may need to reschedule your appointment if you arrive late (10 or more minutes). Arriving late affects you and other patients whose appointments are after yours.  Also, if you miss three or more appointments without notifying the office, you may be dismissed from the clinic at the provider's discretion.    Again, thank you for choosing [facility].  Our hope is that these requests will decrease the amount of time that you wait before being seen by our physicians.   If you have a lab appointment with the Cancer Center - please note that after April 8th, all labs will be drawn in the cancer center.  You do not have to check in or register with the main entrance as you have in the past but will complete your  check-in at the cancer center.            _____________________________________________________________  Should you have questions after your visit to [facility], please contact our office at [phone] and follow the prompts.  Our office hours are 8:00 a.m. to 4:30 p.m. Monday - Thursday and 8:00 a.m. to 2:30 p.m. Friday.  Please note that voicemails left after 4:00 p.m. may not be returned until the following business day.  We are closed weekends and all major holidays.  You do have access to a nurse 24-7, just call the main number to the clinic [phone] and do not press any options, hold on the line and a nurse will answer the phone.    For prescription refill requests, have your pharmacy contact our office and allow 72 hours.    Masks are no longer required in the cancer centers. If you would like for your care team to wear a mask while they are taking care of you, please let them know. You may have one support person who is at least 66 years old accompany you for your appointments.
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
## 2023-06-03 NOTE — Assessment & Plan Note (Addendum)
Initially diagnosed in 2023.  Prostatic adenocarcinoma with a Gleason score of 3+3 equal to 6.5% of 1 core.  Also has a history of BPH.  Reviewed latest PSA of 8.4.  Being managed by Dr. Ronne Binning with urology. -Repeat PSA today -Will obtain a PET scan

## 2023-06-03 NOTE — Progress Notes (Unsigned)
Gilmer Mor, DO  Harle Stanford, Ryan Palermo There is no omental target for biopsy. We can remove this request. I have informed the referring doctor. Thank you JW       Previous Messages    ----- Message ----- From: Claudean Kinds Sent: 06/03/2023  11:52 AM EST To: Caroleen Hamman, NT; Claudean Kinds; * Subject: CT Biopsy                                      Procedure: CT Biopsy  Reason: omental caking, history of prostate cancer Dx: Primary malignant neoplasm of prostate (HCC) [place] (ICD-10-CM)]; Abnormal CT scan [R93.89 (ICD-10-CM)]    History: Ct Chest abd pelv w/ contrast  Provider : Cindie Crumbly, MD  Provider contact :  [phone]

## 2023-06-03 NOTE — Assessment & Plan Note (Signed)
Long history of smoking, approximately 1 pack per day for 40-50 years. Stopped yesterday. -Encourage continued smoking cessation due to increased risk of cancer progression and decreased efficacy of potential treatments.

## 2023-06-04 LAB — CANCER ANTIGEN 19-9: CA 19-9: <2 U/mL (ref 0–35)

## 2023-06-04 LAB — BETA HCG QUANT (REF LAB): hCG Quant: <1 m[IU]/mL (ref 0–3)

## 2023-06-04 LAB — CEA: CEA: 2.7 ng/mL (ref 0.0–4.7)

## 2023-06-04 LAB — AFP TUMOR MARKER: AFP, Serum, Tumor Marker: 2 ng/mL (ref 0.0–8.4)

## 2023-06-05 ENCOUNTER — Encounter (HOSPITAL_COMMUNITY)
Admission: RE | Admit: 2023-06-05 | Discharge: 2023-06-05 | Disposition: A | Discharge status: Home / Self Care | Payer: Medicare Other | Source: Ambulatory Visit | Attending: Oncology | Admitting: Oncology

## 2023-06-05 DIAGNOSIS — C61 Malignant neoplasm of prostate: Secondary | ICD-10-CM | POA: Insufficient documentation

## 2023-06-05 DIAGNOSIS — R9389 Abnormal findings on diagnostic imaging of other specified body structures: Secondary | ICD-10-CM | POA: Insufficient documentation

## 2023-06-05 DIAGNOSIS — R1900 Intra-abdominal and pelvic swelling, mass and lump, unspecified site: Secondary | ICD-10-CM | POA: Diagnosis present

## 2023-06-05 MED ORDER — FLUDEOXYGLUCOSE F - 18 (FDG) INJECTION
8.7900 | Freq: Once | INTRAVENOUS | Status: AC | PRN
  Administered 2023-06-05: 8.79 via INTRAVENOUS

## 2023-06-06 ENCOUNTER — Other Ambulatory Visit: Payer: Self-pay

## 2023-06-06 DIAGNOSIS — R9389 Abnormal findings on diagnostic imaging of other specified body structures: Secondary | ICD-10-CM

## 2023-06-06 DIAGNOSIS — C61 Malignant neoplasm of prostate: Secondary | ICD-10-CM

## 2023-06-18 ENCOUNTER — Telehealth: Payer: Self-pay | Admitting: Emergency Medicine

## 2023-06-18 NOTE — Telephone Encounter (Signed)
Urgent referral received from Dr Anders Simmonds. They are requesting a Medistinal biopsy. PET scan completed 06/05/23-images in PACS. No openings with [facility] until 1/22. Please advise if patient is okay to wait or if we need to work in sooner with SG or as a double booking on MD schedule.

## 2023-06-20 LAB — THYROGLOBULIN LEVEL: Thyroglobulin: 18 ng/mL

## 2023-06-23 NOTE — Telephone Encounter (Signed)
Agree with schedule SG or double w RB or BI if SG cannot see first

## 2023-06-26 ENCOUNTER — Inpatient Hospital Stay: Payer: 59 | Admitting: Oncology

## 2023-06-27 ENCOUNTER — Other Ambulatory Visit: Payer: Self-pay | Admitting: Urology

## 2023-06-27 DIAGNOSIS — N5201 Erectile dysfunction due to arterial insufficiency: Secondary | ICD-10-CM

## 2023-06-30 ENCOUNTER — Encounter: Payer: Self-pay | Admitting: Pulmonary Disease

## 2023-06-30 ENCOUNTER — Ambulatory Visit: Payer: Medicare Other | Admitting: Pulmonary Disease

## 2023-06-30 VITALS — BP 126/68 | HR 77 | Ht 76.0 in | Wt 178.0 lb

## 2023-06-30 DIAGNOSIS — R9389 Abnormal findings on diagnostic imaging of other specified body structures: Secondary | ICD-10-CM | POA: Diagnosis not present

## 2023-06-30 DIAGNOSIS — J9859 Other diseases of mediastinum, not elsewhere classified: Secondary | ICD-10-CM | POA: Diagnosis not present

## 2023-06-30 DIAGNOSIS — F17200 Nicotine dependence, unspecified, uncomplicated: Secondary | ICD-10-CM | POA: Diagnosis not present

## 2023-06-30 NOTE — Patient Instructions (Addendum)
Thank you for visiting Dr. Tonia Brooms at [facility]. Today we recommend the following:  Orders Placed This Encounter  Procedures   CT Chest Wo Contrast    Return in about 6 months (around 12/29/2023) for with [facility] or Dr. Tonia Brooms, after CT Chest.    Please do your part to reduce the spread of COVID-19.
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
## 2023-07-29 ENCOUNTER — Telehealth: Payer: Self-pay | Admitting: Urology

## 2023-07-29 DIAGNOSIS — N5201 Erectile dysfunction due to arterial insufficiency: Secondary | ICD-10-CM

## 2023-07-29 NOTE — Telephone Encounter (Signed)
 Needs 90 day supply of Tadalafil to [facility] [place]

## 2023-07-29 NOTE — Telephone Encounter (Signed)
 Rx written for 20 tablets PO prn.  Ok to send in 90 day rx?

## 2023-08-07 ENCOUNTER — Inpatient Hospital Stay: Payer: 59 | Admitting: Oncology

## 2023-08-12 ENCOUNTER — Other Ambulatory Visit: Payer: Self-pay

## 2023-08-12 DIAGNOSIS — N5201 Erectile dysfunction due to arterial insufficiency: Secondary | ICD-10-CM

## 2023-08-14 MED ORDER — TADALAFIL 20 MG PO TABS
20.0000 mg | ORAL_TABLET | Freq: Every day | ORAL | 0 refills | Status: DC | PRN
Start: 2023-08-14 — End: 2023-10-15

## 2023-08-14 MED ORDER — TADALAFIL 20 MG PO TABS: 20.0000 mg | ORAL_TABLET | Freq: Every day | ORAL | 1 refills | Status: DC | PRN

## 2023-08-14 NOTE — Telephone Encounter (Signed)
Called back needing RX called in

## 2023-08-14 NOTE — Telephone Encounter (Signed)
Per Dr. Ronne Binning "Yes that is fine" Patient was made aware and voiced understanding.

## 2023-08-18 ENCOUNTER — Inpatient Hospital Stay: Payer: 59 | Attending: Oncology | Admitting: Oncology

## 2023-08-18 VITALS — BP 136/75 | HR 89 | Temp 98.1°F | Resp 18 | Wt 178.8 lb

## 2023-08-18 DIAGNOSIS — C61 Malignant neoplasm of prostate: Secondary | ICD-10-CM | POA: Diagnosis not present

## 2023-08-18 DIAGNOSIS — R222 Localized swelling, mass and lump, trunk: Secondary | ICD-10-CM | POA: Insufficient documentation

## 2023-08-18 DIAGNOSIS — R9389 Abnormal findings on diagnostic imaging of other specified body structures: Secondary | ICD-10-CM | POA: Diagnosis not present

## 2023-08-18 DIAGNOSIS — E0590 Thyrotoxicosis, unspecified without thyrotoxic crisis or storm: Secondary | ICD-10-CM | POA: Diagnosis not present

## 2023-08-18 DIAGNOSIS — Z8546 Personal history of malignant neoplasm of prostate: Secondary | ICD-10-CM | POA: Insufficient documentation

## 2023-08-18 DIAGNOSIS — J9859 Other diseases of mediastinum, not elsewhere classified: Secondary | ICD-10-CM
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
## 2023-08-18 NOTE — Assessment & Plan Note (Signed)
Initially diagnosed in 2023.  Prostatic adenocarcinoma with a Gleason score of 3+3 equal to 6.5% of 1 core.  Also has a history of BPH.  Reviewed latest PSA of 8.4-->6.6.  Being managed by Dr. Ronne Binning with urology. -PSA stable.  Continue to follow with Dr. Ronne Binning.

## 2023-08-18 NOTE — Progress Notes (Signed)
Patient Care Team: Benita Stabile, MD as PCP - General (Internal Medicine) Jena Gauss Gerrit Friends, MD as Consulting Physician (Gastroenterology) Cindie Crumbly, MD as Medical Oncologist (Medical Oncology) Therese Sarah, RN as Oncology Nurse Navigator (Medical Oncology)  Clinic Day:  08/18/2023  Referring physician: Benita Stabile, MD   CHIEF COMPLAINT:  CC: Abnormal scans    ASSESSMENT & PLAN:   Assessment & Plan: Charles Evans  is a 68 y.o. male with past medical history of prostate cancer initially referred for abnormal scans. Patient had a CT scan concerning for omental thickening and mediastinal mass.  PET scan done showed no uptake in the mediastinal mass.   Primary malignant neoplasm of prostate (HCC) Initially diagnosed in 2023.  Prostatic adenocarcinoma with a Gleason score of 3+3 equal to 6.5% of 1 core.  Also has a history of BPH.  Reviewed latest PSA of 8.4-->6.6.  Being managed by Dr. Ronne Binning with urology. -PSA stable.  Continue to follow with Dr. Ronne Binning.  Abnormal CT scan CT scan revealed abnormal omental thickening and a mediastinal mass.  PET obtained favor lymphangioma rather than malignancy.  PSA improved from previous level.  TSH low with elevated T4.  CA 19-9,AFP, CEA, hCG: Normal.  -Repeat CT scan in 3 months.  If stable can discharge from the clinic.  Hyperthyroidism Recent labs showed elevated T4 and very low TSH.  Patient does report some weight loss and palpitations. -Reached out to Dr. Margo Aye regarding this lab findings with his primary care -Will refer to endocrinology for management of hyperthyroidism    The patient understands the plans discussed today and is in agreement with them.  He knows to contact our office if he develops concerns prior to his next appointment.  I provided 25 minutes of face-to-face time during this encounter and > 50% was spent counseling as documented under my assessment and plan.    Cindie Crumbly, MD  [facility] [place] - A DEPT OF [facility] [address] [place] [address] Dept: [phone] Dept Fax: [phone]   Orders Placed This Encounter  Procedures   CT CHEST ABDOMEN PELVIS W CONTRAST    Standing Status:   Future    Expected Date:   11/16/2023    Expiration Date:   08/17/2024    If indicated for the ordered procedure, I authorize the administration of contrast media per Radiology protocol:   Yes    Does the patient have a contrast media/X-ray dye allergy?:   No    Preferred imaging location?:   [facility]    If indicated for the ordered procedure, I authorize the administration of oral contrast media per Radiology protocol:   Yes     INTERVAL HISTORY:  Charles Evans is here today for follow up. He reports feeling well overall and denies any new symptoms.  He reports a weight loss and palpitations.  He has no other complaints.  In addition to his physical health, the patient has been dealing with grief and depression following the death of his spouse. He recently had an accident which caused significant distress and sleep disturbances, requiring the presence of his son for comfort. He has been prescribed medication to help manage these symptoms.  We discussed that the PET scan was not concerning.  His PSA is stable or decreased from before.  We also discussed the diagnosis of hypothyroidism and will need management with endocrinologist or PCP.  I have reviewed the past medical history, past surgical history, social history and family history with the patient and they are unchanged from previous note.  ALLERGIES:  is allergic to cyclobenzaprine, decadron [dexamethasone], and penicillins.  MEDICATIONS:  Current Outpatient Medications  Medication Sig Dispense Refill   acetaminophen (TYLENOL) 500 MG tablet Take 1-2 tablets (500-1,000 mg total) by mouth every 6 (six) hours as needed for mild pain or moderate pain.  0    albuterol (VENTOLIN HFA) 108 (90 Base) MCG/ACT inhaler 1-2 puffs Q 4-6hrs PRN SOB 1-2 puffs Q 4-6hrs PRN SOB     alfuzosin (UROXATRAL) 10 MG 24 hr tablet Take 1 tablet (10 mg total) by mouth daily with breakfast. 90 tablet 3   amLODipine (NORVASC) 10 MG tablet Take 10 mg by mouth daily.   0   aspirin EC 81 MG tablet Daily Daily     atorvastatin (LIPITOR) 10 MG tablet Take 1 tablet every day by oral route.     docusate sodium (COLACE) 100 MG capsule take one capsule PO daily x 1 week, after that may take daily PRN for constipation take one capsule PO daily x 1 week, after that may take daily PRN for constipation     HYDROcodone-acetaminophen (NORCO) 7.5-325 MG tablet TAKE 1 TABLET BY MOUTH EVERY 6 HOURS (MAX OF 5 DAYS)     lisinopril-hydrochlorothiazide (PRINZIDE,ZESTORETIC) 20-25 MG tablet Take 1 tablet by mouth daily.  0   meloxicam (MOBIC) 7.5 MG tablet Take 1 tablet (7.5 mg total) by mouth daily. 30 tablet 5   metFORMIN (GLUCOPHAGE) 500 MG tablet Take 500 mg by mouth daily.     methocarbamol (ROBAXIN) 500 MG tablet Take 1 tablet (500 mg total) by mouth every 6 (six) hours as needed for muscle spasms. 28 tablet 0   oxymetazoline (AFRIN) 0.05 % nasal spray Place 1 spray into both nostrils 2 (two) times daily as needed for congestion.     tadalafil (CIALIS) 20 MG tablet Take 1 tablet (20 mg total) by mouth daily as needed. 90 tablet 0   tiZANidine (ZANAFLEX) 2 MG tablet      traZODone (DESYREL) 50 MG tablet Take 1 tablet every day by oral route at bedtime.     potassium chloride SA (KLOR-CON M) 20 MEQ tablet Take 1 tablet (20 mEq total) by mouth 2 (two) times daily for 5 days. 10 tablet 0   No current facility-administered medications for this visit.     REVIEW OF SYSTEMS:   Constitutional: Denies fevers, chills or abnormal weight loss Eyes: Denies blurriness of vision Ears, nose, mouth, throat, and face: Denies mucositis or sore throat Respiratory: Denies cough, dyspnea or  wheezes Cardiovascular: Denies palpitation, chest discomfort or lower extremity swelling Gastrointestinal:  Denies nausea, heartburn or change in bowel habits Skin: Denies abnormal skin rashes Lymphatics: Denies new lymphadenopathy or easy bruising Neurological:Denies numbness, tingling or new weaknesses Behavioral/Psych: Mood is stable, no new changes  All other systems were reviewed with the patient and are negative.   VITALS:  Blood pressure 136/75, pulse 89, temperature 98.1 F (36.7 C), temperature source Oral, resp. rate 18, weight 178 lb 12.7 oz (81.1 kg), SpO2 98%.  Wt Readings from Last 3 Encounters:  08/18/23 178 lb 12.7 oz (81.1 kg)  06/30/23 178 lb (80.7 kg)  06/03/23 182 lb 9.6 oz (82.8 kg)    Body mass index is 21.76 kg/m.  Performance status (ECOG): 0 - Asymptomatic  PHYSICAL EXAM:   GENERAL:alert, no distress and comfortable NECK: supple,  thyroid normal size, non-tender, without nodularity LYMPH:  no palpable lymphadenopathy in the cervical, axillary or inguinal LUNGS: clear to auscultation and percussion with normal breathing effort HEART: regular rate & rhythm and no murmurs and no lower extremity edema ABDOMEN:abdomen soft, non-tender and normal bowel sounds Musculoskeletal:no cyanosis of digits and no clubbing  NEURO: alert & oriented x 3 with fluent speech  LABORATORY DATA:  I have reviewed the data as listed    Component Value Date/Time   NA 137 06/03/2023 1210   K 3.7 06/03/2023 1210   CL 104 06/03/2023 1210   CO2 25 06/03/2023 1210   GLUCOSE 101 (H) 06/03/2023 1210   BUN 11 06/03/2023 1210   CREATININE 0.82 06/03/2023 1210   CALCIUM 8.9 06/03/2023 1210   PROT 7.1 06/03/2023 1210   ALBUMIN 3.5 06/03/2023 1210   AST 19 06/03/2023 1210   ALT 22 06/03/2023 1210   ALKPHOS 100 06/03/2023 1210   BILITOT 0.6 06/03/2023 1210   GFRNONAA >60 06/03/2023 1210   GFRAA >60 09/24/2018 1443    Lab Results  Component Value Date   WBC 6.6 06/03/2023    NEUTROABS 3.1 06/03/2023   HGB 14.7 06/03/2023   HCT 44.5 06/03/2023   MCV 84.8 06/03/2023   PLT 228 06/03/2023      Chemistry      Component Value Date/Time   NA 137 06/03/2023 1210   K 3.7 06/03/2023 1210   CL 104 06/03/2023 1210   CO2 25 06/03/2023 1210   BUN 11 06/03/2023 1210   CREATININE 0.82 06/03/2023 1210      Component Value Date/Time   CALCIUM 8.9 06/03/2023 1210   ALKPHOS 100 06/03/2023 1210   AST 19 06/03/2023 1210   ALT 22 06/03/2023 1210   BILITOT 0.6 06/03/2023 1210      Latest Reference Range & Units 10/12/21 12:16 04/05/22 11:52 06/24/22 13:25 10/09/22 14:04 03/25/23 16:06 06/03/23 12:10 06/03/23 12:11  TSH 0.350 - 4.500 uIU/mL       <0.010 (L)  T4,Free(Direct) 0.61 - 1.12 ng/dL      9.14 (H)   Thyroglobulin ng/mL      18   Blood Bank Specimen    SAMPLE AVAILABLE FOR TESTING      Sample Expiration    06/25/2022,2359 Performed at [facility], [address] [place], [place] [address]       AFP, Serum, Tumor Marker 0.0 - 8.4 ng/mL      2.0   CA 19-9 0 - 35 U/mL      <2   CEA 0.0 - 4.7 ng/mL      2.7   hCG Quant 0 - 3 mIU/mL      <1   Prostate Specific Ag, Serum 0.0 - 4.0 ng/mL 5.5 (H) 8.0 (H)  9.2 (H) 6.6 (H)    PSA, Free N/A ng/mL    1.97     PSA, Free Pct %    21.4     (L): Data is abnormally low (H): Data is abnormally high  Latest Reference Range & Units 06/03/23 12:10  LDH 98 - 192 U/L 107     RADIOGRAPHIC STUDIES: I have personally reviewed the radiological images as listed and agreed with the findings in the report.  NM PET Image Initial (PI) Skull Base To Thigh CLINICAL DATA:  Initial treatment strategy for mediastinal mass. History of prostate cancer.  EXAM: NUCLEAR MEDICINE PET SKULL BASE TO THIGH  TECHNIQUE: 8.8 mCi F-18 FDG was injected intravenously. Full-ring  PET imaging was performed from the skull base to thigh after the radiotracer. CT data was obtained and used for attenuation correction and  anatomic localization.  Fasting blood glucose: 104 mg/dl  COMPARISON:  CT chest abdomen pelvis dated 05/14/2023.  FINDINGS: Mediastinal blood pool activity: SUV max 1.6  Liver activity: SUV max NA  NECK: No hypermetabolic lymph nodes in the neck.  Incidental CT findings: None.  CHEST: No suspicious pulmonary nodules.  No hypermetabolic thoracic lymphadenopathy.  Vague low-density soft tissue lesion along the anterior mediastinum/prevascular region (series 202/images [id number]), measuring 4.6 x 2.7 cm on prior CT chest. Associated mild hypermetabolism along the left superior aspect of this lesion, max SUV 3.1.  Incidental CT findings: Mild atherosclerotic calcifications of the aortic arch. Mild coronary atherosclerosis of the LAD.  ABDOMEN/PELVIS: No abnormal hypermetabolic activity within the liver, pancreas, adrenal glands, or spleen.  No hypermetabolic lymph nodes in the abdomen or pelvis. Small bilateral inguinal nodes, likely reactive, without hypermetabolism.  No evidence of omental caking along the anterior abdomen.  Prostatomegaly. No focal hypermetabolism in this patient with known prostate cancer.  Incidental CT findings: Atherosclerotic calcifications of the abdominal aorta and branch vessels.  SKELETON: No focal hypermetabolic activity to suggest skeletal metastasis. No focal osseous lesions.  Incidental CT findings: Mild degenerative changes of the lumbar spine.  IMPRESSION: Vague low-density soft tissue lesion along the anterior mediastinum/prevascular region, better visualized on prior CT chest, with associated mild hypermetabolism. The appearance favors a lymphatic malformation such as lymphangioma, although the location is unusual. Lymphoma is considered unlikely.  Prostatomegaly. No focal hypermetabolism in this patient with known prostate cancer. No findings suspicious for metastatic disease.  Electronically Signed   By: Charline Bills  M.D.   On: 06/21/2023 03:53

## 2023-08-18 NOTE — Assessment & Plan Note (Signed)
CT scan revealed abnormal omental thickening and a mediastinal mass.  PET obtained favor lymphangioma rather than malignancy.  PSA improved from previous level.  TSH low with elevated T4.  CA 19-9,AFP, CEA, hCG: Normal.  -Repeat CT scan in 3 months.  If stable can discharge from the clinic.

## 2023-08-18 NOTE — Assessment & Plan Note (Signed)
Recent labs showed elevated T4 and very low TSH.  Patient does report some weight loss and palpitations. -Reached out to Dr. Margo Aye regarding this lab findings with his primary care -Will refer to endocrinology for management of hyperthyroidism

## 2023-08-21 LAB — TSH: TSH: 0.01 — AB (ref 0.41–5.90)

## 2023-09-02 ENCOUNTER — Other Ambulatory Visit (HOSPITAL_COMMUNITY): Payer: Self-pay | Admitting: Internal Medicine

## 2023-09-02 DIAGNOSIS — E0590 Thyrotoxicosis, unspecified without thyrotoxic crisis or storm: Secondary | ICD-10-CM

## 2023-09-02 DIAGNOSIS — E0540 Thyrotoxicosis factitia without thyrotoxic crisis or storm: Secondary | ICD-10-CM

## 2023-09-11 ENCOUNTER — Encounter (HOSPITAL_COMMUNITY): Payer: 59

## 2023-09-11 ENCOUNTER — Encounter (HOSPITAL_COMMUNITY): Admission: RE | Admit: 2023-09-11 | Payer: 59 | Source: Ambulatory Visit

## 2023-09-12 ENCOUNTER — Other Ambulatory Visit (HOSPITAL_COMMUNITY): Payer: 59

## 2023-09-18 ENCOUNTER — Encounter (HOSPITAL_COMMUNITY)
Admission: RE | Admit: 2023-09-18 | Discharge: 2023-09-18 | Disposition: A | Discharge status: Home / Self Care | Payer: 59 | Source: Ambulatory Visit | Attending: Internal Medicine | Admitting: Internal Medicine

## 2023-09-18 DIAGNOSIS — E0590 Thyrotoxicosis, unspecified without thyrotoxic crisis or storm: Secondary | ICD-10-CM | POA: Diagnosis present

## 2023-09-18 DIAGNOSIS — E0540 Thyrotoxicosis factitia without thyrotoxic crisis or storm: Secondary | ICD-10-CM | POA: Insufficient documentation

## 2023-09-18 MED ORDER — SODIUM IODIDE I-123 7.4 MBQ CAPS
438.0000 | ORAL_CAPSULE | Freq: Once | ORAL | Status: AC
  Administered 2023-09-18: 438 via ORAL

## 2023-09-19 ENCOUNTER — Encounter (HOSPITAL_COMMUNITY)
Admission: RE | Admit: 2023-09-19 | Discharge: 2023-09-19 | Disposition: A | Discharge status: Home / Self Care | Payer: 59 | Source: Ambulatory Visit | Attending: Internal Medicine | Admitting: Internal Medicine

## 2023-10-08 ENCOUNTER — Other Ambulatory Visit: Payer: Medicare Other

## 2023-10-08 DIAGNOSIS — C61 Malignant neoplasm of prostate: Secondary | ICD-10-CM

## 2023-10-09 LAB — PSA, TOTAL AND FREE
PSA, Free Pct: 28.3 %
PSA, Free: 2.04 ng/mL
Prostate Specific Ag, Serum: 7.2 ng/mL — ABNORMAL HIGH (ref 0.0–4.0)

## 2023-10-14 DIAGNOSIS — S72301K Unspecified fracture of shaft of right femur, subsequent encounter for closed fracture with nonunion: Secondary | ICD-10-CM | POA: Diagnosis not present

## 2023-10-14 DIAGNOSIS — M7632 Iliotibial band syndrome, left leg: Secondary | ICD-10-CM | POA: Diagnosis not present

## 2023-10-15 ENCOUNTER — Ambulatory Visit (INDEPENDENT_AMBULATORY_CARE_PROVIDER_SITE_OTHER): Payer: Medicare Other | Admitting: Urology

## 2023-10-15 VITALS — BP 131/80 | HR 74

## 2023-10-15 DIAGNOSIS — R351 Nocturia: Secondary | ICD-10-CM

## 2023-10-15 DIAGNOSIS — N401 Enlarged prostate with lower urinary tract symptoms: Secondary | ICD-10-CM | POA: Diagnosis not present

## 2023-10-15 DIAGNOSIS — C61 Malignant neoplasm of prostate: Secondary | ICD-10-CM

## 2023-10-15 DIAGNOSIS — N400 Enlarged prostate without lower urinary tract symptoms: Secondary | ICD-10-CM

## 2023-10-15 DIAGNOSIS — N5201 Erectile dysfunction due to arterial insufficiency: Secondary | ICD-10-CM

## 2023-10-15 LAB — URINALYSIS, ROUTINE W REFLEX MICROSCOPIC
Bilirubin, UA: NEGATIVE
Glucose, UA: NEGATIVE
Ketones, UA: NEGATIVE
Leukocytes,UA: NEGATIVE
Nitrite, UA: NEGATIVE
Protein,UA: NEGATIVE
RBC, UA: NEGATIVE
Specific Gravity, UA: 1.025 (ref 1.005–1.030)
Urobilinogen, Ur: 1 mg/dL (ref 0.2–1.0)
pH, UA: 6 (ref 5.0–7.5)

## 2023-10-15 MED ORDER — TADALAFIL 20 MG PO TABS: 20.0000 mg | ORAL_TABLET | Freq: Every day | ORAL | 3 refills | Status: DC | PRN

## 2023-10-15 MED ORDER — ALFUZOSIN HCL ER 10 MG PO TB24
10.0000 mg | ORAL_TABLET | Freq: Every day | ORAL | 3 refills | Status: DC
Start: 2023-10-15 — End: 2024-05-27

## 2023-10-15 MED ORDER — LEVOFLOXACIN 750 MG PO TABS: 750.0000 mg | ORAL_TABLET | Freq: Every day | ORAL | 0 refills | Status: DC

## 2023-10-15 MED ORDER — LEVOFLOXACIN 750 MG PO TABS: 750.0000 mg | ORAL_TABLET | Freq: Once | ORAL | 0 refills | Status: DC

## 2023-10-15 NOTE — Patient Instructions (Addendum)
 Prostate Biopsy Instructions:  Appointment Time: 12:45 Appointment Date:04/28/025 Location: [facility]   Please note this procedure is completed at [facility] Department. Please arrive 15 minutes prior to your scheduled visit time to allow registration time. If you are late for your scheduled visit time, you may be asked to reschedule due to time restraints.  You will need to stop all aspirin or blood thinners (aspirin, plavix, coumadin, warfarin, motrin, ibuprofen, advil, aleve, naproxen, naprosyn) for 7 days prior to the procedure.  If you have any questions about stopping these medications, please contact your prescribing doctor.  Our office will obtain clearance for your blood thinners to be held- please do not stop these medications until you have been given instruction to do so.  Having a light meal prior to the procedure is recommended.  If you are diabetic or have low blood sugar please bring a small snack or glucose tablet.  A Fleets enema is needed to be purchased over the counter at a local pharmacy and used 2 hours before you scheduled appointment.  This can be purchased over the counter at any pharmacy.  One antibiotic tablet has been sent to your pharmacy. Please pick this prescription up and take 2 hours prior to your scheduled biopsy appointment. You will also be given an additional antibiotic injection at the biopsy appointment.   Please bring someone with you to the procedure to drive you home if you are given a valium to take prior to your procedure.   If you have any questions or concerns, please feel free to call the office at [phone] or send a Mychart message.  What you can expect after your prostate biopsy:  You may notice blood in your urine or stool up to 1 week after your procedure. You may notice blood in your semen up to 1 month after your procedure.  If you are unable to pee afterwards or develop a fever, please call our  office at [phone] and hit option 3 to speak with a clinical staff member.  Your physicain will notify you of your results.    Thank you, [facility]

## 2023-10-15 NOTE — Progress Notes (Signed)
 10/15/2023 10:08 AM   Charles Evans 10-14-1955 [id number]  Referring provider: Benita Stabile, MD [address] [place],  [place] [address]  Followup prostate cancer   HPI: Mr Charles Evans is a 20JY here for followup for prostate cancer. He cannot have MRI due to metal rod in his femur. PSA increased to 7.2 from 6.6. IPSS 10 QOl 2 on uroxatral 10mg . Nocturia 0x.  Uirne stream strong. He denies any straining to urinate. He has low risk prostate cancer. Last biopsy 18 months ago.    PMH: Past Medical History:  Diagnosis Date   Hypertension     Surgical History: Past Surgical History:  Procedure Laterality Date   BACK SURGERY     COLONOSCOPY N/A 08/21/2015   Procedure: COLONOSCOPY;  Surgeon: West Bali, MD;  Location: [facility];  Service: Endoscopy;  Laterality: N/A;  1430   ESOPHAGOGASTRODUODENOSCOPY N/A 08/21/2015   Procedure: ESOPHAGOGASTRODUODENOSCOPY (EGD);  Surgeon: West Bali, MD;  Location: [facility];  Service: Endoscopy;  Laterality: N/A;   FEMUR IM NAIL Right 06/24/2022   Procedure: INTRAMEDULLARY (IM) RETROGRADE FEMORAL NAILING;  Surgeon: Roby Lofts, MD;  Location: [facility] OR;  Service: Orthopedics;  Laterality: Right;   HERNIA REPAIR     left groin   LUMBAR LAMINECTOMY/DECOMPRESSION MICRODISCECTOMY Left 09/28/2018   Procedure: Microdiscectomy - L3-L4 - left;  Surgeon: Donalee Citrin, MD;  Location: [facility] OR;  Service: Neurosurgery;  Laterality: Left;  Microdiscectomy - L3-L4 - left    Home Medications:  Allergies as of 10/15/2023       Reactions   Cyclobenzaprine Swelling   Decadron [dexamethasone] Swelling, Other (See Comments)   Severe hiccups lasting 15 days   Penicillins Swelling, Rash, Other (See Comments)   Did it involve swelling of the face/tongue/throat, SOB, or low BP? Yes Did it involve sudden or severe rash/hives, skin peeling, or any reaction on the inside of your mouth or nose? No Did you need to seek medical attention at a hospital or doctor's  office? No When did it last happen?      year  If all above answers are "NO", may proceed with cephalosporin use.        Medication List        Accurate as of October 15, 2023 10:08 AM. If you have any questions, ask your nurse or doctor.          acetaminophen 500 MG tablet Commonly known as: TYLENOL Take 1-2 tablets (500-1,000 mg total) by mouth every 6 (six) hours as needed for mild pain or moderate pain.   albuterol 108 (90 Base) MCG/ACT inhaler Commonly known as: VENTOLIN HFA 1-2 puffs Q 4-6hrs PRN SOB 1-2 puffs Q 4-6hrs PRN SOB   alfuzosin 10 MG 24 hr tablet Commonly known as: UROXATRAL Take 1 tablet (10 mg total) by mouth daily with breakfast.   amLODipine 10 MG tablet Commonly known as: NORVASC Take 10 mg by mouth daily.   aspirin EC 81 MG tablet Daily Daily   atorvastatin 10 MG tablet Commonly known as: LIPITOR Take 1 tablet every day by oral route.   Colace 100 MG capsule Generic drug: docusate sodium take one capsule PO daily x 1 week, after that may take daily PRN for constipation take one capsule PO daily x 1 week, after that may take daily PRN for constipation   HYDROcodone-acetaminophen 7.5-325 MG tablet Commonly known as: NORCO TAKE 1 TABLET BY MOUTH EVERY 6 HOURS (MAX OF 5 DAYS)  lisinopril-hydrochlorothiazide 20-25 MG tablet Commonly known as: ZESTORETIC Take 1 tablet by mouth daily.   meloxicam 7.5 MG tablet Commonly known as: Mobic Take 1 tablet (7.5 mg total) by mouth daily.   metFORMIN 500 MG tablet Commonly known as: GLUCOPHAGE Take 500 mg by mouth daily.   methocarbamol 500 MG tablet Commonly known as: ROBAXIN Take 1 tablet (500 mg total) by mouth every 6 (six) hours as needed for muscle spasms.   oxymetazoline 0.05 % nasal spray Commonly known as: AFRIN Place 1 spray into both nostrils 2 (two) times daily as needed for congestion.   potassium chloride SA 20 MEQ tablet Commonly known as: KLOR-CON M Take 1 tablet (20 mEq  total) by mouth 2 (two) times daily for 5 days.   tadalafil 20 MG tablet Commonly known as: CIALIS Take 1 tablet (20 mg total) by mouth daily as needed.   tiZANidine 2 MG tablet Commonly known as: ZANAFLEX   traZODone 50 MG tablet Commonly known as: DESYREL Take 1 tablet every day by oral route at bedtime.        Allergies:  Allergies  Allergen Reactions   Cyclobenzaprine Swelling   Decadron [Dexamethasone] Swelling and Other (See Comments)    Severe hiccups lasting 15 days   Penicillins Swelling, Rash and Other (See Comments)    Did it involve swelling of the face/tongue/throat, SOB, or low BP? Yes Did it involve sudden or severe rash/hives, skin peeling, or any reaction on the inside of your mouth or nose? No Did you need to seek medical attention at a hospital or doctor's office? No When did it last happen?      year  If all above answers are "NO", may proceed with cephalosporin use.      Family History: Family History  Problem Relation Age of Onset   Heart disease Father    Colon cancer Cousin        less than 83    Social History:  reports that he has been smoking cigarettes. He has a 20 pack-year smoking history. He has never used smokeless tobacco. He reports that he does not drink alcohol and does not use drugs.  ROS: All other review of systems were reviewed and are negative except what is noted above in HPI  Physical Exam: BP 131/80   Pulse 74   Constitutional:  Alert and oriented, No acute distress. HEENT: [place] AT, moist mucus membranes.  Trachea midline, no masses. Cardiovascular: No clubbing, cyanosis, or edema. Respiratory: Normal respiratory effort, no increased work of breathing. GI: Abdomen is soft, nontender, nondistended, no abdominal masses GU: No CVA tenderness.  Lymph: No cervical or inguinal lymphadenopathy. Skin: No rashes, bruises or suspicious lesions. Neurologic: Grossly intact, no focal deficits, moving all 4  extremities. Psychiatric: Normal mood and affect.  Laboratory Data: Lab Results  Component Value Date   WBC 6.6 06/03/2023   HGB 14.7 06/03/2023   HCT 44.5 06/03/2023   MCV 84.8 06/03/2023   PLT 228 06/03/2023    Lab Results  Component Value Date   CREATININE 0.82 06/03/2023    No results found for: "PSA"  No results found for: "TESTOSTERONE"  No results found for: "HGBA1C"  Urinalysis    Component Value Date/Time   COLORURINE YELLOW 06/24/2022 1326   APPEARANCEUR Clear 04/16/2023 1521   LABSPEC 1.036 (H) 06/24/2022 1326   PHURINE 6.0 06/24/2022 1326   GLUCOSEU Trace (A) 04/16/2023 1521   HGBUR NEGATIVE 06/24/2022 1326   BILIRUBINUR Negative 04/16/2023 1521  KETONESUR NEGATIVE 06/24/2022 1326   PROTEINUR Negative 04/16/2023 1521   PROTEINUR NEGATIVE 06/24/2022 1326   NITRITE Negative 04/16/2023 1521   NITRITE NEGATIVE 06/24/2022 1326   LEUKOCYTESUR Negative 04/16/2023 1521   LEUKOCYTESUR NEGATIVE 06/24/2022 1326    Lab Results  Component Value Date   LABMICR Comment 04/16/2023   WBCUA 0-5 10/16/2022   LABEPIT 0-10 10/16/2022   MUCUS Present 10/12/2021   BACTERIA None seen 10/16/2022    Pertinent Imaging:  No results found for this or any previous visit.  No results found for this or any previous visit.  No results found for this or any previous visit.  No results found for this or any previous visit.  No results found for this or any previous visit.  No results found for this or any previous visit.  No results found for this or any previous visit.  No results found for this or any previous visit.   Assessment & Plan:    1. Prostate cancer (HCC) (Primary) The patient and I talked about etiologies of elevated PSA.  We discussed the possible relationship between elevated PSA, prostate cancer, BPH, prostatitis, and UTI.   Conservative treatment of elevated PSA with watchful waiting was discussed with the patient.  All questions were answered.         All of the risks and benefits along with alternatives to prostate biopsy were discussed with the patient.  The patient gave fully informed consent to proceed with a transrectal ultrasound guided biopsy of the prostate for the evaluation of their evated PSA.  Prostate biopsy instructions and antibiotics were given to the patient.   2. BPH with nocturia -continue uroxatral 10mg  daily   No follow-ups on file.  Wilkie Aye, MD  [facility] [place]

## 2023-10-19 ENCOUNTER — Encounter: Payer: Self-pay | Admitting: Urology

## 2023-11-04 ENCOUNTER — Ambulatory Visit (HOSPITAL_COMMUNITY)
Admission: RE | Admit: 2023-11-04 | Discharge: 2023-11-04 | Disposition: A | Discharge status: Home / Self Care | Payer: 59 | Source: Ambulatory Visit | Attending: Oncology | Admitting: Oncology

## 2023-11-04 ENCOUNTER — Inpatient Hospital Stay: Payer: 59

## 2023-11-04 DIAGNOSIS — R918 Other nonspecific abnormal finding of lung field: Secondary | ICD-10-CM | POA: Diagnosis not present

## 2023-11-04 DIAGNOSIS — J9859 Other diseases of mediastinum, not elsewhere classified: Secondary | ICD-10-CM | POA: Insufficient documentation

## 2023-11-04 DIAGNOSIS — K5730 Diverticulosis of large intestine without perforation or abscess without bleeding: Secondary | ICD-10-CM | POA: Diagnosis not present

## 2023-11-04 DIAGNOSIS — R590 Localized enlarged lymph nodes: Secondary | ICD-10-CM | POA: Insufficient documentation

## 2023-11-04 DIAGNOSIS — Z8546 Personal history of malignant neoplasm of prostate: Secondary | ICD-10-CM | POA: Diagnosis not present

## 2023-11-04 DIAGNOSIS — J439 Emphysema, unspecified: Secondary | ICD-10-CM | POA: Diagnosis not present

## 2023-11-04 MED ORDER — IOHEXOL 300 MG/ML  SOLN
100.0000 mL | Freq: Once | INTRAMUSCULAR | Status: AC | PRN
  Administered 2023-11-04: 100 mL via INTRAVENOUS

## 2023-11-12 ENCOUNTER — Inpatient Hospital Stay: Payer: 59 | Attending: Oncology | Admitting: Oncology

## 2023-11-13 ENCOUNTER — Inpatient Hospital Stay: Attending: Oncology | Admitting: Oncology

## 2023-11-13 VITALS — BP 132/80 | HR 71 | Temp 97.4°F | Resp 18 | Wt 185.4 lb

## 2023-11-13 DIAGNOSIS — C61 Malignant neoplasm of prostate: Secondary | ICD-10-CM | POA: Diagnosis not present

## 2023-11-13 DIAGNOSIS — N400 Enlarged prostate without lower urinary tract symptoms: Secondary | ICD-10-CM | POA: Insufficient documentation

## 2023-11-13 DIAGNOSIS — Z08 Encounter for follow-up examination after completed treatment for malignant neoplasm: Secondary | ICD-10-CM | POA: Insufficient documentation

## 2023-11-13 DIAGNOSIS — R911 Solitary pulmonary nodule: Secondary | ICD-10-CM

## 2023-11-13 DIAGNOSIS — Z87891 Personal history of nicotine dependence: Secondary | ICD-10-CM | POA: Insufficient documentation

## 2023-11-13 DIAGNOSIS — Z8546 Personal history of malignant neoplasm of prostate: Secondary | ICD-10-CM | POA: Insufficient documentation

## 2023-11-13 DIAGNOSIS — Z720 Tobacco use: Secondary | ICD-10-CM | POA: Diagnosis not present

## 2023-11-13 DIAGNOSIS — E0590 Thyrotoxicosis, unspecified without thyrotoxic crisis or storm: Secondary | ICD-10-CM | POA: Diagnosis not present

## 2023-11-13 DIAGNOSIS — R222 Localized swelling, mass and lump, trunk: Secondary | ICD-10-CM | POA: Diagnosis not present

## 2023-11-13 DIAGNOSIS — R9389 Abnormal findings on diagnostic imaging of other specified body structures: Secondary | ICD-10-CM | POA: Diagnosis not present

## 2023-11-13 NOTE — Assessment & Plan Note (Addendum)
 Recent labs showed elevated T4 and very low TSH.   Patient previously reported some weight loss and palpitations.  Currently has some weight gain. Reached out to Dr. Del Favia regarding this lab findings - his primary care Not on any medication   -Endocrinology appt on 11/24/23

## 2023-11-13 NOTE — Assessment & Plan Note (Signed)
 Patient has a new 12 mm left upper lobe nodule.   Recommended to follow-up with a CT scan in 1 year  - Repeat CT chest with contrast in 1 year  Return to clinic in 1 year to discuss results

## 2023-11-13 NOTE — Progress Notes (Signed)
 Patient Care Team: Omie Bickers, MD as PCP - General (Internal Medicine) Riley Cheadle Windsor Hatcher, MD as Consulting Physician (Gastroenterology) Eduardo Grade, MD as Medical Oncologist (Medical Oncology) Gerhard Knuckles, RN as Oncology Nurse Navigator (Medical Oncology)  Clinic Day:  11/13/2023  Referring physician: Omie Bickers, MD   CHIEF COMPLAINT:  CC: Abnormal scans    ASSESSMENT & PLAN:   Assessment & Plan: Charles Evans  is a 68 y.o. male with past medical history of prostate cancer initially referred for abnormal scans.  Patient had a CT scan concerning for omental thickening and mediastinal mass.  PET scan done showed no uptake in the mediastinal mass.  Recent stable CT scan.  Abnormal CT scan CT scan revealed abnormal omental thickening and a mediastinal mass.   PET obtained favor lymphangioma rather than malignancy.   TSH low with elevated T4.   CA 19-9,AFP, CEA, hCG: Normal.  Repeat CT scan in 3 months shows stable mass  -No further work up needed at this time  Hyperthyroidism Recent labs showed elevated T4 and very low TSH.   Patient previously reported some weight loss and palpitations.  Currently has some weight gain. Reached out to Dr. Del Favia regarding this lab findings - his primary care Not on any medication   -Endocrinology appt on 11/24/23  Primary malignant neoplasm of prostate [facility]) Initially diagnosed in 2023.  Prostatic adenocarcinoma with a Gleason score of 3+3 equal to 6.5% of 1 core.   Also has a history of BPH.  Reviewed latest PSA of 8.4-->6.6-->7.2.   Being managed by Dr. Claretta Croft with urology.  - Patient is planned to get prostate biopsy on Monday. - Recommended patient to reach out to us  if he needs further oncology care. - Will defer care to Dr. Claretta Croft at this point  Tobacco use Long history of smoking, approximately 1 pack per day for 40-50 years.  Stopped a few months ago.  -Encourage continued smoking cessation due to increased risk  of cancer progression and decreased efficacy of potential treatments.   Lung nodule Patient has a new 12 mm left upper lobe nodule.   Recommended to follow-up with a CT scan in 1 year  - Repeat CT chest with contrast in 1 year  Return to clinic in 1 year to discuss results     The patient understands the plans discussed today and is in agreement with them.  He knows to contact our office if he develops concerns prior to his next appointment.  I provided 25 minutes of face-to-face time during this encounter and > 50% was spent counseling as documented under my assessment and plan.    Eduardo Grade, MD  [facility]  - A DEPT OF [facility] [address] [place] [address] Dept: [phone] Dept Fax: [phone]   Orders Placed This Encounter  Procedures   CT Chest W Contrast    Standing Status:   Future    Expected Date:   11/08/2024    Expiration Date:   11/12/2024    If indicated for the ordered procedure, I authorize the administration of contrast media per Radiology protocol:   Yes    Does the patient have a contrast media/X-ray dye allergy?:   No    Preferred imaging location?:   [facility]     INTERVAL HISTORY:  Charles Evans is here today for follow up. He reports feeling well overall and denies any  new symptoms.  Patient denies weight loss, palpitations.  He is in fact gaining weight.  He has not seen endocrinology since our last visit but is scheduled to see them next month.  He has not started on any thyroid  medication.  He is overall feeling well and has no complaints. He saw Dr. Claretta Croft for his prostate cancer and his recent PSA is elevated.  He is planned for biopsy on Monday. We discussed the CT scan results in detail and reviewed the images together.  Mediastinal mass is stable, there is a new left lung nodule that is more than 1 cm which was recommended to follow-up in 1 year.  Will  repeat a CT scan in 1 year.  Overall, patient is feeling well and has no new complaints today.   I have reviewed the past medical history, past surgical history, social history and family history with the patient and they are unchanged from previous note.  ALLERGIES:  is allergic to cyclobenzaprine , decadron  [dexamethasone ], penicillins, and tamsulosin.  MEDICATIONS:  Current Outpatient Medications  Medication Sig Dispense Refill   acetaminophen  (TYLENOL ) 500 MG tablet Take 1-2 tablets (500-1,000 mg total) by mouth every 6 (six) hours as needed for mild pain or moderate pain.  0   albuterol  (VENTOLIN  HFA) 108 (90 Base) MCG/ACT inhaler 1-2 puffs Q 4-6hrs PRN SOB 1-2 puffs Q 4-6hrs PRN SOB     alfuzosin  (UROXATRAL ) 10 MG 24 hr tablet Take 1 tablet (10 mg total) by mouth daily with breakfast. 90 tablet 3   amLODipine  (NORVASC ) 10 MG tablet Take 10 mg by mouth daily.   0   aspirin  EC 81 MG tablet Daily Daily     atorvastatin (LIPITOR) 10 MG tablet Take 1 tablet every day by oral route.     docusate sodium  (COLACE) 100 MG capsule take one capsule PO daily x 1 week, after that may take daily PRN for constipation take one capsule PO daily x 1 week, after that may take daily PRN for constipation     HYDROcodone -acetaminophen  (NORCO) 7.5-325 MG tablet TAKE 1 TABLET BY MOUTH EVERY 6 HOURS (MAX OF 5 DAYS)     levofloxacin  (LEVAQUIN ) 750 MG tablet Take 1 tablet (750 mg total) by mouth daily. Take 1 hour prior to your prostate biopsy procedure. 1 tablet 0   lisinopril -hydrochlorothiazide  (PRINZIDE ,ZESTORETIC ) 20-25 MG tablet Take 1 tablet by mouth daily.  0   meloxicam  (MOBIC ) 7.5 MG tablet Take 1 tablet (7.5 mg total) by mouth daily. 30 tablet 5   metFORMIN  (GLUCOPHAGE ) 500 MG tablet Take 500 mg by mouth daily.     methocarbamol  (ROBAXIN ) 500 MG tablet Take 1 tablet (500 mg total) by mouth every 6 (six) hours as needed for muscle spasms. 28 tablet 0   oxymetazoline (AFRIN) 0.05 % nasal spray Place 1  spray into both nostrils 2 (two) times daily as needed for congestion.     tadalafil  (CIALIS ) 20 MG tablet Take 1 tablet (20 mg total) by mouth daily as needed. 90 tablet 3   tiZANidine (ZANAFLEX) 2 MG tablet      traZODone (DESYREL) 50 MG tablet Take 1 tablet every day by oral route at bedtime.     potassium chloride  SA (KLOR-CON  M) 20 MEQ tablet Take 1 tablet (20 mEq total) by mouth 2 (two) times daily for 5 days. 10 tablet 0   No current facility-administered medications for this visit.    REVIEW OF SYSTEMS:   Constitutional: Denies fevers, chills or abnormal weight loss  Eyes: Denies blurriness of vision Ears, nose, mouth, throat, and face: Denies mucositis or sore throat Respiratory: Denies cough, dyspnea or wheezes Cardiovascular: Denies palpitation, chest discomfort or lower extremity swelling Gastrointestinal:  Denies nausea, heartburn or change in bowel habits Skin: Denies abnormal skin rashes Lymphatics: Denies new lymphadenopathy or easy bruising Neurological:Denies numbness, tingling or new weaknesses Behavioral/Psych: Mood is stable, no new changes  All other systems were reviewed with the patient and are negative.   VITALS:  Blood pressure 132/80, pulse 71, temperature (!) 97.4 F (36.3 C), temperature source Oral, resp. rate 18, weight 185 lb 6.4 oz (84.1 kg), SpO2 97%.  Wt Readings from Last 3 Encounters:  11/13/23 185 lb 6.4 oz (84.1 kg)  08/18/23 178 lb 12.7 oz (81.1 kg)  06/30/23 178 lb (80.7 kg)    Body mass index is 22.57 kg/m.  Performance status (ECOG): 0 - Asymptomatic  PHYSICAL EXAM:   GENERAL:alert, no distress and comfortable NECK: supple, thyroid  normal size, non-tender, without nodularity LYMPH:  no palpable lymphadenopathy in the cervical, axillary or inguinal LUNGS: clear to auscultation and percussion with normal breathing effort HEART: regular rate & rhythm and no murmurs and no lower extremity edema ABDOMEN:abdomen soft, non-tender and  normal bowel sounds Musculoskeletal:no cyanosis of digits and no clubbing  NEURO: alert & oriented x 3 with fluent speech  LABORATORY DATA:  I have reviewed the data as listed     Component Value Date/Time   NA 137 06/03/2023 1210   K 3.7 06/03/2023 1210   CL 104 06/03/2023 1210   CO2 25 06/03/2023 1210   GLUCOSE 101 (H) 06/03/2023 1210   BUN 11 06/03/2023 1210   CREATININE 0.82 06/03/2023 1210   CALCIUM 8.9 06/03/2023 1210   PROT 7.1 06/03/2023 1210   ALBUMIN 3.5 06/03/2023 1210   AST 19 06/03/2023 1210   ALT 22 06/03/2023 1210   ALKPHOS 100 06/03/2023 1210   BILITOT 0.6 06/03/2023 1210   GFRNONAA >60 06/03/2023 1210   GFRAA >60 09/24/2018 1443    Lab Results  Component Value Date   WBC 6.6 06/03/2023   NEUTROABS 3.1 06/03/2023   HGB 14.7 06/03/2023   HCT 44.5 06/03/2023   MCV 84.8 06/03/2023   PLT 228 06/03/2023      Chemistry      Component Value Date/Time   NA 137 06/03/2023 1210   K 3.7 06/03/2023 1210   CL 104 06/03/2023 1210   CO2 25 06/03/2023 1210   BUN 11 06/03/2023 1210   CREATININE 0.82 06/03/2023 1210      Component Value Date/Time   CALCIUM 8.9 06/03/2023 1210   ALKPHOS 100 06/03/2023 1210   AST 19 06/03/2023 1210   ALT 22 06/03/2023 1210   BILITOT 0.6 06/03/2023 1210      Latest Reference Range & Units 10/12/21 12:16 04/05/22 11:52 06/24/22 13:25 10/09/22 14:04 03/25/23 16:06 06/03/23 12:10 06/03/23 12:11  TSH 0.350 - 4.500 uIU/mL       <0.010 (L)  T4,Free(Direct) 0.61 - 1.12 ng/dL      1.19 (H)   Thyroglobulin ng/mL      18   AFP, Serum, Tumor Marker 0.0 - 8.4 ng/mL      2.0   CA 19-9 0 - 35 U/mL      <2   CEA 0.0 - 4.7 ng/mL      2.7   hCG Quant 0 - 3 mIU/mL      <1   PSA, Free N/A ng/mL    1.97  PSA, Free Pct %    21.4     (L): Data is abnormally low (H): Data is abnormally high  Latest Reference Range & Units 06/03/23 12:10  LDH 98 - 192 U/L 107    Latest Reference Range & Units 10/08/23 08:35  Prostate Specific Ag, Serum  0.0 - 4.0 ng/mL 7.2 (H)  PSA, Free N/A ng/mL 2.04  PSA, Free Pct % 28.3  (H): Data is abnormally high  RADIOGRAPHIC STUDIES: I have personally reviewed the radiological images as listed and agreed with the findings in the report.  CT CHEST ABDOMEN PELVIS W CONTRAST CLINICAL DATA:  Follow-up mediastinal mass. History of prostate cancer. * Tracking Code: BO *  EXAM: CT CHEST, ABDOMEN, AND PELVIS WITH CONTRAST  TECHNIQUE: Multidetector CT imaging of the chest, abdomen and pelvis was performed following the standard protocol during bolus administration of intravenous contrast.  RADIATION DOSE REDUCTION: This exam was performed according to the departmental dose-optimization program which includes automated exposure control, adjustment of the mA and/or kV according to patient size and/or use of iterative reconstruction technique.  CONTRAST:  OMNIPAQUE  IOHEXOL  300 MG/ML  SOLN  COMPARISON:  CT 05/14/2023 and older.  PET-CT 06/05/2023.  FINDINGS: CT CHEST FINDINGS  Cardiovascular: Heart is nonenlarged. Coronary artery calcifications are seen. No pericardial effusion. The thoracic aorta is normal course and caliber with mild atherosclerotic calcified plaque.  Mediastinum/Nodes: Preserved thyroid  gland. Slightly patulous thoracic esophagus. No specific abnormal lymph node enlargement identified in the axillary regions or hilum. There is an enlarged node along left side of the mediastinum on image 28 of series 2 measuring 16 by 18 mm today. Previously 16 x 18 mm as well. There are several other prominent, borderline nodes in the mediastinum including paratracheal which are similar as well. These nodes do not show hypermetabolism on the recent PET-CT.  The ill-defined area low-density along the upper mediastinum surrounding the brachiocephalic artery is essentially stable from previous examination. On the prior CT this was measured in total at 4.6 x 2.7 cm. When measured in  same fashion is today 4.2 by 2.7 cm on image 19. This also had minimal uptake on the prior PET-CT.  Lungs/Pleura: Emphysematous lung changes identified. There is some linear opacity at the bases likely scar or atelectasis. No consolidation, pneumothorax or effusion. Stable right apical 5 mm lung nodule on series 3, image 37. This is stable going back to at least June 2023 demonstrating almost 2 years of stability. Few other tiny nodules are also identified in the lungs. There is 1 new 2 mm focus in the lateral aspect of the left upper lobe on series 3, image 62.  Musculoskeletal: Focal lipoma along the right paraspinal musculature series 2, image 31, unchanged. Slight curvature of the spine with some degenerative changes.  CT ABDOMEN PELVIS FINDINGS  Hepatobiliary: No focal liver abnormality is seen. No gallstones, gallbladder wall thickening, or biliary dilatation.  Pancreas: Unremarkable. No pancreatic ductal dilatation or surrounding inflammatory changes.  Spleen: Normal in size without focal abnormality.  Adrenals/Urinary Tract: Adrenal glands are unremarkable. Kidneys are normal, without renal calculi, focal lesion, or hydronephrosis. Bladder is unremarkable.  Stomach/Bowel: On this non oral contrast exam the stomach and small bowel are nondilated. Few fluid-filled loops of nondilated small bowel seen in the mid abdomen. Large bowel has a normal course and caliber. Scattered right-sided colonic stool. Sigmoid colon diverticula. Distal small bowel stool appearance identified, nonspecific as well. Normal appendix seen in the right lower quadrant best seen on  coronal imaging. There is some scattered mild areas of high density along the lumen of the small bowel, likely luminal contents.  Vascular/Lymphatic: Aortic atherosclerosis. No enlarged abdominal or pelvic lymph nodes.  Reproductive: Enlarged prostate with mass effect along the base of the bladder. Please correlate  with the patient's PSA.  Other: No free intra-abdominal air. No fluid collections are identified.  Musculoskeletal: Curvature of the spine with some degenerative changes. Degenerative changes along the hips. Intramedullary rod along the right femur at the edge of the imaging field.  IMPRESSION: Stable cystic like area along the anterior superior mediastinum.  Stable prominent to mildly enlarged mediastinal lymph nodes. These were not hypermetabolic on prior PET-CT.  Few tiny lung nodules identified. Several have been present for almost 2 years. There is 1 2 mm left upper lobe nodule which is new from previous. Recommend follow-up in 1 year.  No bowel obstruction, free air or free fluid. Few colonic diverticula.  Enlarged prostate.  Please correlate with the patient's PSA.  Emphysematous lung changes.  Electronically Signed   By: Adrianna Horde M.D.   On: 11/07/2023 17:19

## 2023-11-13 NOTE — Patient Instructions (Signed)
 VISIT SUMMARY:  Today, we reviewed the status of your stable mass and new lung nodules, discussed your thyroid  levels, and addressed your chronic back pain.  YOUR PLAN:  -LUNG NODULE: A lung nodule is a small growth in the lung. Your recent CT scan shows that the size of the nodule has not changed. It is important to stop smoking to prevent further lung issues. We will repeat the CT scan in one year to monitor the nodule.  -THYROID  DYSFUNCTION: Thyroid  dysfunction means that your thyroid  gland is not working properly. Your thyroid  levels were previously high, but you have no current symptoms and your weight has stabilized. Please attend your scheduled endocrinology appointment next month to further evaluate and manage this condition.  INSTRUCTIONS:  Please follow up with a repeat CT scan in one year to monitor your lung nodule. Attend your endocrinology appointment next month to evaluate your thyroid  levels. Consider using a foam mattress topper to help with your back pain.

## 2023-11-13 NOTE — Assessment & Plan Note (Addendum)
 Initially diagnosed in 2023.  Prostatic adenocarcinoma with a Gleason score of 3+3 equal to 6.5% of 1 core.   Also has a history of BPH.  Reviewed latest PSA of 8.4-->6.6-->7.2.   Being managed by Dr. Claretta Croft with urology.  - Patient is planned to get prostate biopsy on Monday. - Recommended patient to reach out to us  if he needs further oncology care. - Will defer care to Dr. Claretta Croft at this point

## 2023-11-13 NOTE — Assessment & Plan Note (Signed)
 CT scan revealed abnormal omental thickening and a mediastinal mass.   PET obtained favor lymphangioma rather than malignancy.   TSH low with elevated T4.   CA 19-9,AFP, CEA, hCG: Normal.  Repeat CT scan in 3 months shows stable mass  -No further work up needed at this time

## 2023-11-13 NOTE — Assessment & Plan Note (Signed)
 Long history of smoking, approximately 1 pack per day for 40-50 years.  Stopped a few months ago.  -Encourage continued smoking cessation due to increased risk of cancer progression and decreased efficacy of potential treatments.

## 2023-11-17 ENCOUNTER — Ambulatory Visit (HOSPITAL_BASED_OUTPATIENT_CLINIC_OR_DEPARTMENT_OTHER): Admitting: Urology

## 2023-11-17 ENCOUNTER — Ambulatory Visit (HOSPITAL_COMMUNITY)
Admission: RE | Admit: 2023-11-17 | Discharge: 2023-11-17 | Disposition: A | Discharge status: Home / Self Care | Source: Ambulatory Visit | Attending: Urology | Admitting: Urology

## 2023-11-17 ENCOUNTER — Encounter (HOSPITAL_COMMUNITY): Payer: Self-pay

## 2023-11-17 ENCOUNTER — Other Ambulatory Visit: Payer: Self-pay | Admitting: Urology

## 2023-11-17 VITALS — BP 112/71 | HR 72 | Temp 98.0°F | Resp 18

## 2023-11-17 DIAGNOSIS — Z8546 Personal history of malignant neoplasm of prostate: Secondary | ICD-10-CM | POA: Insufficient documentation

## 2023-11-17 DIAGNOSIS — C61 Malignant neoplasm of prostate: Secondary | ICD-10-CM | POA: Diagnosis not present

## 2023-11-17 DIAGNOSIS — N4232 Atypical small acinar proliferation of prostate: Secondary | ICD-10-CM | POA: Insufficient documentation

## 2023-11-17 DIAGNOSIS — R972 Elevated prostate specific antigen [PSA]: Secondary | ICD-10-CM | POA: Insufficient documentation

## 2023-11-17 MED ORDER — GENTAMICIN SULFATE 40 MG/ML IJ SOLN
INTRAMUSCULAR | Status: AC
  Filled 2023-11-17: qty 2

## 2023-11-17 MED ORDER — LIDOCAINE HCL (PF) 2 % IJ SOLN
INTRAMUSCULAR | Status: AC
  Filled 2023-11-17: qty 10

## 2023-11-17 MED ORDER — GENTAMICIN SULFATE 40 MG/ML IJ SOLN
80.0000 mg | Freq: Once | INTRAMUSCULAR | Status: AC
  Administered 2023-11-17: 80 mg via INTRAMUSCULAR

## 2023-11-17 MED ORDER — LIDOCAINE HCL (PF) 2 % IJ SOLN
10.0000 mL | Freq: Once | INTRAMUSCULAR | Status: AC
Start: 2023-11-17 — End: 2023-11-17
  Administered 2023-11-17: 10 mL

## 2023-11-17 NOTE — Progress Notes (Signed)
 PT tolerated prostate biopsy procedure and antibiotic injection well today. Labs obtained and sent for pathology. PT ambulatory at discharge with no acute distress noted and verbalized understanding of discharge instructions. PT to follow up with urologist as scheduled on 11/28/23 @1250 .

## 2023-11-19 ENCOUNTER — Encounter: Payer: Self-pay | Admitting: Urology

## 2023-11-19 LAB — SURGICAL PATHOLOGY

## 2023-11-19 NOTE — Progress Notes (Signed)
 Prostate Biopsy Procedure   Informed consent was obtained after discussing risks/benefits of the procedure.  A time out was performed to ensure correct patient identity.  Pre-Procedure: - Last PSA Level: No results found for: "PSA" - Gentamicin  given prophylactically - Levaquin  500 mg administered PO -Transrectal Ultrasound performed revealing a 90.5 gm prostate -No significant hypoechoic or median lobe noted  Procedure: - Prostate block performed using 10 cc 1% lidocaine  and biopsies taken from sextant areas, a total of 12 under ultrasound guidance.  Post-Procedure: - Patient tolerated the procedure well - He was counseled to seek immediate medical attention if experiences any severe pain, significant bleeding, or fevers - Return in one week to discuss biopsy results

## 2023-11-19 NOTE — Patient Instructions (Signed)
 Transrectal Ultrasound-Guided Prostate Biopsy, Care After What can I expect after the procedure? After the procedure, it is common to have: Pain and discomfort near your butt (rectum), especially while sitting. Pink-colored pee (urine). This is due to small amounts of blood in your pee. A burning feeling while peeing. Blood in your poop (stool). Bleeding from your butt. Blood in your semen. Follow these instructions at home: Medicines Take over-the-counter and prescription medicines only as told by your doctor. If you were given a sedative during your procedure, do not drive or use machines until your doctor says that it is safe. A sedative is a medicine that helps you relax. If you were prescribed an antibiotic medicine, take it as told by your doctor. Do not stop taking it even if you start to feel better. Activity  Return to your normal activities when your doctor says that it is safe. Ask your doctor when it is okay for you to have sex. You may have to avoid lifting. Ask your doctor how much you can safely lift. General instructions  Drink enough water to keep your pee pale yellow. Watch your pee, poop, and semen for new bleeding or bleeding that gets worse. Keep all follow-up visits. Contact a doctor if: You have any of these: Blood clots in your pee or poop. Blood in your pee more than 2 weeks after the procedure. Blood in your semen more than 2 months after the procedure. New or worse bleeding in your pee, poop, or semen. Very bad belly pain. Your pee smells bad or unusual. You have trouble peeing. Your lower belly feels firm. You have problems getting an erection. You feel like you may vomit (are nauseous), or you vomit. Get help right away if: You have a fever or chills. You have bright red pee. You have very bad pain that does not get better with medicine. You cannot pee. Summary After this procedure, it is common to have pain and discomfort near your butt,  especially while sitting. You may have blood in your pee and poop. It is common to have blood in your semen. Get help right away if you have a fever or chills. This information is not intended to replace advice given to you by your health care provider. Make sure you discuss any questions you have with your health care provider. Document Revised: 01/01/2021 Document Reviewed: 01/01/2021 Elsevier Patient Education  2024 [employer].

## 2023-11-21 NOTE — Patient Instructions (Signed)
 Hyperthyroidism Hyperthyroidism refers to a thyroid gland that is too active or overactive. The thyroid gland is a small gland located in the lower front part of the neck, just in front of the windpipe (trachea). This gland makes hormones that: Help control how the body uses food for energy (metabolism). Help the heart and brain work well. Keep your bones strong. When the thyroid is overactive, it produces too much of a hormone called thyroxine. What are the causes? This condition may be caused by: Graves' disease. This is a disorder in which the body's disease-fighting system (immune system) attacks the thyroid gland. This is the most common cause. Inflammation of the thyroid gland. A tumor in the thyroid gland. Use of certain medicines, including: Prescription thyroid hormone replacement. Herbal supplements that mimic thyroid hormones. Amiodarone therapy. Solid or fluid-filled lumps within your thyroid gland (thyroid nodules). Taking in a large amount of iodine from foods or medicines. What increases the risk? You are more likely to develop this condition if: You are male. You have a family history of thyroid conditions. You smoke tobacco. You use a medicine called lithium. You take medicines that affect the immune system (immunosuppressants). What are the signs or symptoms? Symptoms of this condition include: Nervousness. Inability to tolerate heat. Diarrhea. Rapid heart rate. Shaky hands. Restlessness. Sleep problems. Other symptoms may include: Heart skipping beats or making extra beats. Unexplained weight loss. Change in the texture of hair or skin. Loss of menstruation. Fatigue. Enlarged thyroid gland or a lump in the thyroid (nodule). You may also have symptoms of Graves' disease, which may include: Protruding eyes. Dry eyes. Red or swollen eyes. Problems with vision. How is this diagnosed? This condition may be diagnosed based on: Your symptoms and medical  history. A physical exam. Blood tests. Thyroid ultrasound. This test involves using sound waves to produce images of the thyroid gland. A thyroid scan. A radioactive substance is injected into a vein, and images show how much iodine is present in the thyroid. Radioactive iodine uptake test (RAIU). A small amount of radioactive iodine is given by mouth to see how much iodine the thyroid absorbs after a certain amount of time. How is this treated? Treatment depends on the cause and severity of the condition. Treatment may include: Medicines to reduce the amount of thyroid hormone your body makes. Medicines to help manage your symptoms. Radioactive iodine treatment (radioiodine therapy). This involves swallowing a small dose of radioactive iodine, in capsule or liquid form, to kill thyroid cells. Surgery to remove part or all of your thyroid gland. You may need to take thyroid hormone replacement medicine for the rest of your life after thyroid surgery. Follow these instructions at home:  Take over-the-counter and prescription medicines only as told by your health care provider. Do not use any products that contain nicotine or tobacco. These products include cigarettes, chewing tobacco, and vaping devices, such as e-cigarettes. If you need help quitting, ask your health care provider. Follow any instructions from your health care provider about diet. You may be instructed to limit foods that contain iodine. Keep all follow-up visits. You will need to have blood tests regularly so that your health care provider can monitor your condition. Where to find more information [employer] of Diabetes and Digestive and Kidney Diseases: [email or web address] Contact a health care provider if: Your symptoms do not get better with treatment. You have a fever. You have abdominal pain. You feel dizzy. You are taking thyroid hormone replacement medicine and: You have  symptoms of depression. You feel like you  are tired all the time. You gain weight. Get help right away if: You have sudden, unexplained confusion or other mental changes. You have chest pain. You have fast or irregular heartbeats (palpitations). You have difficulty breathing. These symptoms may be an emergency. Get help right away. Call 911. Do not wait to see if the symptoms will go away. Do not drive yourself to the hospital. Summary The thyroid gland is a small gland located in the lower front part of the neck, just in front of the windpipe. Hyperthyroidism is when the thyroid gland is too active and produces too much of a hormone called thyroxine. The most common cause is Graves' disease, a disorder in which your immune system attacks the thyroid gland. Hyperthyroidism can cause various symptoms, such as unexplained weight loss, nervousness, inability to tolerate heat, or changes in your heartbeat. Treatment may include medicine to reduce the amount of thyroid hormone your body makes, radioiodine therapy, surgery, or medicines to manage symptoms. This information is not intended to replace advice given to you by your health care provider. Make sure you discuss any questions you have with your health care provider. Document Revised: 08/31/2021 Document Reviewed: 08/31/2021 Elsevier Patient Education  2024 [employer].

## 2023-11-24 ENCOUNTER — Ambulatory Visit (INDEPENDENT_AMBULATORY_CARE_PROVIDER_SITE_OTHER): Payer: Self-pay | Admitting: Nurse Practitioner

## 2023-11-24 ENCOUNTER — Encounter: Payer: Self-pay | Admitting: Nurse Practitioner

## 2023-11-24 VITALS — BP 130/78 | HR 62 | Ht 76.0 in | Wt 183.8 lb

## 2023-11-24 DIAGNOSIS — E0590 Thyrotoxicosis, unspecified without thyrotoxic crisis or storm: Secondary | ICD-10-CM

## 2023-11-24 NOTE — Progress Notes (Signed)
 11/24/2023     Endocrinology Consult Note    Subjective:    Patient ID: Charles Evans, male    DOB: [birth year], PCP Charles Bickers, MD.   Past Medical History:  Diagnosis Date   Hypertension     Past Surgical History:  Procedure Laterality Date   BACK SURGERY     COLONOSCOPY N/A 08/21/2015   Procedure: COLONOSCOPY;  Surgeon: Alyce Jubilee, MD;  Location: [facility];  Service: Endoscopy;  Laterality: N/A;  1430   ESOPHAGOGASTRODUODENOSCOPY N/A 08/21/2015   Procedure: ESOPHAGOGASTRODUODENOSCOPY (EGD);  Surgeon: Alyce Jubilee, MD;  Location: [facility];  Service: Endoscopy;  Laterality: N/A;   FEMUR IM NAIL Right 06/24/2022   Procedure: INTRAMEDULLARY (IM) RETROGRADE FEMORAL NAILING;  Surgeon: Laneta Pintos, MD;  Location: [facility] OR;  Service: Orthopedics;  Laterality: Right;   HERNIA REPAIR     left groin   LUMBAR LAMINECTOMY/DECOMPRESSION MICRODISCECTOMY Left 09/28/2018   Procedure: Microdiscectomy - L3-L4 - left;  Surgeon: Gearl Keens, MD;  [facility] OR;  Service: Neurosurgery;  Laterality: Left;  Microdiscectomy - L3-L4 - left    Social History   Socioeconomic History   Marital status: Widowed    Spouse name: Not on file   Number of children: 3   Years of education: Not on file   Highest education level: Not on file  Occupational History   Occupation: Fixer  Tobacco Use   Smoking status: Every Day    Current packs/day: 1.00    Average packs/day: 1 pack/day for 20.0 years (20.0 ttl pk-yrs)    Types: Cigarettes   Smokeless tobacco: Never  Vaping Use   Vaping status: Never Used  Substance and Sexual Activity   Alcohol use: No   Drug use: No   Sexual activity: Yes    Birth control/protection: None  Other Topics Concern   Not on file  Social History Narrative   Not on file   Social Drivers of Health   Financial Resource Strain: Not on file  Food Insecurity: No Food Insecurity (06/27/2022)   Hunger Vital Sign    Worried About Running Out of Food in  the Last Year: Never true    Ran Out of Food in the Last Year: Never true  Transportation Needs: No Transportation Needs (06/27/2022)   PRAPARE - Administrator, Civil Service (Medical): No    Lack of Transportation (Non-Medical): No  Physical Activity: Not on file  Stress: Not on file  Social Connections: Not on file    Family History  Problem Relation Age of Onset   Heart disease Father    Colon cancer Cousin        less than 60    Outpatient Encounter Medications as of 11/24/2023  Medication Sig   acetaminophen  (TYLENOL ) 500 MG tablet Take 1-2 tablets (500-1,000 mg total) by mouth every 6 (six) hours as needed for mild pain or moderate pain.   albuterol  (VENTOLIN  HFA) 108 (90 Base) MCG/ACT inhaler 1-2 puffs Q 4-6hrs PRN SOB 1-2 puffs Q 4-6hrs PRN SOB   alfuzosin  (UROXATRAL ) 10 MG 24 hr tablet Take 1 tablet (10 mg total) by mouth daily with breakfast.   amLODipine  (NORVASC ) 10 MG tablet Take 10 mg by mouth daily.    aspirin  EC 81 MG tablet Daily Daily   atorvastatin (LIPITOR) 10 MG tablet Take 1 tablet every day by oral route.   HYDROcodone -acetaminophen  (NORCO) 7.5-325 MG tablet TAKE 1 TABLET BY MOUTH EVERY  6 HOURS (MAX OF 5 DAYS)   lisinopril -hydrochlorothiazide  (PRINZIDE ,ZESTORETIC ) 20-25 MG tablet Take 1 tablet by mouth daily.   methocarbamol  (ROBAXIN ) 500 MG tablet Take 1 tablet (500 mg total) by mouth every 6 (six) hours as needed for muscle spasms.   oxymetazoline (AFRIN) 0.05 % nasal spray Place 1 spray into both nostrils 2 (two) times daily as needed for congestion.   tadalafil  (CIALIS ) 20 MG tablet Take 1 tablet (20 mg total) by mouth daily as needed.   docusate sodium  (COLACE) 100 MG capsule take one capsule PO daily x 1 week, after that may take daily PRN for constipation take one capsule PO daily x 1 week, after that may take daily PRN for constipation (Patient not taking: Reported on 11/24/2023)   levofloxacin  (LEVAQUIN ) 750 MG tablet Take 1 tablet (750 mg total)  by mouth daily. Take 1 hour prior to your prostate biopsy procedure. (Patient not taking: Reported on 11/24/2023)   meloxicam  (MOBIC ) 7.5 MG tablet Take 1 tablet (7.5 mg total) by mouth daily. (Patient not taking: Reported on 11/24/2023)   metFORMIN  (GLUCOPHAGE ) 500 MG tablet Take 500 mg by mouth daily. (Patient not taking: Reported on 11/24/2023)   potassium chloride  SA (KLOR-CON  M) 20 MEQ tablet Take 1 tablet (20 mEq total) by mouth 2 (two) times daily for 5 days.   tiZANidine (ZANAFLEX) 2 MG tablet  (Patient not taking: Reported on 11/24/2023)   traZODone (DESYREL) 50 MG tablet Take 1 tablet every day by oral route at bedtime. (Patient not taking: Reported on 11/24/2023)   No facility-administered encounter medications on file as of 11/24/2023.    ALLERGIES: Allergies  Allergen Reactions   Cyclobenzaprine  Swelling   Decadron  [Dexamethasone ] Swelling and Other (See Comments)    Severe hiccups lasting 15 days   Penicillins Swelling, Rash and Other (See Comments)    Did it involve swelling of the face/tongue/throat, SOB, or low BP? Yes Did it involve sudden or severe rash/hives, skin peeling, or any reaction on the inside of your mouth or nose? No Did you need to seek medical attention at a hospital or doctor's office? No When did it last happen?      year  If all above answers are "NO", may proceed with cephalosporin use.     Tamsulosin     Other Reaction(s): Not available  tamsulosin    VACCINATION STATUS: Immunization History  Administered Date(s) Administered   Tdap 01/05/2017, 06/24/2022     HPI  Charles Evans is 68 y.o. male who presents today with a medical history as above. he is being seen in consultation for hyperthyroidism requested by Charles Bickers, MD.  he has been dealing with symptoms of weight loss, palpitations, tremors, intermittent diarrhea, hot sweats for about 6 months or more. These symptoms are not getting any better and troubling to him.  his most recent thyroid  labs  revealed suppressed TSH of < 0.005 and elevated Free T4 of 2.08, and elevated FT3 of 5.5 on 08/21/23.  he denies dysphagia, choking, shortness of breath, no recent voice change.    he does have family history of thyroid  dysfunction in his mom (hypothyroidism-also a patient of mine), but denies family hx of thyroid  cancer. he denies personal history of goiter. he is not on any anti-thyroid  medications nor on any thyroid  hormone supplements. He does take a mens MVI which contains Biotin.  he is willing to proceed with appropriate work up and therapy for thyrotoxicosis.   Review of systems  Constitutional: +  decreasing body weight, current Body mass index is 22.37 kg/m., no fatigue, + subjective hyperthermia, no subjective hypothermia Eyes: no blurry vision, no xerophthalmia ENT: no sore throat, no nodules palpated in throat, no dysphagia/odynophagia, no hoarseness Cardiovascular: no chest pain, no shortness of breath, + palpitations- hx of afib, no leg swelling Respiratory: no cough, no shortness of breath Gastrointestinal: no nausea/vomiting, + intermittent diarrhea Musculoskeletal: no muscle/joint aches Skin: no rashes, no hyperemia Neurological: + tremors, no numbness, no tingling, no dizziness Psychiatric: no depression, no anxiety   Objective:    BP 130/78 (BP Location: Left Arm, Patient Position: Sitting, Cuff Size: Large)   Pulse 62   Ht 6\' 4"  (1.93 m)   Wt 183 lb 12.8 oz (83.4 kg)   BMI 22.37 kg/m   Wt Readings from Last 3 Encounters:  11/24/23 183 lb 12.8 oz (83.4 kg)  11/13/23 185 lb 6.4 oz (84.1 kg)  08/18/23 178 lb 12.7 oz (81.1 kg)     BP Readings from Last 3 Encounters:  11/24/23 130/78  11/17/23 112/71  11/13/23 132/80                          Physical Exam- Limited  Constitutional:  Body mass index is 22.37 kg/m. , not in acute distress, normal state of mind Eyes:  EOMI, no exophthalmos Neck: Supple Thyroid : No gross goiter, no palpable  nodularity Cardiovascular: RRR, no murmurs, rubs, or gallops, no edema Respiratory: Adequate breathing efforts, no crackles, rales, rhonchi, or wheezing Musculoskeletal: no gross deformities, strength intact in all four extremities, no gross restriction of joint movements Skin:  no rashes, no hyperemia Neurological: + tremor with outstretched hands, DTR normal in BLE   CMP     Component Value Date/Time   NA 137 06/03/2023 1210   K 3.7 06/03/2023 1210   CL 104 06/03/2023 1210   CO2 25 06/03/2023 1210   GLUCOSE 101 (H) 06/03/2023 1210   BUN 11 06/03/2023 1210   CREATININE 0.82 06/03/2023 1210   CALCIUM 8.9 06/03/2023 1210   PROT 7.1 06/03/2023 1210   ALBUMIN 3.5 06/03/2023 1210   AST 19 06/03/2023 1210   ALT 22 06/03/2023 1210   ALKPHOS 100 06/03/2023 1210   BILITOT 0.6 06/03/2023 1210   GFRNONAA >60 06/03/2023 1210     CBC    Component Value Date/Time   WBC 6.6 06/03/2023 1210   RBC 5.25 06/03/2023 1210   HGB 14.7 06/03/2023 1210   HCT 44.5 06/03/2023 1210   PLT 228 06/03/2023 1210   MCV 84.8 06/03/2023 1210   MCH 28.0 06/03/2023 1210   MCHC 33.0 06/03/2023 1210   RDW 14.9 06/03/2023 1210   LYMPHSABS 2.9 06/03/2023 1210   MONOABS 0.5 06/03/2023 1210   EOSABS 0.1 06/03/2023 1210   BASOSABS 0.0 06/03/2023 1210     Diabetic Labs (most recent): No results found for: "HGBA1C", "MICROALBUR"  Lipid Panel  No results found for: "CHOL", "TRIG", "HDL", "CHOLHDL", "VLDL", "LDLCALC", "LDLDIRECT", "LABVLDL"   Lab Results  Component Value Date   TSH 0.01 (A) 08/21/2023   TSH <0.010 (L) 06/03/2023   FREET4 1.81 (H) 06/03/2023     Latest Reference Range & Units 06/03/23 12:10 06/03/23 12:11 08/21/23 00:00  TSH 0.41 - 5.90   <0.010 (L) 0.01 ! (E)  T4,Free(Direct) 0.61 - 1.12 ng/dL 1.61 (H)    Thyroglobulin ng/mL 18    (L): Data is abnormally low !: Data is abnormal (H): Data is abnormally high (E):  External lab result  NM Uptake and Scan from 09/19/23 CLINICAL  DATA:  Clinical hyperthyroidism, unintentional weight loss, TSH less than 0.010   EXAM: THYROID  SCAN AND UPTAKE - 4 AND 24 HOURS   TECHNIQUE: Following oral administration of I-123 capsule, anterior planar imaging was acquired at 24 hours. Thyroid  uptake was calculated with a thyroid  probe at 4-6 hours and 24 hours.   RADIOPHARMACEUTICALS:  438.0 uCi I-123 sodium iodide p.o.   COMPARISON:  None Available.   FINDINGS: There is uniform distribution of radiotracer within the thyroid  gland. No focal hot or cold nodules are identified.   4 hour I-123 uptake = 6.7% (normal 5-20%)   24 hour I-123 uptake = 17.8% (normal 10-30%)   IMPRESSION: Normal thyroid  iodine  uptake and organification     Electronically Signed   By: Worthy Heads M.D.   On: 09/29/2023 03:58   Assessment & Plan:   1. Hyperthyroidism (Primary)  he is being seen at a kind request of Charles Bickers, MD.  his history and most recent labs are reviewed, and he was examined clinically. Subjective and objective findings are consistent with thyrotoxicosis likely from primary hyperthyroidism. The potential risks of untreated thyrotoxicosis and the need for definitive therapy have been discussed in detail with him, and he agrees to proceed with diagnostic workup and treatment plan.   I will repeat full profile thyroid  function tests once he has been off his MVI which contains biotin for at least 5 days (including antibody testing to assess for autoimmune thyroid  dysfunction-only TPO antibodies were checked previously thus will check thyroglobulin antibodies).  Will call/message patient with results and next steps.  His uptake and scan from 09/19/23 shows normal 4 hr and 24 hr uptakes, consistent with acute inflammation of the thyroid ?   Options of therapy are discussed with him.  We discussed the option of treating it with medications including methimazole or PTU which may have side effects including rash, transaminitis,  and bone marrow suppression.        -Patient is advised to maintain close follow up with Charles Bickers, MD for primary care needs.   - Time spent with the patient: 60 minutes, which was spent in obtaining information about his symptoms, reviewing his previous labs, evaluations, and treatments, counseling him about his hyperthyroidism , and developing a plan to confirm the diagnosis and long term treatment as necessary. Please refer to "Patient Self Inventory" in the Media tab for reviewed elements of pertinent patient history.  Charles Evans participated in the discussions, expressed understanding, and voiced agreement with the above plans.  All questions were answered to his satisfaction. he is encouraged to contact clinic should he have any questions or concerns prior to his return visit.   Follow up plan: Return will call with thyroid  labs and next steps.   Thank you for involving me in the care of this pleasant patient, and I will continue to update you with his progress.    Hulon Magic, [facility] [address] [place], [place] [address] Phone: [phone] Fax: [phone]  11/24/2023, 10:51 AM

## 2023-11-25 ENCOUNTER — Telehealth: Payer: Self-pay

## 2023-11-25 NOTE — Telephone Encounter (Signed)
 Patient called and made aware.  Biopsy talk canceled and appointment made for 6 month follow up with psa.

## 2023-11-25 NOTE — Telephone Encounter (Signed)
-----   Message from Johnie Nailer sent at 11/25/2023  7:43 AM EDT ----- Negative. Have him see me in 6 months with a PSA ----- Message ----- From: Interface, Lab In Three Zero One Sent: 11/19/2023  10:51 AM EDT To: Marco Severs, MD

## 2023-11-28 ENCOUNTER — Ambulatory Visit: Admitting: Urology

## 2023-12-01 DIAGNOSIS — E0590 Thyrotoxicosis, unspecified without thyrotoxic crisis or storm: Secondary | ICD-10-CM | POA: Diagnosis not present

## 2023-12-03 ENCOUNTER — Other Ambulatory Visit: Payer: Self-pay | Admitting: Nurse Practitioner

## 2023-12-03 ENCOUNTER — Ambulatory Visit: Payer: Self-pay | Admitting: Nurse Practitioner

## 2023-12-03 DIAGNOSIS — E0590 Thyrotoxicosis, unspecified without thyrotoxic crisis or storm: Secondary | ICD-10-CM

## 2023-12-03 LAB — T3, FREE: T3, Free: 3.7 pg/mL (ref 2.0–4.4)

## 2023-12-03 LAB — TSH: TSH: <0.005 u[IU]/mL — ABNORMAL LOW (ref 0.450–4.500)

## 2023-12-03 LAB — THYROGLOBULIN ANTIBODY

## 2023-12-03 LAB — T4, FREE: Free T4: 1.45 ng/dL (ref 0.82–1.77)

## 2023-12-03 NOTE — Progress Notes (Signed)
 FYI I sent mychart message going over recent thyroid  labs and next steps.  Need to schedule him for follow up in 3 months with previsit labs.

## 2023-12-04 NOTE — Progress Notes (Signed)
 Mailed to pt

## 2023-12-10 DIAGNOSIS — I10 Essential (primary) hypertension: Secondary | ICD-10-CM | POA: Diagnosis not present

## 2023-12-10 DIAGNOSIS — R7303 Prediabetes: Secondary | ICD-10-CM | POA: Diagnosis not present

## 2023-12-16 DIAGNOSIS — E0590 Thyrotoxicosis, unspecified without thyrotoxic crisis or storm: Secondary | ICD-10-CM | POA: Diagnosis not present

## 2023-12-16 DIAGNOSIS — I10 Essential (primary) hypertension: Secondary | ICD-10-CM | POA: Diagnosis not present

## 2023-12-16 DIAGNOSIS — R918 Other nonspecific abnormal finding of lung field: Secondary | ICD-10-CM | POA: Diagnosis not present

## 2023-12-16 DIAGNOSIS — G4700 Insomnia, unspecified: Secondary | ICD-10-CM | POA: Diagnosis not present

## 2023-12-16 DIAGNOSIS — R7303 Prediabetes: Secondary | ICD-10-CM | POA: Diagnosis not present

## 2023-12-16 DIAGNOSIS — R9389 Abnormal findings on diagnostic imaging of other specified body structures: Secondary | ICD-10-CM | POA: Diagnosis not present

## 2023-12-16 DIAGNOSIS — I700 Atherosclerosis of aorta: Secondary | ICD-10-CM | POA: Diagnosis not present

## 2023-12-16 DIAGNOSIS — M545 Low back pain, unspecified: Secondary | ICD-10-CM | POA: Diagnosis not present

## 2023-12-16 DIAGNOSIS — F17210 Nicotine dependence, cigarettes, uncomplicated: Secondary | ICD-10-CM | POA: Diagnosis not present

## 2023-12-16 DIAGNOSIS — R634 Abnormal weight loss: Secondary | ICD-10-CM | POA: Diagnosis not present

## 2023-12-24 ENCOUNTER — Telehealth: Payer: Self-pay | Admitting: Pulmonary Disease

## 2023-12-24 DIAGNOSIS — J9859 Other diseases of mediastinum, not elsewhere classified: Secondary | ICD-10-CM

## 2023-12-24 NOTE — Telephone Encounter (Signed)
 Pt needs a new CT Chest W/O ordered by another provider, as Dr. Thelda Finney is no longer with our office. Thank you

## 2023-12-24 NOTE — Telephone Encounter (Signed)
 Dr. Baldwin Levee please advise if OK to place new CT order.

## 2023-12-24 NOTE — Telephone Encounter (Signed)
New CT order has been placed.  

## 2023-12-24 NOTE — Telephone Encounter (Signed)
 Yes ok to order

## 2023-12-26 NOTE — Telephone Encounter (Signed)
 Patient has been scheduled

## 2024-01-01 ENCOUNTER — Ambulatory Visit (HOSPITAL_COMMUNITY)
Admission: RE | Admit: 2024-01-01 | Discharge: 2024-01-01 | Disposition: A | Discharge status: Home / Self Care | Source: Ambulatory Visit | Attending: Emergency Medicine | Admitting: Emergency Medicine

## 2024-01-01 DIAGNOSIS — R590 Localized enlarged lymph nodes: Secondary | ICD-10-CM | POA: Diagnosis not present

## 2024-01-01 DIAGNOSIS — J9859 Other diseases of mediastinum, not elsewhere classified: Secondary | ICD-10-CM | POA: Insufficient documentation

## 2024-01-01 DIAGNOSIS — R918 Other nonspecific abnormal finding of lung field: Secondary | ICD-10-CM | POA: Diagnosis not present

## 2024-02-04 DIAGNOSIS — R9431 Abnormal electrocardiogram [ECG] [EKG]: Secondary | ICD-10-CM | POA: Diagnosis not present

## 2024-02-04 DIAGNOSIS — I10 Essential (primary) hypertension: Secondary | ICD-10-CM | POA: Diagnosis not present

## 2024-02-04 DIAGNOSIS — E0590 Thyrotoxicosis, unspecified without thyrotoxic crisis or storm: Secondary | ICD-10-CM | POA: Diagnosis not present

## 2024-02-04 DIAGNOSIS — R42 Dizziness and giddiness: Secondary | ICD-10-CM | POA: Diagnosis not present

## 2024-02-11 DIAGNOSIS — Z79899 Other long term (current) drug therapy: Secondary | ICD-10-CM | POA: Diagnosis not present

## 2024-02-11 DIAGNOSIS — I10 Essential (primary) hypertension: Secondary | ICD-10-CM | POA: Diagnosis not present

## 2024-02-11 DIAGNOSIS — R42 Dizziness and giddiness: Secondary | ICD-10-CM | POA: Diagnosis not present

## 2024-02-26 ENCOUNTER — Ambulatory Visit: Admitting: Emergency Medicine

## 2024-02-26 ENCOUNTER — Encounter: Payer: Self-pay | Admitting: Emergency Medicine

## 2024-03-05 ENCOUNTER — Ambulatory Visit: Admitting: Nurse Practitioner

## 2024-03-05 DIAGNOSIS — E0590 Thyrotoxicosis, unspecified without thyrotoxic crisis or storm: Secondary | ICD-10-CM

## 2024-03-08 DIAGNOSIS — E0590 Thyrotoxicosis, unspecified without thyrotoxic crisis or storm: Secondary | ICD-10-CM | POA: Diagnosis not present

## 2024-03-08 DIAGNOSIS — R42 Dizziness and giddiness: Secondary | ICD-10-CM | POA: Diagnosis not present

## 2024-03-08 LAB — BASIC METABOLIC PANEL WITH GFR
BUN: 12 (ref 4–21)
CO2: 22 (ref 13–22)
Chloride: 104 (ref 99–108)
Creatinine: 1 (ref 0.6–1.3)
Glucose: 94
Potassium: 3.5 meq/L (ref 3.5–5.1)
Sodium: 141 (ref 137–147)

## 2024-03-08 LAB — COMPREHENSIVE METABOLIC PANEL WITH GFR
Albumin: 3.9 (ref 3.5–5.0)
Calcium: 9.1 (ref 8.7–10.7)
EGFR: 83
Globulin: 3

## 2024-03-08 LAB — HEPATIC FUNCTION PANEL
ALT: 19 U/L (ref 10–40)
AST: 22 (ref 14–40)
Alkaline Phosphatase: 109 (ref 25–125)
Bilirubin, Total: 0.6

## 2024-03-09 LAB — TSH
Free T4: 1.34 ng/dL
TSH: 0.03 — AB (ref 0.41–5.90)

## 2024-03-10 ENCOUNTER — Encounter: Payer: Self-pay | Admitting: Nurse Practitioner

## 2024-03-10 ENCOUNTER — Ambulatory Visit (INDEPENDENT_AMBULATORY_CARE_PROVIDER_SITE_OTHER): Admitting: Nurse Practitioner

## 2024-03-10 VITALS — BP 128/74 | HR 77 | Ht 76.0 in | Wt 183.0 lb

## 2024-03-10 DIAGNOSIS — E0590 Thyrotoxicosis, unspecified without thyrotoxic crisis or storm: Secondary | ICD-10-CM | POA: Diagnosis not present

## 2024-03-10 DIAGNOSIS — R09A2 Foreign body sensation, throat: Secondary | ICD-10-CM

## 2024-03-10 NOTE — Progress Notes (Signed)
 03/10/2024     Endocrinology Consult Note    Subjective:    Patient ID: Charles Evans, male    DOB: 1956-06-01, PCP Shona Norleen PEDLAR, MD.   Past Medical History:  Diagnosis Date   Hypertension     Past Surgical History:  Procedure Laterality Date   BACK SURGERY     COLONOSCOPY N/A 08/21/2015   Procedure: COLONOSCOPY;  Surgeon: Margo LITTIE Haddock, MD;  Location: [facility];  Service: Endoscopy;  Laterality: N/A;  1430   ESOPHAGOGASTRODUODENOSCOPY N/A 08/21/2015   Procedure: ESOPHAGOGASTRODUODENOSCOPY (EGD);  Surgeon: Margo LITTIE Haddock, MD;  Location: [facility];  Service: Endoscopy;  Laterality: N/A;   FEMUR IM NAIL Right 06/24/2022   Procedure: INTRAMEDULLARY (IM) RETROGRADE FEMORAL NAILING;  Surgeon: Kendal Franky SQUIBB, MD;  Location: [facility] OR;  Service: Orthopedics;  Laterality: Right;   HERNIA REPAIR     left groin   LUMBAR LAMINECTOMY/DECOMPRESSION MICRODISCECTOMY Left 09/28/2018   Procedure: Microdiscectomy - L3-L4 - left;  Surgeon: Onetha Kuba, MD;  Location: [facility] OR;  Service: Neurosurgery;  Laterality: Left;  Microdiscectomy - L3-L4 - left    Social History   Socioeconomic History   Marital status: Widowed    Spouse name: Not on file   Number of children: 3   Years of education: Not on file   Highest education level: Not on file  Occupational History   Occupation: Fixer  Tobacco Use   Smoking status: Every Day    Current packs/day: 1.00    Average packs/day: 1 pack/day for 20.0 years (20.0 ttl pk-yrs)    Types: Cigarettes   Smokeless tobacco: Never  Vaping Use   Vaping status: Never Used  Substance and Sexual Activity   Alcohol use: No   Drug use: No   Sexual activity: Yes    Birth control/protection: None  Other Topics Concern   Not on file  Social History Narrative   Not on file   Social Drivers of Health   Financial Resource Strain: Not on file  Food Insecurity: No Food Insecurity (06/27/2022)   Hunger Vital Sign    Worried About Running Out of Food  in the Last Year: Never true    Ran Out of Food in the Last Year: Never true  Transportation Needs: No Transportation Needs (06/27/2022)   PRAPARE - Administrator, Civil Service (Medical): No    Lack of Transportation (Non-Medical): No  Physical Activity: Not on file  Stress: Not on file  Social Connections: Not on file    Family History  Problem Relation Age of Onset   Heart disease Father    Colon cancer Cousin        less than 60    Outpatient Encounter Medications as of 03/10/2024  Medication Sig   acetaminophen  (TYLENOL ) 500 MG tablet Take 1-2 tablets (500-1,000 mg total) by mouth every 6 (six) hours as needed for mild pain or moderate pain.   albuterol  (VENTOLIN  HFA) 108 (90 Base) MCG/ACT inhaler 1-2 puffs Q 4-6hrs PRN SOB 1-2 puffs Q 4-6hrs PRN SOB   alfuzosin  (UROXATRAL ) 10 MG 24 hr tablet Take 1 tablet (10 mg total) by mouth daily with breakfast.   amLODipine  (NORVASC ) 10 MG tablet Take 10 mg by mouth daily.    aspirin  EC 81 MG tablet Daily Daily   atorvastatin (LIPITOR) 10 MG tablet Take 1 tablet every day by oral route.   HYDROcodone -acetaminophen  (NORCO) 7.5-325 MG tablet TAKE 1 TABLET BY MOUTH EVERY  6 HOURS (MAX OF 5 DAYS)   lisinopril -hydrochlorothiazide  (PRINZIDE ,ZESTORETIC ) 20-25 MG tablet Take 1 tablet by mouth daily.   methocarbamol  (ROBAXIN ) 500 MG tablet Take 1 tablet (500 mg total) by mouth every 6 (six) hours as needed for muscle spasms.   oxymetazoline (AFRIN) 0.05 % nasal spray Place 1 spray into both nostrils 2 (two) times daily as needed for congestion.   potassium chloride  SA (KLOR-CON  M) 20 MEQ tablet Take 1 tablet (20 mEq total) by mouth 2 (two) times daily for 5 days.   tadalafil  (CIALIS ) 20 MG tablet Take 1 tablet (20 mg total) by mouth daily as needed.   docusate sodium  (COLACE) 100 MG capsule take one capsule PO daily x 1 week, after that may take daily PRN for constipation take one capsule PO daily x 1 week, after that may take daily PRN for  constipation (Patient not taking: Reported on 03/10/2024)   levofloxacin  (LEVAQUIN ) 750 MG tablet Take 1 tablet (750 mg total) by mouth daily. Take 1 hour prior to your prostate biopsy procedure. (Patient not taking: Reported on 03/10/2024)   meloxicam  (MOBIC ) 7.5 MG tablet Take 1 tablet (7.5 mg total) by mouth daily. (Patient not taking: Reported on 03/10/2024)   metFORMIN  (GLUCOPHAGE ) 500 MG tablet Take 500 mg by mouth daily. (Patient not taking: Reported on 03/10/2024)   tiZANidine (ZANAFLEX) 2 MG tablet  (Patient not taking: Reported on 03/10/2024)   traZODone (DESYREL) 50 MG tablet Take 1 tablet every day by oral route at bedtime. (Patient not taking: Reported on 03/10/2024)   No facility-administered encounter medications on file as of 03/10/2024.    ALLERGIES: Allergies  Allergen Reactions   Cyclobenzaprine  Swelling   Decadron  [Dexamethasone ] Swelling and Other (See Comments)    Severe hiccups lasting 15 days   Penicillins Swelling, Rash and Other (See Comments)    Did it involve swelling of the face/tongue/throat, SOB, or low BP? Yes Did it involve sudden or severe rash/hives, skin peeling, or any reaction on the inside of your mouth or nose? No Did you need to seek medical attention at a hospital or doctor's office? No When did it last happen?      year  If all above answers are NO, may proceed with cephalosporin use.     Tamsulosin     Other Reaction(s): Not available  tamsulosin    VACCINATION STATUS: Immunization History  Administered Date(s) Administered   Tdap 01/05/2017, 06/24/2022     HPI  Charles Evans is 68 y.o. male who presents today with a medical history as above. he is being seen in consultation for hyperthyroidism requested by Shona Norleen PEDLAR, MD.  he has been dealing with symptoms of weight loss, palpitations, tremors, intermittent diarrhea, hot sweats for about 6 months or more. These symptoms are not getting any better and troubling to him.  his most recent  thyroid  labs revealed suppressed TSH of < 0.005 and elevated Free T4 of 2.08, and elevated FT3 of 5.5 on 08/21/23.   he does have family history of thyroid  dysfunction in his mom (hypothyroidism-also a patient of mine), but denies family hx of thyroid  cancer. he denies personal history of goiter. he is not on any anti-thyroid  medications nor on any thyroid  hormone supplements. He does take a mens MVI which contains Biotin.  he is willing to proceed with appropriate work up and therapy for thyrotoxicosis.  -Since last visit he does note some choking spells, trouble swallowing and globus sensation.   Review of systems  Constitutional: + Minimally fluctuating body weight,  current Body mass index is 22.28 kg/m. , no fatigue, no subjective hyperthermia, no subjective hypothermia Eyes: no blurry vision, no xerophthalmia ENT: no sore throat, no nodules palpated in throat, + intermittent dysphagia/odynophagia, no hoarseness Cardiovascular: no chest pain, no shortness of breath, no palpitations, no leg swelling Respiratory: no cough, no shortness of breath Gastrointestinal: no nausea/vomiting/diarrhea Musculoskeletal: no muscle/joint aches Skin: no rashes, no hyperemia Neurological: no tremors, no numbness, no tingling, no dizziness Psychiatric: no depression, no anxiety   Objective:    BP 128/74 (BP Location: Left Arm, Patient Position: Sitting, Cuff Size: Large)   Pulse 77   Ht 6' 4 (1.93 m)   Wt 183 lb (83 kg)   BMI 22.28 kg/m   Wt Readings from Last 3 Encounters:  03/10/24 183 lb (83 kg)  11/24/23 183 lb 12.8 oz (83.4 kg)  11/13/23 185 lb 6.4 oz (84.1 kg)     BP Readings from Last 3 Encounters:  03/10/24 128/74  11/24/23 130/78  11/17/23 112/71     Physical Exam- Limited  Constitutional:  Body mass index is 22.28 kg/m. , not in acute distress, normal state of mind Eyes:  EOMI, no exophthalmos Thyroid : asymmetrical gland L>R ?nodule Musculoskeletal: no gross deformities,  strength intact in all four extremities, no gross restriction of joint movements Skin:  no rashes, no hyperemia Neurological: no tremor with outstretched hands   CMP     Component Value Date/Time   NA 141 03/08/2024 0000   K 3.5 03/08/2024 0000   CL 104 03/08/2024 0000   CO2 22 03/08/2024 0000   GLUCOSE 101 (H) 06/03/2023 1210   BUN 12 03/08/2024 0000   CREATININE 1.0 03/08/2024 0000   CREATININE 0.82 06/03/2023 1210   CALCIUM 9.1 03/08/2024 0000   PROT 7.1 06/03/2023 1210   ALBUMIN 3.9 03/08/2024 0000   AST 22 03/08/2024 0000   ALT 19 03/08/2024 0000   ALKPHOS 109 03/08/2024 0000   BILITOT 0.6 06/03/2023 1210   EGFR 83.0 03/08/2024 0000   GFRNONAA >60 06/03/2023 1210     CBC    Component Value Date/Time   WBC 6.6 06/03/2023 1210   RBC 5.25 06/03/2023 1210   HGB 14.7 06/03/2023 1210   HCT 44.5 06/03/2023 1210   PLT 228 06/03/2023 1210   MCV 84.8 06/03/2023 1210   MCH 28.0 06/03/2023 1210   MCHC 33.0 06/03/2023 1210   RDW 14.9 06/03/2023 1210   LYMPHSABS 2.9 06/03/2023 1210   MONOABS 0.5 06/03/2023 1210   EOSABS 0.1 06/03/2023 1210   BASOSABS 0.0 06/03/2023 1210     Diabetic Labs (most recent): No results found for: HGBA1C, MICROALBUR  Lipid Panel  No results found for: CHOL, TRIG, HDL, CHOLHDL, VLDL, LDLCALC, LDLDIRECT, LABVLDL   Lab Results  Component Value Date   TSH 0.03 (A) 03/09/2024   TSH <0.005 (L) 12/01/2023   TSH 0.01 (A) 08/21/2023   TSH <0.010 (L) 06/03/2023   FREET4 1.34 03/09/2024   FREET4 1.45 12/01/2023   FREET4 1.81 (H) 06/03/2023     NM Uptake and Scan from 09/19/23 CLINICAL DATA:  Clinical hyperthyroidism, unintentional weight loss, TSH less than 0.010   EXAM: THYROID  SCAN AND UPTAKE - 4 AND 24 HOURS   TECHNIQUE: Following oral administration of I-123 capsule, anterior planar imaging was acquired at 24 hours. Thyroid  uptake was calculated with a thyroid  probe at 4-6 hours and 24 hours.    RADIOPHARMACEUTICALS:  438.0 uCi I-123 sodium iodide p.o.  COMPARISON:  None Available.   FINDINGS: There is uniform distribution of radiotracer within the thyroid  gland. No focal hot or cold nodules are identified.   4 hour I-123 uptake = 6.7% (normal 5-20%)   24 hour I-123 uptake = 17.8% (normal 10-30%)   IMPRESSION: Normal thyroid  iodine  uptake and organification     Electronically Signed   By: Dorethia Molt M.D.   On: 09/29/2023 03:58    Latest Reference Range & Units 12/01/23 07:56 03/09/24 12:54  TSH 0.41 - 5.90  <0.005 (L) 0.03 ! (C) (E)  Triiodothyronine,Free,Serum 2.0 - 4.4 pg/mL 3.7   T4,Free(Direct) ng/dL 8.54 8.65 (E)  Thyroglobulin Antibody 0.0 - 0.9 IU/mL <1.0   (L): Data is abnormally low !: Data is abnormal (C): Corrected (E): External lab result   Assessment & Plan:   1. Hyperthyroidism-subclinical  he is being seen at a kind request of Shona Norleen PEDLAR, MD.  his history and most recent labs are reviewed, and he was examined clinically. Subjective and objective findings are consistent with thyrotoxicosis likely from primary hyperthyroidism. The potential risks of untreated thyrotoxicosis and the need for definitive therapy have been discussed in detail with him, and he agrees to proceed with diagnostic workup and treatment plan.   -Antibody testing was negative, ruling out autoimmune thyroid  dysfunction.  His uptake and scan from 09/19/23 shows normal 4 hr and 24 hr uptakes, consistent with acute inflammation of the thyroid ?   His repeat thyroid  labs show improved TSH and normal Free T3 and Free T4 levels, consistent with subclinical hyperthyroidism.  He does not require any antithyroid treatment at this time.  Will repeat labs in 4 months.  I will order thyroid  ultrasound to assess baseline anatomy due to recent development with globus sensation, intermittent choking, dysphagia.  Will call him with the results once they return.      -Patient is  advised to maintain close follow up with Shona Norleen PEDLAR, MD for primary care needs.    I spent  17  minutes in the care of the patient today including review of labs from Thyroid  Function, CMP, and other relevant labs ; imaging/biopsy records (current and previous including abstractions from other facilities); face-to-face time discussing  his lab results and symptoms, medications doses, his options of short and long term treatment based on the latest standards of care / guidelines;   and documenting the encounter.  Christopher LITTIE Daring  participated in the discussions, expressed understanding, and voiced agreement with the above plans.  All questions were answered to his satisfaction. he is encouraged to contact clinic should he have any questions or concerns prior to his return visit.  Follow up plan: Return in about 4 months (around 07/10/2024) for Thyroid  follow up, Previsit labs, thyroid  ultrasound.   Thank you for involving me in the care of this pleasant patient, and I will continue to update you with his progress.    Benton Rio, [facility] [address] [place], [place] [address] Phone: [phone] Fax: [phone]  03/10/2024, 2:55 PM

## 2024-03-18 DIAGNOSIS — G8929 Other chronic pain: Secondary | ICD-10-CM | POA: Diagnosis not present

## 2024-03-18 DIAGNOSIS — I10 Essential (primary) hypertension: Secondary | ICD-10-CM | POA: Diagnosis not present

## 2024-03-18 DIAGNOSIS — M545 Low back pain, unspecified: Secondary | ICD-10-CM | POA: Diagnosis not present

## 2024-03-19 ENCOUNTER — Ambulatory Visit (HOSPITAL_COMMUNITY): Attending: Nurse Practitioner

## 2024-03-31 ENCOUNTER — Other Ambulatory Visit: Payer: Self-pay

## 2024-03-31 DIAGNOSIS — N5201 Erectile dysfunction due to arterial insufficiency: Secondary | ICD-10-CM

## 2024-03-31 NOTE — Telephone Encounter (Signed)
 Pt called for refilled and was made aware message will be sent to MD, [place]

## 2024-04-06 MED ORDER — TADALAFIL 20 MG PO TABS: 20.0000 mg | ORAL_TABLET | Freq: Every day | ORAL | 3 refills | Status: AC | PRN

## 2024-04-06 NOTE — Telephone Encounter (Signed)
 Pt was made aware Rx tadalafil  was sent to pharmacy on filed. Verbalized understanding

## 2024-05-05 ENCOUNTER — Ambulatory Visit (INDEPENDENT_AMBULATORY_CARE_PROVIDER_SITE_OTHER): Admitting: Emergency Medicine

## 2024-05-05 ENCOUNTER — Encounter: Payer: Self-pay | Admitting: Emergency Medicine

## 2024-05-05 VITALS — BP 100/58 | HR 85 | Temp 99.1°F | Ht 76.0 in | Wt 187.6 lb

## 2024-05-05 DIAGNOSIS — R911 Solitary pulmonary nodule: Secondary | ICD-10-CM | POA: Diagnosis not present

## 2024-05-05 DIAGNOSIS — J9859 Other diseases of mediastinum, not elsewhere classified: Secondary | ICD-10-CM

## 2024-05-05 MED ORDER — ALBUTEROL SULFATE HFA 108 (90 BASE) MCG/ACT IN AERS: 2.0000 | INHALATION_SPRAY | RESPIRATORY_TRACT | 2 refills | Status: AC | PRN

## 2024-05-05 NOTE — Progress Notes (Signed)
 Subjective:    Patient ID: Charles Evans, male    DOB: May 11, 1956, 68 y.o.   MRN: [id number]  HPI Discussed the use of AI scribe software for clinical note transcription with the patient, who gave verbal consent to proceed.  History of Present Illness Charles Evans is a 68 year old male with a history of hypertension who presents for follow-up of a mediastinal mass. He was referred by Dr. Brenna for evaluation of a mediastinal mass.  He has been monitored for a small anterior mediastinal mass located behind the sternum, initially PET negative and suspected to be a lymphatic malformation. He remains asymptomatic, and serial imaging has been used for follow-up. The most recent CT scan on 01-16-2024, showed less pronounced soft tissue fat stranding in the superior anterior mediastinum compared to the prior study from April 2025. An AP window lymph node measuring 1.3 cm remained unchanged. There is bilateral pleuroparenchymal scarring at the apices, a stable 6 mm nodule in the right upper lobe, and some bilateral atelectasis noted on imaging.  He has a long history of tobacco use, having smoked since he was 68 years old. He currently smokes about half a pack per day, having reduced from a higher amount. He experiences shortness of breath, particularly at night, which prevents him from smoking cigarettes. No scheduled bronchodilator therapy is in use, but he has albuterol  available, which he rarely uses. He has never used an inhaler before.  He works as a Curator and reports that his breathing does not significantly limit his ability to perform his job. He experiences some coughing at night but denies any significant impact on his daily activities.    Results  CT chest from April and June reviewed and interpreted by me  RADIOLOGY Chest CT: Normal heart size, soft tissue fat stranding in superior anterior mediastinum less pronounced, AP window lymph node 1.3 cm unchanged, bilateral  pleuroparenchymal scarring at apices, paraseptal and centrilobular emphysema, stable 6 mm nodule in right upper lobe, bilateral atelectasis (01/16/24)    Review of Systems As per HPI  Past Medical History:  Diagnosis Date   Hypertension     Family History  Problem Relation Age of Onset   Heart disease Father    Colon cancer Cousin        less than 60     Social History   Socioeconomic History   Marital status: Widowed    Spouse name: Not on file   Number of children: 3   Years of education: Not on file   Highest education level: Not on file  Occupational History   Occupation: Fixer  Tobacco Use   Smoking status: Every Day    Current packs/day: 1.00    Average packs/day: 1 pack/day for 20.0 years (20.0 ttl pk-yrs)    Types: Cigarettes   Smokeless tobacco: Never   Tobacco comments:    1/2 pk daily 05/05/2024  Vaping Use   Vaping status: Never Used  Substance and Sexual Activity   Alcohol use: No   Drug use: No   Sexual activity: Yes    Birth control/protection: None  Other Topics Concern   Not on file  Social History Narrative   Not on file   Social Drivers of Health   Financial Resource Strain: Not on file  Food Insecurity: No Food Insecurity (06/27/2022)   Hunger Vital Sign    Worried About Running Out of Food in the Last Year: Never true    Ran  Out of Food in the Last Year: Never true  Transportation Needs: No Transportation Needs (06/27/2022)   PRAPARE - Administrator, Civil Service (Medical): No    Lack of Transportation (Non-Medical): No  Physical Activity: Not on file  Stress: Not on file  Social Connections: Not on file  Intimate Partner Violence: Not At Risk (06/27/2022)   Humiliation, Afraid, Rape, and Kick questionnaire    Fear of Current or Ex-Partner: No    Emotionally Abused: No    Physically Abused: No    Sexually Abused: No    Allergies  Allergen Reactions   Cyclobenzaprine  Swelling   Decadron  [Dexamethasone ] Swelling  and Other (See Comments)    Severe hiccups lasting 15 days   Penicillins Swelling, Rash and Other (See Comments)    Did it involve swelling of the face/tongue/throat, SOB, or low BP? Yes Did it involve sudden or severe rash/hives, skin peeling, or any reaction on the inside of your mouth or nose? No Did you need to seek medical attention at a hospital or doctor's office? No When did it last happen?      year  If all above answers are NO, may proceed with cephalosporin use.     Tamsulosin     Other Reaction(s): Not available  tamsulosin    Current Outpatient Medications on File Prior to Visit  Medication Sig Dispense Refill   acetaminophen  (TYLENOL ) 500 MG tablet Take 1-2 tablets (500-1,000 mg total) by mouth every 6 (six) hours as needed for mild pain or moderate pain.  0   amLODipine  (NORVASC ) 10 MG tablet Take 10 mg by mouth daily.   0   aspirin  EC 81 MG tablet Daily Daily     atorvastatin (LIPITOR) 10 MG tablet Take 1 tablet every day by oral route.     HYDROcodone -acetaminophen  (NORCO) 7.5-325 MG tablet TAKE 1 TABLET BY MOUTH EVERY 6 HOURS (MAX OF 5 DAYS)     lisinopril  (ZESTRIL ) 20 MG tablet Take 20 mg by mouth daily.     meloxicam  (MOBIC ) 7.5 MG tablet Take 1 tablet (7.5 mg total) by mouth daily. 30 tablet 5   potassium chloride  SA (KLOR-CON  M) 20 MEQ tablet Take 1 tablet (20 mEq total) by mouth 2 (two) times daily for 5 days. 10 tablet 0   tadalafil  (CIALIS ) 20 MG tablet Take 1 tablet (20 mg total) by mouth daily as needed. 90 tablet 3   varenicline (CHANTIX) 1 MG tablet Take 1 mg by mouth daily.     alfuzosin  (UROXATRAL ) 10 MG 24 hr tablet Take 1 tablet (10 mg total) by mouth daily with breakfast. (Patient not taking: Reported on 05/05/2024) 90 tablet 3   docusate sodium  (COLACE) 100 MG capsule take one capsule PO daily x 1 week, after that may take daily PRN for constipation take one capsule PO daily x 1 week, after that may take daily PRN for constipation (Patient not taking:  Reported on 05/05/2024)     levofloxacin  (LEVAQUIN ) 750 MG tablet Take 1 tablet (750 mg total) by mouth daily. Take 1 hour prior to your prostate biopsy procedure. (Patient not taking: Reported on 05/05/2024) 1 tablet 0   lisinopril -hydrochlorothiazide  (PRINZIDE ,ZESTORETIC ) 20-25 MG tablet Take 1 tablet by mouth daily. (Patient not taking: Reported on 05/05/2024)  0   metFORMIN  (GLUCOPHAGE ) 500 MG tablet Take 500 mg by mouth daily. (Patient not taking: Reported on 05/05/2024)     methocarbamol  (ROBAXIN ) 500 MG tablet Take 1 tablet (500 mg total) by mouth  every 6 (six) hours as needed for muscle spasms. (Patient not taking: Reported on 05/05/2024) 28 tablet 0   naloxone (NARCAN) nasal spray 4 mg/0.1 mL Place 1 spray into the nose once. (Patient not taking: Reported on 05/05/2024)     oxymetazoline (AFRIN) 0.05 % nasal spray Place 1 spray into both nostrils 2 (two) times daily as needed for congestion. (Patient not taking: Reported on 05/05/2024)     tiZANidine (ZANAFLEX) 2 MG tablet  (Patient not taking: Reported on 05/05/2024)     traZODone (DESYREL) 50 MG tablet Take 1 tablet every day by oral route at bedtime. (Patient not taking: Reported on 05/05/2024)     No current facility-administered medications on file prior to visit.       Objective:    Vitals:   05/05/24 1438  BP: (!) 100/58  Pulse: 85  Temp: 99.1 F (37.3 C)  SpO2: 95%  Weight: 187 lb 9.6 oz (85.1 kg)  Height: 6' 4 (1.93 m)   Physical Exam Gen: Pleasant, well-nourished, in no distress,  normal affect  ENT: No lesions,  mouth clear,  oropharynx clear, no postnasal drip  Neck: No JVD, no stridor  Lungs: No use of accessory muscles, no crackles or wheezing on normal respiration, slight wheeze on a forced exp  Cardiovascular: RRR, heart sounds normal, no murmur or gallops, no peripheral edema  Musculoskeletal: No deformities, no cyanosis or clubbing  Neuro: alert, awake, non focal  Skin: Warm, no lesions or rash      Assessment & Plan:   Assessment & Plan Mediastinal mass  Pulmonary nodule   Assessment and Plan Assessment & Plan Chronic obstructive pulmonary disease (COPD) with emphysema Bilateral pleuroparenchymal scarring and emphysema on CT. Reports nocturnal dyspnea. No prior inhaler use. Functional capacity remains good. - Prescribed albuterol  inhaler for evening symptoms. - Consider pulmonary function testing if symptoms worsen. He will need baseline PFT at some point going forward. - Encouraged smoking cessation.  Tobacco use disorder Long history of smoking, currently half a pack per day. Reports dyspnea when smoking at night. Desires to quit. - Encouraged reduction to five cigarettes per day with a quit date goal. - Provided smoking cessation support and strategies.  Pulmonary nodules 6 mm nodule in right upper lobe, stable on imaging. Continued surveillance warranted due to smoking history. - Order follow-up CT scan in June 2026.  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 Reports mild shortness of breath at night. - Monitor with serial imaging as needed.     Return in about 8 months (around 01/11/2025) for With Dr. Shelah, To review CT scan of the chest.  Lamar Shelah, MD, PhD 05/05/2024, 3:26 PM [facility] [phone] or if no answer before 7:00PM call [phone] For any issues after 7:00PM please call eLink [phone]

## 2024-05-05 NOTE — Patient Instructions (Signed)
  VISIT SUMMARY: Today, you were seen for a follow-up regarding your mediastinal mass and other related health concerns. We reviewed your recent CT scan and discussed your symptoms and smoking history.  YOUR PLAN: -CHRONIC OBSTRUCTIVE PULMONARY DISEASE (COPD) WITH EMPHYSEMA: COPD is a chronic lung disease that makes it hard to breathe. Emphysema is a type of COPD that involves damage to the air sacs in the lungs. You have been prescribed an albuterol  inhaler to help with your evening symptoms. If your symptoms get worse, we may consider doing pulmonary function tests. It's important to try to quit smoking to help manage this condition.  -TOBACCO USE DISORDER: Tobacco use disorder is a condition where you are dependent on smoking. You currently smoke half a pack per day and have expressed a desire to quit. We encourage you to reduce your smoking to five cigarettes per day and set a goal to quit completely. We have provided you with support and strategies to help you quit.  -PULMONARY NODULES: Pulmonary nodules are small growths in the lungs. You have a stable 6 mm nodule in your right upper lobe. Due to your smoking history, we will continue to monitor this with a follow-up CT scan in June 2026.  -ANTERIOR MEDIASTINAL MASS (LIKELY LYMPHATIC MALFORMATION): An anterior mediastinal mass is a growth in the area of the chest between the lungs. Your mass appears to be less prominent on the recent CT scan, suggesting it is likely benign. We will continue to monitor it with serial imaging as needed.  INSTRUCTIONS: Please follow up with a CT scan in June 2026 to monitor the pulmonary nodule. Continue using the albuterol  inhaler as prescribed for your evening symptoms. Work on reducing your smoking to five cigarettes per day and aim to quit completely. If your symptoms worsen, please contact our office for further evaluation.

## 2024-05-12 NOTE — Progress Notes (Signed)
Updated medical history.

## 2024-05-25 ENCOUNTER — Other Ambulatory Visit: Payer: Self-pay

## 2024-05-25 ENCOUNTER — Other Ambulatory Visit

## 2024-05-25 DIAGNOSIS — C61 Malignant neoplasm of prostate: Secondary | ICD-10-CM

## 2024-05-26 LAB — PSA, TOTAL AND FREE
PSA, Free Pct: 21.9 %
PSA, Free: 1.62 ng/mL
Prostate Specific Ag, Serum: 7.4 ng/mL — ABNORMAL HIGH (ref 0.0–4.0)

## 2024-05-27 ENCOUNTER — Encounter: Payer: Self-pay | Admitting: Internal Medicine

## 2024-05-27 ENCOUNTER — Ambulatory Visit: Attending: Internal Medicine | Admitting: Internal Medicine

## 2024-05-27 VITALS — BP 116/80 | HR 69 | Ht 76.0 in | Wt 190.0 lb

## 2024-05-27 DIAGNOSIS — Z136 Encounter for screening for cardiovascular disorders: Secondary | ICD-10-CM

## 2024-05-27 DIAGNOSIS — R9431 Abnormal electrocardiogram [ECG] [EKG]: Secondary | ICD-10-CM | POA: Insufficient documentation

## 2024-05-27 NOTE — Progress Notes (Signed)
 Cardiology Office Note  Date: 05/27/2024   ID: Charles, Evans 08/07/1955, MRN [id number]  PCP:  Shona Norleen PEDLAR, MD  Cardiologist:  Diannah SHAUNNA Maywood, MD Electrophysiologist:  None   History of Present Illness: Charles Evans is a 68 y.o. male known to have HTN was referred to cardiology clinic for evaluation of abnormal EKG.  I reviewed the EKG that showed NSR, RBBB.  I discussed this with the patient today.  Reassured.  Does not have any symptoms of angina or DOE.  He works as a curator.  Active at baseline.  No dizziness, lightheadedness, syncope, palpitations, leg swelling.  Past Medical History:  Diagnosis Date   Diabetes mellitus without complication (HCC)    Hypertension    Thyroid  disease     Past Surgical History:  Procedure Laterality Date   BACK SURGERY     COLONOSCOPY N/A 08/21/2015   Procedure: COLONOSCOPY;  Surgeon: Margo LITTIE Haddock, MD;  Location: [facility];  Service: Endoscopy;  Laterality: N/A;  1430   ESOPHAGOGASTRODUODENOSCOPY N/A 08/21/2015   Procedure: ESOPHAGOGASTRODUODENOSCOPY (EGD);  Surgeon: Margo LITTIE Haddock, MD;  Location: [facility];  Service: Endoscopy;  Laterality: N/A;   FEMUR IM NAIL Right 06/24/2022   Procedure: INTRAMEDULLARY (IM) RETROGRADE FEMORAL NAILING;  Surgeon: Kendal Franky SHAUNNA, MD;  Location: [facility] OR;  Service: Orthopedics;  Laterality: Right;   HERNIA REPAIR     left groin   LUMBAR LAMINECTOMY/DECOMPRESSION MICRODISCECTOMY Left 09/28/2018   Procedure: Microdiscectomy - L3-L4 - left;  Surgeon: Onetha Kuba, MD;  Location: [facility] OR;  Service: Neurosurgery;  Laterality: Left;  Microdiscectomy - L3-L4 - left    Current Outpatient Medications  Medication Sig Dispense Refill   acetaminophen  (TYLENOL ) 500 MG tablet Take 1-2 tablets (500-1,000 mg total) by mouth every 6 (six) hours as needed for mild pain or moderate pain.  0   albuterol  (VENTOLIN  HFA) 108 (90 Base) MCG/ACT inhaler Inhale 2 puffs into the lungs every 4 (four) hours as needed for  wheezing or shortness of breath. 8 g 2   amLODipine  (NORVASC ) 10 MG tablet Take 10 mg by mouth daily.   0   aspirin  EC 81 MG tablet Daily Daily     atorvastatin (LIPITOR) 10 MG tablet Take 1 tablet every day by oral route.     HYDROcodone -acetaminophen  (NORCO) 7.5-325 MG tablet TAKE 1 TABLET BY MOUTH EVERY 6 HOURS (MAX OF 5 DAYS)     lisinopril  (ZESTRIL ) 20 MG tablet Take 20 mg by mouth daily.     meloxicam  (MOBIC ) 7.5 MG tablet Take 1 tablet (7.5 mg total) by mouth daily. 30 tablet 5   potassium chloride  SA (KLOR-CON  M) 20 MEQ tablet Take 1 tablet (20 mEq total) by mouth 2 (two) times daily for 5 days. 10 tablet 0   tadalafil  (CIALIS ) 20 MG tablet Take 1 tablet (20 mg total) by mouth daily as needed. 90 tablet 3   No current facility-administered medications for this visit.   Allergies:  Cyclobenzaprine , Decadron  [dexamethasone ], Penicillins, and Tamsulosin   Social History: The patient  reports that he has been smoking cigarettes. He has a 20 pack-year smoking history. He has never used smokeless tobacco. He reports that he does not drink alcohol and does not use drugs.   Family History: The patient's family history includes Colon cancer in his cousin; Heart disease in his father.   ROS:  Please see the history of present illness. Otherwise, complete review of systems is positive for none  All other systems are reviewed and negative.   Physical Exam: VS:  BP 116/80 (BP Location: Right Arm, Cuff Size: Large)   Pulse 69   Ht 6' 4 (1.93 m)   Wt 190 lb (86.2 kg)   BMI 23.13 kg/m , BMI Body mass index is 23.13 kg/m.  Wt Readings from Last 3 Encounters:  05/27/24 190 lb (86.2 kg)  05/05/24 187 lb 9.6 oz (85.1 kg)  03/10/24 183 lb (83 kg)    General: Patient appears comfortable at rest. HEENT: Conjunctiva and lids normal, oropharynx clear with moist mucosa. Neck: Supple, no elevated JVP or carotid bruits, no thyromegaly. Lungs: Clear to auscultation, nonlabored breathing at  rest. Cardiac: Regular rate and rhythm, no S3 or significant systolic murmur, no pericardial rub. Abdomen: Soft, nontender, no hepatomegaly, bowel sounds present, no guarding or rebound. Extremities: No pitting edema, distal pulses 2+. Skin: Warm and dry. Musculoskeletal: No kyphosis. Neuropsychiatric: Alert and oriented x3, affect grossly appropriate.  Recent Labwork: 06/03/2023: Hemoglobin 14.7; Platelets 228 03/08/2024: ALT 19; AST 22; BUN 12; Creatinine 1.0; Potassium 3.5; Sodium 141 03/09/2024: TSH 0.03  No results found for: CHOL, TRIG, HDL, CHOLHDL, VLDL, LDLCALC, LDLDIRECT   Assessment and Plan:  Abnormal EKG: I reviewed the EKG that showed NSR, RBBB.  Benign finding.  Provided reassurance.  Paroxysmal A-fib: I reviewed all the EKGs on the EMR that showed benign findings except EKG from 2013 that showed A-fib with RVR.  It was a one-time finding with no recurrences since then.  No indication for systemic AC.  He denied having any symptoms either.  HTN, controlled: Continue current antihypertensives, amlodipine  10 mg once daily, lisinopril  20 mg once daily.  Follows with PCP.   30 minutes spent in reviewing prior medical records, more than 3 labs, discussion and documentation.  Medication Adjustments/Labs and Tests Ordered: Current medicines are reviewed at length with the patient today.  Concerns regarding medicines are outlined above.    Disposition:  Follow up prn  Signed Lekeisha Arenas Arleta Maywood, MD, 05/27/2024 3:16 PM    [facility] [address] [place], [place], [place] [address]

## 2024-05-27 NOTE — Patient Instructions (Signed)

## 2024-05-31 ENCOUNTER — Ambulatory Visit: Admitting: Urology

## 2024-06-01 ENCOUNTER — Ambulatory Visit: Payer: Self-pay | Admitting: Urology

## 2024-07-08 LAB — TSH: TSH: 0.04 u[IU]/mL — ABNORMAL LOW (ref 0.450–4.500)

## 2024-07-08 LAB — T3, FREE: T3, Free: 3.5 pg/mL (ref 2.0–4.4)

## 2024-07-08 LAB — T4, FREE: Free T4: 1.35 ng/dL (ref 0.82–1.77)

## 2024-07-12 ENCOUNTER — Telehealth: Payer: Self-pay | Admitting: Nurse Practitioner

## 2024-07-12 ENCOUNTER — Ambulatory Visit: Admitting: Nurse Practitioner

## 2024-07-12 DIAGNOSIS — E0590 Thyrotoxicosis, unspecified without thyrotoxic crisis or storm: Secondary | ICD-10-CM

## 2024-07-12 NOTE — Telephone Encounter (Signed)
 Pt came in to reschedule and is scheduled for March, gave him radiology number to call to get u/s scheduled.  Does he need new labs in march?  If so can you put order in?

## 2024-07-12 NOTE — Telephone Encounter (Signed)
 Yes, he will need them repeated.  I will enter them.

## 2024-09-24 ENCOUNTER — Ambulatory Visit: Admitting: Nurse Practitioner

## 2024-10-06 ENCOUNTER — Ambulatory Visit: Admitting: Urology

## 2024-11-01 ENCOUNTER — Other Ambulatory Visit (HOSPITAL_COMMUNITY)

## 2024-11-11 ENCOUNTER — Ambulatory Visit: Admitting: Oncology
